# Patient Record
Sex: Male | Born: 1945 | Race: White | Hispanic: No | Smoking: Former smoker
Health system: Western US, Academic
[De-identification: ages and names within clinical notes are randomized; demographics above are authoritative.]

## PROBLEM LIST (undated history)

## (undated) DIAGNOSIS — I739 Peripheral vascular disease, unspecified: Secondary | ICD-10-CM

## (undated) DIAGNOSIS — I10 Essential (primary) hypertension: Secondary | ICD-10-CM

## (undated) DIAGNOSIS — R319 Hematuria, unspecified: Secondary | ICD-10-CM

## (undated) DIAGNOSIS — I724 Aneurysm of artery of lower extremity: Secondary | ICD-10-CM

## (undated) DIAGNOSIS — L409 Psoriasis, unspecified: Secondary | ICD-10-CM

## (undated) DIAGNOSIS — F17200 Nicotine dependence, unspecified, uncomplicated: Secondary | ICD-10-CM

## (undated) DIAGNOSIS — J309 Allergic rhinitis, unspecified: Secondary | ICD-10-CM

## (undated) DIAGNOSIS — J449 Chronic obstructive pulmonary disease, unspecified: Secondary | ICD-10-CM

## (undated) HISTORY — DX: Essential (primary) hypertension: I10

## (undated) HISTORY — DX: Psoriasis, unspecified: L40.9

## (undated) HISTORY — DX: Nicotine dependence, unspecified, uncomplicated: F17.200

## (undated) HISTORY — DX: Allergic rhinitis, unspecified: J30.9

---

## 2013-04-09 ENCOUNTER — Encounter: Payer: Self-pay | Admitting: Internal Medicine

## 2013-04-09 ENCOUNTER — Ambulatory Visit: Payer: Medicare Other | Admitting: Internal Medicine

## 2013-04-09 VITALS — BP 140/84 | HR 86 | Temp 98.4°F | Resp 16 | Ht 74.0 in | Wt 180.0 lb

## 2013-04-09 DIAGNOSIS — F17200 Nicotine dependence, unspecified, uncomplicated: Secondary | ICD-10-CM | POA: Insufficient documentation

## 2013-04-09 DIAGNOSIS — R0982 Postnasal drip: Secondary | ICD-10-CM | POA: Insufficient documentation

## 2013-04-09 DIAGNOSIS — Z13220 Encounter for screening for lipoid disorders: Secondary | ICD-10-CM

## 2013-04-09 DIAGNOSIS — K219 Gastro-esophageal reflux disease without esophagitis: Secondary | ICD-10-CM | POA: Insufficient documentation

## 2013-04-09 DIAGNOSIS — Z125 Encounter for screening for malignant neoplasm of prostate: Secondary | ICD-10-CM

## 2013-04-09 DIAGNOSIS — Z139 Encounter for screening, unspecified: Secondary | ICD-10-CM

## 2013-04-09 MED ORDER — FLUTICASONE PROPIONATE 50 MCG/ACTUATION NASAL SPRAY,SUSPENSION: 2.0000 | Freq: Every day | NASAL | Status: DC

## 2013-04-09 NOTE — Nursing Note (Signed)
>>   Kenneth Gordon     Fri Apr 09, 2013  9:15 AM  I have asked the patient if they have received any medical treatment or consultation outside of the [facility].  The patient has indicated (no) to receiving consults or services outside the [facility].  If yes, we have requested information to ascertain these results.     Patient has not been to the doctor in 20 years.      Vital signs taken, allergies verified, screened for pain, med hx taken.   Kenneth Gordon, M.A.

## 2013-04-09 NOTE — Patient Instructions (Addendum)
Gastroesophageal Reflux Disease (GERD): After Your Visit  Your Care Instructions  Gastroesophageal reflux disease (GERD) is the backward flow of stomach acid into the esophagus. The esophagus is the tube that leads from your throat to your stomach. A one-way valve prevents the stomach acid from moving up into this tube. When you have GERD, this valve does not close tightly enough.  If you have mild GERD symptoms including heartburn, you may be able to control the problem with antacids or over-the-counter medicine. Changing your diet, losing weight, and making other lifestyle changes can also help reduce symptoms.  Follow-up care is a key part of your treatment and safety. Be sure to make and go to all appointments, and call your doctor if you are having problems. It¿s also a good idea to know your test results and keep a list of the medicines you take.  How can you care for yourself at home?  · Take your medicines exactly as prescribed. Call your doctor if you think you are having a problem with your medicine.  · Your doctor may recommend over-the-counter medicine. For mild or occasional indigestion, antacids, such as Tums, Gaviscon, Mylanta, or Maalox, may help. Your doctor also may recommend over-the-counter acid reducers, such as Pepcid AC, Tagamet HB, Zantac 75, or Prilosec. Read and follow all instructions on the label. If you use these medicines often, talk with your doctor.  · Change your eating habits.  ¨ It¿s best to eat several small meals instead of two or three large meals.  ¨ After you eat, wait 2 to 3 hours before you lie down.  ¨ Chocolate, mint, and alcohol can make GERD worse.  ¨ Spicy foods, foods that have a lot of acid (like tomatoes and oranges), and coffee can make GERD symptoms worse in some people. If your symptoms are worse after you eat a certain food, you may want to stop eating that food to see if your symptoms get better.   · Do not smoke or chew tobacco. Smoking can make GERD worse. If you need help quitting, talk to your doctor about stop-smoking programs and medicines. These can increase your chances of quitting for good.  · If you have GERD symptoms at night, raise the head of your bed 6 to 8 inches by putting the frame on blocks or placing a foam wedge under the head of your mattress. (Adding extra pillows does not work.)  · Do not wear tight clothing around your middle.  · Lose weight if you need to. Losing just 5 to 10 pounds can help.  When should you call for help?  Call your doctor now or seek immediate medical care if:  · You have new or different belly pain.  · Your stools are black and tarlike or have streaks of blood.  Watch closely for changes in your health, and be sure to contact your doctor if:  · Your symptoms have not improved after 2 days.  · Food seems to catch in your throat or chest.   Where can you learn more?   Go to https://www.healthwise.net/patiented  Enter T927 in the search box to learn more about "Gastroesophageal Reflux Disease (GERD): After Your Visit."   © 2006-2013 Healthwise, Incorporated. Care instructions adapted under license by [facility]. This care instruction is for use with your licensed healthcare professional. If you have questions about a medical condition or this instruction, always ask your healthcare professional. Healthwise, Incorporated disclaims any warranty or liability for your   use of this information.  Content Version: 9.7.145117; Last Revised: June 08, 2009              Stopping Smoking: After Your Visit  Your Care Instructions  Cigarette smokers crave the nicotine in cigarettes. Giving it up is much harder than simply changing a habit. Your body has to stop craving the nicotine. It is hard to quit, but you can do it. There are many tools that people use to quit smoking. You may find that combining tools works best for you.   There are several steps to quitting. First you get ready to quit. Then you get support to help you. After that, you learn new skills and behaviors to become a nonsmoker. For many people, a necessary step is getting and using medicine.  Your doctor will help you set up the plan that best meets your needs. You may want to attend a smoking cessation program to help you quit smoking. When you choose a program, look for one that has proven success. Ask your doctor for ideas. You will greatly increase your chances of success if you take medicine as well as get counseling or join a cessation program.  Some of the changes you feel when you first quit tobacco are uncomfortable. Your body will miss the nicotine at first, and you may feel short-tempered and grumpy. You may have trouble sleeping or concentrating. Medicine can help you deal with these symptoms. You may struggle with changing your smoking habits and rituals. The last step is the tricky one: Be prepared for the smoking urge to continue for a time. This is a lot to deal with, but keep at it. You will feel better.  Follow-up care is a key part of your treatment and safety. Be sure to make and go to all appointments, and call your doctor if you are having problems. Its also a good idea to know your test results and keep a list of the medicines you take.  How can you care for yourself at home?   Ask your family, friends, and coworkers for support. You have a better chance of quitting if you have help and support.   Join a support group, such as Nicotine Anonymous, for people who are trying to quit smoking.   Consider signing up for a smoking cessation program, such as the American Lung Association's Freedom from Smoking program.   Set a quit date. Pick your date carefully so that it is not right in the middle of a big deadline or stressful time. Once you quit, do not even take a puff. Get rid of all ashtrays and lighters after your last  cigarette. Clean your house and your clothes so that they do not smell of smoke.   Learn how to be a nonsmoker. Think about ways you can avoid those things that make you reach for a cigarette.   Avoid situations that put you at greatest risk for smoking. For some people, it is hard to have a drink with friends without smoking. For others, they might skip a coffee break with coworkers who smoke.   Change your daily routine. Take a different route to work or eat a meal in a different place.   Cut down on stress. Calm yourself or release tension by doing an activity you enjoy, such as reading a book, taking a hot bath, or gardening.   Talk to your doctor or pharmacist about nicotine replacement therapy, which replaces the nicotine in your body. You  still get nicotine but you do not use tobacco. Nicotine replacement products help you slowly reduce the amount of nicotine you need. These products come in several forms, many of them available over-the-counter:   Nicotine patches   Nicotine gum and lozenges   Nicotine inhaler   Ask your doctor about bupropion (Wellbutrin) or varenicline (Chantix), which are prescription medicines. They do not contain nicotine. They help you by reducing withdrawal symptoms, such as stress and anxiety.   Some people find hypnosis, acupuncture, and massage helpful for ending the smoking habit.   Eat a healthy diet and get regular exercise. Having healthy habits will help your body move past its craving for nicotine.   Be prepared to keep trying. Most people are not successful the first few times they try to quit. Do not get mad at yourself if you smoke again. Make a list of things you learned and think about when you want to try again, such as next week, next month, or next year.   Where can you learn more?   Go to [employer]  Enter [id number] in the search box to learn more about "Stopping Smoking: After Your Visit."     2006-2013 Healthwise, Incorporated. Care instructions adapted under license by [facility]. This care instruction is for use with your licensed healthcare professional. If you have questions about a medical condition or this instruction, always ask your healthcare professional. Healthwise, Incorporated disclaims any warranty or liability for your use of this information.  Content Version: 9.7.145117; Last Revised: October 11, 2009              Prostate-Specific Antigen (PSA) Test: About This Test  What is it?  A prostate-specific antigen (PSA) test measures the amount of PSA in your blood. PSA is released by a man's prostate gland into his blood. A high PSA level may mean that you have an enlargement, infection, or cancer of the prostate.  Why is this test done?  You may have this test to:   Check for prostate cancer.   Watch prostate cancer and see if treatment is working.  How can you prepare for the test?  Do not ejaculate during the 2 days before your PSA blood test, either during sex or masturbation.  What happens before the test?  Tell your doctor if you have had a:   Test to look at your bladder (cystoscopy) in the past several weeks.   Prostate biopsy in the past several weeks.   Prostate infection or urinary tract infection that has not gone away.   Tube (catheter) inserted into your bladder to drain urine recently.  What happens during the test?  A health professional takes a sample of your blood.  What happens after the test?  You can go back to your usual activities right away.  When should you call for help?  Watch closely for changes in your health, and be sure to contact your doctor if you have any questions about this test.  Follow-up care is a key part of your treatment and safety. Be sure to make and go to all appointments, and call your doctor if you are having problems. It's also a good idea to keep a list of the medicines you take.  Ask your doctor when you can expect to have your test results.   Where can you learn more?   Go to [employer]  Enter H964 in the search box to learn more about "Prostate-Specific Antigen (PSA) Test: About This  Test."    2006-2013 Healthwise, Incorporated. Care instructions adapted under license by [facility]. This care instruction is for use with your licensed healthcare professional. If you have questions about a medical condition or this instruction, always ask your healthcare professional. Healthwise, Incorporated disclaims any warranty or liability for your use of this information.  Content Version: 9.7.145117; Last Revised: September 20, 2010

## 2013-04-09 NOTE — Progress Notes (Signed)
SUBJECTIVE: Kenneth Gordon is a [age]yr old male is here to establish medical care.  Patient has never seen a physician in last 20 years  GERD the symptoms for which he takes over the counter antacids  Upper airway syndrome post nasal drip denies any other complaints of chest pain shortness of breath or cough  Tobacco use patient is trying to quit    Constitutional: negative.  Eyes: negative.  Ears, Nose, Mouth, Throat: negative.  CV: negative.  Resp: negative.  GI: negative.  GU: negative.  Integumentary: negative.  Neuro: negative.  Psych: Mood pt's report, euthymic.  Endo: negative.  Heme/Lymphatic: negative.  Or  Past Medical History   Diagnosis Date    NO SIGNIFICANT HISTORY (aka NONE)      Past Surgical History   Procedure Laterality Date    No surgical history       Family History   Problem Relation Age of Onset    Diabetes Son      History     Social History    Marital Status: N/A     Spouse Name: N/A     Number of Children: N/A    Years of Education: N/A     Occupational History    Not on file.     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     No current facility-administered medications on file prior to visit.     No Known Allergies        OBJECTIVE:  BP 140/84   Pulse 86   Temp(Src) 36.9 C (98.4 F) (Tympanic)   Resp 16   Ht 1.88 m (6\' 2" )   Wt 81.647 kg (180 lb)   BMI 23.1 kg/m2   SpO2 97%    Normal mood and good insight, without evidence of thought disorder or hallucination  General Appearance: healthy, alert, no distress, pleasant affect, cooperative.  Ears:  normal TMs and canal and external inspection of  ears show no abnormality.  Nose:  normal.  Mouth: normal.  Neck:  Neck supple. No adenopathy, thyroid symmetric, normal size.  Heart:  normal rate and regular rhythm, no murmurs, clicks, or gallops.  Lungs: clear to auscultation.  Abdomen: BS normal.  Abdomen soft, non-tender.  No masses or organomegaly.  Extremities:  no cyanosis, clubbing, or edema.  Neuro: Gait normal. Reflexes normal and symmetric. Sensation and strength grossly normal.  Mental Status: Appearance/Cooperation: well groomed and dressed  Attitude: pleasant   Behavior :normal  Eye Contact: normal  Attention Span: good  Speech: normal volume, rate, and pitch    ASSESSMENT:(784.91) PND (post-nasal drip)  (primary encounter diagnosis)  Comment: Diagnosis reviewed  Plan: We'll start him on Flonase and discussed with him the proper technique and the pros and cons of the medications    (530.81) GERD (gastroesophageal reflux disease)  Comment: Diagnosis reviewed and discussed some lifestyle changes  Plan: CBC AUTO + REFLEX MANUAL  DIFF        When necessary antacids on PPIs    (305.1) Tobacco dependency  Comment: Encouraged the patient in consult on quitting smoking  Plan: He'll try E. cigarettes    (V82.9) Screening  Comment: for dm anemia liver and kidney    Plan: CBC AUTO + REFLEX MANUAL DIFF, COMPREHENSIVE         METABOLIC PANEL, THYROID STIMULATING HORMONE,         PSA SCREEN, GI LAB REFERRAL            (V76.44) Prostate cancer screening      (V77.91) Screening for hyperlipidemia  Comment: Patient is a family history of cardiovascular disease  Plan: LIPID PANEL

## 2013-04-13 ENCOUNTER — Ambulatory Visit: Payer: Medicare Other | Attending: Internal Medicine

## 2013-04-13 DIAGNOSIS — Z13220 Encounter for screening for lipoid disorders: Secondary | ICD-10-CM | POA: Insufficient documentation

## 2013-04-13 DIAGNOSIS — K219 Gastro-esophageal reflux disease without esophagitis: Secondary | ICD-10-CM | POA: Insufficient documentation

## 2013-04-13 DIAGNOSIS — Z139 Encounter for screening, unspecified: Principal | ICD-10-CM | POA: Insufficient documentation

## 2013-04-13 LAB — CBC WITH DIFFERENTIAL
BASOPHILS % AUTO: 0.5 %
BASOPHILS ABS AUTO: 0 10*3/uL (ref 0–0.2)
EOSINOPHIL % AUTO: 2.4 %
EOSINOPHIL ABS AUTO: 0.2 10*3/uL (ref 0–0.5)
HEMATOCRIT: 48.9 % (ref 41–53)
HEMOGLOBIN: 16 g/dL (ref 13.5–17.5)
LYMPHOCYTE ABS AUTO: 1.9 10*3/uL (ref 1.0–4.8)
LYMPHOCYTES % AUTO: 21.9 %
MCH: 31.3 pg (ref 27–33)
MCHC: 32.8 % (ref 32–36)
MCV: 95.5 UM3 (ref 80–100)
MONOCYTES % AUTO: 8.4 %
MONOCYTES ABS AUTO: 0.7 10*3/uL (ref 0.1–0.8)
MPV: 8.2 UM3 (ref 6.8–10.0)
NEUTROPHIL ABS AUTO: 5.8 10*3/uL (ref 1.80–7.70)
NEUTROPHILS % AUTO: 66.8 %
PLATELET COUNT: 235 10*3/uL (ref 130–400)
RDW: 13.9 U (ref 0–14.7)
RED CELL COUNT: 5.12 10*6/uL (ref 4.5–5.9)
WHITE BLOOD CELL COUNT: 8.8 10*3/uL (ref 4.5–11.0)

## 2013-04-13 LAB — COMPREHENSIVE METABOLIC PANEL
ALANINE TRANSFERASE (ALT): 25 U/L (ref 6–63)
ALBUMIN: 3.5 g/dL (ref 3.2–4.6)
ALKALINE PHOSPHATASE (ALP): 69 U/L (ref 35–115)
ASPARTATE TRANSAMINASE (AST): 21 U/L (ref 15–43)
BILIRUBIN TOTAL: 0.5 mg/dL (ref 0.3–1.3)
CALCIUM: 9.3 mg/dL (ref 8.6–10.5)
CARBON DIOXIDE TOTAL: 34 meq/L — AB (ref 24–32)
CHLORIDE: 102 meq/L (ref 95–110)
CREATININE BLOOD: 0.85 mg/dL (ref 0.44–1.27)
GLUCOSE: 104 mg/dL — AB (ref 70–99)
POTASSIUM: 5 meq/L (ref 3.3–5.0)
PROTEIN: 6.6 g/dL (ref 6.3–8.3)
SODIUM: 142 meq/L (ref 135–145)
UREA NITROGEN, BLOOD (BUN): 11 mg/dL (ref 8–22)

## 2013-04-13 LAB — LIPID PANEL
CHOLESTEROL: 186 mg/dL (ref 0–200)
HDL CHOLESTEROL: 51 mg/dL (ref >=35)
LDL CHOLESTEROL CALCULATION: 117 mg/dL (ref <130)
NON-HDL CHOLESTEROL: 135 mg/dL (ref 0–160)
TOTAL CHOLESTEROL:HDL RATIO: 3.7 (ref <4.0)
TRIGLYCERIDE: 90 mg/dL (ref 35–160)

## 2013-04-13 LAB — THYROID STIMULATING HORMONE: THYROID STIMULATING HORMONE: 5.75 u[IU]/mL — AB (ref 0.35–3.30)

## 2013-04-13 LAB — PSA SCREEN: PSA SCREEN: 2.7 ng/mL (ref 0–4.0)

## 2013-04-19 ENCOUNTER — Encounter: Payer: Self-pay | Admitting: Internal Medicine

## 2013-04-19 NOTE — Telephone Encounter (Signed)
From: Kenneth Gordon  To: [name], MD  Sent: 04/19/2013 12:26 PM PST  Subject:  Non-urgent Medical Advice Question    Now  that my test results are in should I schedule a follow up appointment?    Thanks  in Advance.    Kenneth Puffobert  Gordon

## 2013-12-19 ENCOUNTER — Other Ambulatory Visit: Payer: Self-pay

## 2014-12-29 ENCOUNTER — Ambulatory Visit: Payer: Medicare Other

## 2014-12-29 VITALS — BP 150/88 | HR 84 | Temp 97.0°F | Ht 74.0 in | Wt 183.0 lb

## 2014-12-29 DIAGNOSIS — Z125 Encounter for screening for malignant neoplasm of prostate: Principal | ICD-10-CM

## 2014-12-29 DIAGNOSIS — K219 Gastro-esophageal reflux disease without esophagitis: Secondary | ICD-10-CM

## 2014-12-29 DIAGNOSIS — Z139 Encounter for screening, unspecified: Secondary | ICD-10-CM

## 2014-12-29 DIAGNOSIS — Z23 Encounter for immunization: Secondary | ICD-10-CM

## 2014-12-29 NOTE — Nursing Note (Signed)
Patient ID confirmed using 2 identifiers.    Vital signs taken, allergies verified, screened for pain, and med history taken.      Mammography screening for females between 50-68 years of age reviewed as appropriate.    Flu Vaccine status reviewed and updated as appropriate.     My Chart status reviewed and updated as appropriate.    I have asked the patient if they have received any medical treatment/consultations outside of the [facility] within the past 30 days. The patient has indicated no to receiving services outside of the [facility]. If yes, I have requested information to ascertain these results.     Jeanette McCrum, MA

## 2014-12-29 NOTE — Nursing Note (Signed)
The Influenza Vaccine VIS document for the flu injection was given to patient to review. Patient or person named in permission has answered no to any history of egg allergy, previous serious reaction to a influenza vaccine or current illness which would preclude them receiving an immunization. Any questions were referred to the physician. The Influenza Vaccine was then administered per protocol. The patient was observed for immediate reactions to the vaccine per protocol. None were observed.   Kenneth Gordon       Immunization VIS documentation(s) were given to patient  to review. All questions were answered and the patient consented to the Immunization(s) being given. Patient allergies were reviewed and no contraindications were found. The immunization(s) were given as ordered. The patient was observed for any immediate reactions to the vaccine. None were observed.ABN Done. Kenneth Gordon

## 2014-12-29 NOTE — Patient Instructions (Signed)
- - - - - - - - - - - - - - - Information on Prostate Cancer - - - - - - - - - - - - - - -    The prostate is a gland, about the size of a walnut, located under the bladder and surrounding the upper part of the urethra. The urethra is a tube that carries urine and semen through the penis to the outside of the body. The prostate gland is found only in men. It produces semen, the liquid that carries the sperm when a man ejaculates.    Prostate cancer starts in the cells of the prostate gland. When a man has prostate cancer, the prostate cancer cells grow into a tumor. These cancer cells can also spread into other parts of the body.    The most common symptom of prostate cancer is no symptom at all.     Risk Factors and Prevention:    While 1 man out of 6 will be diagnosed with prostate cancer during his lifetime, only 1 man in 34 will die of this disease. The death rate for prostate cancer is going down. And the disease is being found earlier as well.     Prostate cancer is a disease that is often associated with aging. About 75 percent of men are aged 65 years and older when diagnosed with prostate cancer.     African American men are at especially high risk for prostate cancer: They are over 50 percent more likely to develop this disease than non-Hispanic white men. African American men are twice as likely to die of this disease. Prostate cancer occurs less often in Asian men than in whites.    Men with close family members (father or brother) who have had prostate cancer are more likely to get it themselves, especially if their relatives were young when they got the disease.     In [place], approximately 18,865 men will be diagnosed with prostate cancer in 2007, 3,010 men will die from it and 114,600 men will continue living with it.     Because we don't know the exact cause of prostate cancer, it is not possible to give good advice about prevention. Some experts think that diet can reduce the risk of  developing prostate cancer by eating less red meat and fat, and eating more fruits, vegetables and grains.    Prostate Cancer Screening Options:    Prostate cancer can often be found early by testing the amount of Prostate-Specific Antigen (PSA) in a man's blood. PSA is a substance made by the normal prostate gland. Although PSA is mostly found in semen, a small amount is also found in the blood. There is no question that the PSA test can help spot prostate cancer. But it can't tell how dangerous the cancer is.    Another way to find prostate cancer is the Digital Rectal Exam (DRE). To do the DRE, the doctor inserts a gloved, lubricated finger into the rectum to feel for any irregular or firm areas that might be cancer. The DRE is less effective than the PSA blood test in finding prostate cancer, but it can sometimes find cancers in men with normal PSA levels. For this reason, the American Cancer Society (ACS) recommends that when prostate cancer screening is done, both the DRE and the PSA should be used.     These tests are not always accurate, however. Wrong test results could lead to excess worry, or even an   unneeded biopsy or other tests. Until more is known, one should talk to his doctor about whether or not he needs to be tested. Things to take into account are age and health. If one is young and gets prostate cancer, it will probably shorten his life if it is not caught early. But if one is older or in poor health, then prostate cancer may never become a major problem because it often grows so slowly.     ACS believes that doctors should offer the PSA blood test and DRE yearly, beginning at age 50 to men who do not have any major medical problems and can be expected to live at least 10 more years. Men at high risk should begin testing at age 45. Men at high risk include African Americans and men who have a close relative who had prostate cancer before age 65. Men at even higher risk (have several close  relatives with prostate cancer at an early age) could begin testing at age 40.     If certain symptoms or the results of early tests suggest one might have prostate cancer, the doctor will use further tests to find out whether the disease is present.    The prostate biopsy is the only way to know for sure if a man has prostate cancer. During a biopsy, tissue from a man's prostate is removed so it can be sent to the lab to see if there are cancer cells. A core needle biopsy is the main method used.    Cancer Treatment:    Most of the time, prostate cancer grows slowly. Autopsy studies show that many older men who died of other diseases also had prostate cancer that neither they nor their doctor were aware of. But sometimes prostate cancer can grow and spread quickly. Even with the latest methods, it is hard to tell which prostate cancers will grow slowly and which will grow quickly.     If cancer is present, the biopsy sample will be graded. Grading the cancer helps to predict how fast the cancer is likely to grow and spread. A staging system is a way to describe the extent to which the cancer has spread.    Surgery, radiation, and hormone therapy are the most common treatments for prostate cancer. Chemotherapy may be used in some cases, and watchful waiting, though not an active form of treatment, may also be used.    There are risks and significant side effects with all forms of treatment for prostate cancer. They include impotence, sterility, and incontinence.    Several factors should be taken into account before one chooses a course of treatment. These factors include age, overall health, goals for treatment, and one's feelings about side effects, risks and complications of treatment.. Some men, for example, can't imagine living with side effects such as incontinence or impotence. Others are less concerned about these and more concerned about getting rid of the cancer.  It is often helpful to discuss treatment  options with more than one doctor. Many men find that talking to others who have faced the same issues is helpful.    Prostate Cancer Survival Rates:    Overall, 99 percent of men diagnosed with prostate cancer survive at least 5 years. Ninety one percent of all prostate cancers are found while they are still within the prostate or only in nearby areas. Nearly 100 percent of these men survive at least 5 years. For the men whose cancer has already spread to   distant parts of the body when it is found, 34 percent will survive at least 5 years.    Sources:  1. American Cancer Society, Overview: Prostate Cancer. Available @ www.cancer.org. Accessed:  January 2007  2. IMPACT, What Every Man Should Know About Prostate Cancer? Brochure.  Available @ http://www.[place]-impact.org/public/genlbro.pdf . Accessed: January 2007.  3. American Cancer Society, [place] Division, and Public health Institute, [place] Cancer  Registry, [place] Cancer Facts & Figures 2007, [place], [place]. September 2006

## 2014-12-29 NOTE — Progress Notes (Signed)
Kenneth Gordon  presents for prostate check .  His check was 1 year(s) ago. In general he has been feeling well and functioning well at home, work, and personal relationships.Marland KitchenHe is still smoking but has cut back he is not interested in setting quit date.     Prostate exams have been normal.     Immunization History   Administered Date(s) Administered    (Prevnar 13; PCV13) Pneumococcal Vaccine, Conjugate 12/29/2014    Influenza Vaccine, Quadrivalent (Fluzone/Fluarix) 12/29/2014    Influenza Virus Vaccine, Split (0.5 Ml) 10/23/2012       Family history is significant for unchanged    Sexual History: is heterosexual and monogamous    Social history: I  did review and update substance use history form.  His routine exercise includes walking swimming , and his diet is typical American.   CAGE questions positive for no to all    Safety issues:  The patient does routinely use seatbelts.  He does  have smoke detectors, with biannual power checks, in his house. .     ROS:  He denies constitutional symptoms of fatigue, weakness, weight loss or gain, fevers, night sweats. The patient denies changes in visual acuity, diplopia or amaurosis, no discharge, matting, redness, tearing or eye pain. He denies a history of hearing loss, ear pain, tinnitus or aural discharge. Patient denies sore throat, dental pain, hoarseness, dysphagia, oral or tongue lesions. No symptoms of hypo or hyperthyroidism: no decreased or increased weight, no feeling cold/chilly or excessively warm, no diarrhea or constipation, no undue sweatiness, anxiety or palpitations. The patient denies cough, chest pain, dyspnea, wheezing or hemoptysis. Patient denies any exertional chest pain, dyspnea, palpitations, syncope, orthopnea, edema or paroxysmal nocturnal dyspnea. The patient denies abdominal or flank pain, anorexia, nausea or vomiting, dysphagia, change in bowel habits or black or bloody stools or weight loss. He denies urethral discharge,  dysuria, hematuria or sores on the genitals. No lumps or pain in the testicles. The patient denies urinary hesitancy, nocturia, frequency or incomplete voiding. He has noted no swollen glands in neck, axillary or inguinal areas, no fevers, weight loss, night sweats or pruritis. There is no family history of lymphoreticular neoplasia. The patient denies any symptoms of neurological impairment or TIA's; no amaurosis, diplopia, dysphasia, or unilateral disturbance of motor or sensory function. No loss of balance or vertigo. No personal or family history of skin cancers or melanoma.     Exam. BP 150/88  Pulse 84  Temp 36.1 C (97 F) (Tympanic)  Ht 1.88 m ( )  Wt 83 kg (183 lb)  SpO2 97%  BMI 23.5 kg/m2   General Appearance: healthy, alert, no distress, pleasant affect, cooperative.  Eyes:  conjunctivae and corneas clear. PERRL, EOM's intact. sclerae normal.  Ears:  normal TMs and canal and external inspection of ears show no abnormality.  Nose:  normal.  Mouth: normal.  Neck:  Neck supple. No adenopathy, thyroid symmetric, normal size.  Heart:  normal rate and regular rhythm, no murmurs, clicks, or gallops.  Lungs: clear to auscultation.  Breast:  negative.  Abdomen: BS normal.  Abdomen soft, non-tender.  No masses or organomegaly.  Extremities:  no cyanosis, clubbing, or edema.  Skin:  Skin color, texture, turgor normal. No rashes or lesions.  Genital Exam: Penis normal. No urethral discharge. Scrotum normal to palpation. No hernia.  Rectal: negative, stool guaiac negative and prostate 1+.nontender no nodule   Neuro: Gait normal. Reflexes normal and symmetric. Sensation and strength grossly  normal.  Mental Status: PHQ 2 is 0   ASSESSMENT:   (Z12.5) Prostate cancer screening  (primary encounter diagnosis)  Comment: DRE with mild BPH   Plan: PSA SCREEN        The natural history of prostate cancer and ongoing controversy regarding screening and potential treatment outcomes of prostate cancer has been discussed with  the patient. The meaning of a false positive PSA and a false negative PSA has been discussed. He indicates understanding of the limitations of this screening test and wishes  to proceed with screening PSA testing.     (Z23) Need for pneumococcal vaccination  Comment: update   Plan: PNEUMOCOCCAL CONJ VACCINE 13 VALENT IM        With this seasons influenza     (K21.9) Gastroesophageal reflux disease without esophagitis  Comment: quiescent off medication   Plan: CBC WITH DIFFERENTIAL        Follow up reucrrent     (Z13.9) Screening  Comment: glucose and lipids   Plan: LIPID PANEL WITH DLDL REFLEX, COMPREHENSIVE         METABOLIC PANEL, CBC WITH DIFFERENTIAL, TSH         WITH FREE T4 REFLEX        Per above   It is very important that he quit smoking. There are various alternative available to help with this difficult task, but first and foremost, he must make a firm commitment and decision to quit. The nature of nicotine addiction is discussed. The usefulness of behavioral therapy is discussed and suggested.  The correct use, cost and side effects of nicotine replacement therapy such as gum or patches is discussed. Zyban and it's cost and side effects are reviewed. The quit rates are discussed. I recommend he not allow costs of treatment to deter him from using NRT or Zyban, as the long term economic and health benefits are obvious. chantix discussed   I did review patient's past medical and family/social history.  Barriers to Learning assessed: none. Patient verbalizes understanding of teaching and instructions.    Electronically signed by   Ivan Anchors. Malie Kashani MD   Associate Medical Director  [facility], [facility]  [address]  [place], [address]  [phone]

## 2014-12-30 ENCOUNTER — Ambulatory Visit: Payer: Medicare Other

## 2014-12-30 DIAGNOSIS — K219 Gastro-esophageal reflux disease without esophagitis: Secondary | ICD-10-CM | POA: Insufficient documentation

## 2014-12-30 DIAGNOSIS — Z125 Encounter for screening for malignant neoplasm of prostate: Principal | ICD-10-CM | POA: Insufficient documentation

## 2014-12-30 DIAGNOSIS — Z139 Encounter for screening, unspecified: Secondary | ICD-10-CM | POA: Insufficient documentation

## 2014-12-30 LAB — CBC WITH DIFFERENTIAL
BASOPHILS % AUTO: 0.6 %
BASOPHILS ABS AUTO: 0 10*3/uL (ref 0–0.2)
EOSINOPHIL % AUTO: 2 %
EOSINOPHIL ABS AUTO: 0.2 10*3/uL (ref 0–0.5)
HEMATOCRIT: 49.6 % (ref 41–53)
HEMOGLOBIN: 16.5 g/dL (ref 13.5–17.5)
LYMPHOCYTE ABS AUTO: 1.1 10*3/uL (ref 1.0–4.8)
LYMPHOCYTES % AUTO: 13.6 %
MCH: 31.2 pg (ref 27–33)
MCHC: 33.3 % (ref 32–36)
MCV: 93.8 UM3 (ref 80–100)
MONOCYTES % AUTO: 8.2 %
MONOCYTES ABS AUTO: 0.7 10*3/uL (ref 0.1–0.8)
MPV: 8.2 UM3 (ref 6.8–10.0)
NEUTROPHIL ABS AUTO: 6.2 10*3/uL (ref 1.80–7.70)
NEUTROPHILS % AUTO: 75.6 %
PLATELET COUNT: 197 10*3/uL (ref 130–400)
RDW: 13.9 U (ref 0–14.7)
RED CELL COUNT: 5.29 10*6/uL (ref 4.5–5.9)
WHITE BLOOD CELL COUNT: 8.2 10*3/uL (ref 4.5–11.0)

## 2014-12-30 LAB — COMPREHENSIVE METABOLIC PANEL
ALANINE TRANSFERASE (ALT): 16 U/L (ref 6–63)
ALBUMIN: 3.8 g/dL (ref 3.2–4.6)
ALKALINE PHOSPHATASE (ALP): 49 U/L (ref 35–115)
ASPARTATE TRANSAMINASE (AST): 20 U/L (ref 15–43)
BILIRUBIN TOTAL: 0.9 mg/dL (ref 0.3–1.3)
CALCIUM: 8.8 mg/dL (ref 8.6–10.5)
CARBON DIOXIDE TOTAL: 30 meq/L (ref 24–32)
CHLORIDE: 100 meq/L (ref 95–110)
CREATININE BLOOD: 0.97 mg/dL (ref 0.44–1.27)
GLUCOSE: 89 mg/dL (ref 70–99)
POTASSIUM: 4.3 meq/L (ref 3.3–5.0)
PROTEIN: 6.5 g/dL (ref 6.3–8.3)
SODIUM: 139 meq/L (ref 135–145)
UREA NITROGEN, BLOOD (BUN): 17 mg/dL (ref 8–22)

## 2014-12-30 LAB — LIPID PANEL WITH DLDL REFLEX
CHOLESTEROL: 182 mg/dL (ref 0–200)
HDL CHOLESTEROL: 44 mg/dL (ref >=35)
LDL CHOLESTEROL CALCULATION: 123 mg/dL (ref <130)
NON-HDL CHOLESTEROL: 138 mg/dL (ref 0–160)
TOTAL CHOLESTEROL:HDL RATIO: 4.1 — AB (ref <4.0)
TRIGLYCERIDE: 77 mg/dL (ref 35–160)

## 2014-12-30 LAB — TSH WITH FREE T4 REFLEX: THYROID STIMULATING HORMONE: 3.73 u[IU]/mL — AB (ref 0.35–3.30)

## 2014-12-30 LAB — THYROXINE, FREE (FREE T4): THYROXINE, FREE (FREE T4): 0.91 ng/dL (ref 0.56–1.64)

## 2014-12-30 LAB — PSA SCREEN: PSA SCREEN: 0.6 ng/mL (ref 0–4.0)

## 2015-03-02 DIAGNOSIS — Z1211 Encounter for screening for malignant neoplasm of colon: Principal | ICD-10-CM

## 2015-03-02 NOTE — Telephone Encounter (Signed)
From: Renard Hamperobert G Batts  To: Charline BillsPaul Grant Seites, MD  Sent: 03/02/2015 8:46 AM PST  Subject: Non-urgent Medical Advice Question    Hi Dr. Caryl AdaSeites    My significant other wants me to schedule and upper GI Test and colonoscopy as a base line. Can I schedule one for January 12-19, 2017?

## 2016-04-18 ENCOUNTER — Ambulatory Visit
Admission: RE | Admit: 2016-04-18 | Discharge: 2016-04-18 | Disposition: A | Discharge status: Home / Self Care | Payer: Medicare Other | Source: Ambulatory Visit | Attending: Family Medicine | Admitting: Family Medicine

## 2016-04-18 ENCOUNTER — Encounter: Payer: Self-pay | Admitting: Family Medicine

## 2016-04-18 ENCOUNTER — Ambulatory Visit (INDEPENDENT_AMBULATORY_CARE_PROVIDER_SITE_OTHER)
Admission: RE | Admit: 2016-04-18 | Discharge: 2016-04-18 | Disposition: A | Discharge status: Home / Self Care | Payer: Medicare Other | Source: Ambulatory Visit | Attending: Family Medicine | Admitting: Family Medicine

## 2016-04-18 ENCOUNTER — Ambulatory Visit (INDEPENDENT_AMBULATORY_CARE_PROVIDER_SITE_OTHER): Payer: Medicare Other | Admitting: Family Medicine

## 2016-04-18 VITALS — BP 146/76 | HR 94 | Temp 97.4°F | Resp 20 | Wt 185.6 lb

## 2016-04-18 DIAGNOSIS — F17200 Nicotine dependence, unspecified, uncomplicated: Principal | ICD-10-CM

## 2016-04-18 DIAGNOSIS — I10 Essential (primary) hypertension: Principal | ICD-10-CM

## 2016-04-18 DIAGNOSIS — Z23 Encounter for immunization: Secondary | ICD-10-CM

## 2016-04-18 DIAGNOSIS — L409 Psoriasis, unspecified: Secondary | ICD-10-CM

## 2016-04-18 DIAGNOSIS — J3089 Other allergic rhinitis: Secondary | ICD-10-CM

## 2016-04-18 DIAGNOSIS — R2242 Localized swelling, mass and lump, left lower limb: Principal | ICD-10-CM

## 2016-04-18 DIAGNOSIS — R0609 Other forms of dyspnea: Secondary | ICD-10-CM

## 2016-04-18 DIAGNOSIS — I724 Aneurysm of artery of lower extremity: Secondary | ICD-10-CM

## 2016-04-18 MED ORDER — LISINOPRIL 20 MG TABLET
20.0000 mg | ORAL_TABLET | Freq: Every day | ORAL | 1 refills | Status: DC
Start: 2016-04-18 — End: 2016-05-20

## 2016-04-18 MED ORDER — CLOBETASOL 0.05 % TOPICAL OINTMENT
TOPICAL_OINTMENT | TOPICAL | 2 refills | Status: DC
Start: 2016-04-18 — End: 2017-05-09

## 2016-04-18 NOTE — Progress Notes (Signed)
Chief Complaint: Follow up blood pressure; establish care    Renard HamperRobert G Nathanson is a 71yr old male with history of tobacco dependence, allergic rhinitis and psoriasis who presents to establish care with me and discuss elevated blood pressures. He has been keeping track with a phone app and his readings have been high. Denies HA, palpitations, CP but does endorse DOE recently when going up stairs.  Also for at least the last 6 months he has had a mass grow behind his left knee with surrounding swelling. No pain. No trauma to the area.      REVIEW OF SYSTEMS:  Constitutional: negative.  Ears, Nose, Mouth, Throat: post nasal drip, rhinorrhea.  CV: lower extremity edema.  Resp: cough, dyspnea on exertion.  GI: negative.  GU: negative.  Musculoskeletal: left popliteal mass.  Neuro: negative.    I did review patient's past medical and family/social history, no changes noted.    CURRENT MEDICATIONS:  No current outpatient prescriptions on file.     No current facility-administered medications for this visit.        PHYSICAL EXAM:  BP 146/76  Pulse 94  Temp 36.3 C (97.4 F) (Tympanic)  Resp 20  Wt 84.2 kg (185 lb 10 oz)  SpO2 97%  BMI 23.83 kg/m2  General Appearance: healthy, alert, no distress, pleasant affect, cooperative.  Neck:  Neck supple. No adenopathy, thyroid symmetric, normal size.  Heart:  normal rate and regular rhythm, no murmurs, clicks, or gallops.  Lungs: clear to auscultation.  Extremities:  no cyanosis, clubbing, or edema.  Musculoskeletal: Firm, immobile mass left popliteal fossa; nontender; surrounding varicosities; no erythema; trace local edema   Skin: multiple erythematous plaques with silver scale on skin of knees, thighs, legs, buttocks, low back; mild involvement behind ears.    ASSESSMENT & PLAN:  (I10) Essential hypertension  (primary encounter diagnosis)  Comment: New dx  Plan: Rx Lisinopril 40mg  (0.5 tab daily) with option to increase to 1 tab daily.    (J30.89) Chronic non-seasonal  allergic rhinitis, unspecified trigger  Comment: Using Dym[place] from [place]  Plan: Nasal steroid technique demonstrated; nasal irrigation handout given    (F17.200) Tobacco dependence  Comment: 1/2 ppd  Plan: DX CHEST 2 VIEWS        In light of DOE will get CXR    (L40.9) Psoriasis  Comment: Using coal tar at this time; acute flare  Plan: Clobetasol (TEMOVATE) 0.05 % Ointment        Rx temovate    (R06.09) Dyspnea on exertion  Comment: New onset in a chronic smoker; likely COPD dx  Plan: DX CHEST 2 VIEWS        CXR    (R22.42) Mass of left lower leg  Comment: Popliteal  Plan: US SOFT TISSUE LOWER EXTREMITY LEFT        Will get US of the area initially          Barriers to Learning assessed: none. Patient verbalizes understanding of teaching and instructions.

## 2016-04-18 NOTE — Nursing Note (Signed)
The Influenza Vaccine VIS document for the flu injection was given to patient to review. Patient or person named in permission has answered no to any history of egg allergy, previous serious reaction to a influenza vaccine or current illness which would preclude them receiving an immunization. Any questions were referred to the physician. The Influenza Vaccine was then administered per protocol. The patient was observed for immediate reactions to the vaccine per protocol. None were observed.   Kenneth Gordon

## 2016-04-18 NOTE — Nursing Note (Signed)
Patient ID confirmed using 2 identifiers.    Vital signs taken, allergies verified, screened for pain, and med history taken.     Mammography screening for females between 50-70 years of age reviewed as appropriate.    Flu Vaccine status reviewed and updated as appropriate.      My Chart status reviewed and updated as appropriate.    I have asked the patient if they have received any medical treatment/consultations outside of the [facility] within the past 30 days.  The patient has indicated no to receiving services outside of the [facility].  If yes, I have requested information to ascertain these results

## 2016-04-18 NOTE — Addendum Note (Signed)
Addended by: Orlena SheldonACTON, Brentley Horrell on: 04/18/2016 01:32 PM     Modules accepted: Orders

## 2016-04-19 ENCOUNTER — Ambulatory Visit
Admission: RE | Admit: 2016-04-19 | Discharge: 2016-04-19 | Disposition: A | Discharge status: Home / Self Care | Payer: Medicare Other | Source: Ambulatory Visit | Attending: Vascular Surgery | Admitting: Vascular Surgery

## 2016-04-19 DIAGNOSIS — I724 Aneurysm of artery of lower extremity: Secondary | ICD-10-CM

## 2016-04-19 DIAGNOSIS — R2242 Localized swelling, mass and lump, left lower limb: Principal | ICD-10-CM | POA: Insufficient documentation

## 2016-04-19 DIAGNOSIS — I70212 Atherosclerosis of native arteries of extremities with intermittent claudication, left leg: Secondary | ICD-10-CM

## 2016-04-19 NOTE — Addendum Note (Signed)
Addended by: Orlena SheldonACTON, Nico Syme on: 04/19/2016 10:50 AM     Modules accepted: Orders

## 2016-04-22 ENCOUNTER — Encounter: Payer: Self-pay | Admitting: GEN VASCULAR SURG

## 2016-04-22 ENCOUNTER — Encounter: Payer: Self-pay | Admitting: Family Medicine

## 2016-04-22 ENCOUNTER — Ambulatory Visit: Payer: Medicare Other | Attending: GEN VASCULAR SURG | Admitting: GEN VASCULAR SURG

## 2016-04-22 VITALS — BP 146/77 | HR 96 | Resp 18 | Wt 182.3 lb

## 2016-04-22 DIAGNOSIS — Z720 Tobacco use: Secondary | ICD-10-CM | POA: Insufficient documentation

## 2016-04-22 DIAGNOSIS — I724 Aneurysm of artery of lower extremity: Secondary | ICD-10-CM | POA: Insufficient documentation

## 2016-04-22 DIAGNOSIS — F17200 Nicotine dependence, unspecified, uncomplicated: Secondary | ICD-10-CM | POA: Insufficient documentation

## 2016-04-22 NOTE — Nursing Note (Signed)
Vital signs taken, allergies verified, screened for pain,  Salvadore Valvano, MA

## 2016-04-22 NOTE — Progress Notes (Signed)
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 He has had a chronic left popliteal fossae mass for several years and recently requested evaluation of this mass. 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    He has not had any episodes of sudden onset lower extremity pain. No calf or leg pain with ambulation. No numbness or weakness of the distal left leg.  He does have fairly significant COPD and is out of breath just walking in from the waiting area; this improved with his inhaler.    Current smoker 2 ppd x 50-55 yrs = 100 pack years.    History of psoriasis  Denies family history of aneurysm.    Review of Systems   Constitution: Negative for chills, decreased appetite, fever, weakness, malaise/fatigue, weight gain and weight loss.   HENT: Negative for congestion, headaches, hearing loss and sore throat.    Eyes: Negative for blurred vision, discharge, double vision, redness, vision loss in left eye, vision loss in right eye and visual disturbance.   Cardiovascular: Positive for dyspnea on exertion. Negative for chest pain, claudication, orthopnea and syncope.   Respiratory: Positive for cough, shortness of breath, sputum production and wheezing. Negative for hemoptysis.    Endocrine: Negative for cold intolerance and heat intolerance.   Skin: Positive for dry skin, flushing, itching and rash.   Musculoskeletal: Positive for joint pain. Negative for arthritis, back pain, muscle cramps and muscle weakness.   Gastrointestinal: Positive for constipation. Negative for bloating, abdominal pain, anorexia, change in bowel habit, bowel incontinence, diarrhea,  dysphagia, excessive appetite, heartburn, hematemesis, hematochezia, melena, nausea and vomiting.   Genitourinary: Negative for bladder incontinence, dysuria, frequency, hematuria, hesitancy and urgency.   Neurological: Negative for brief paralysis, focal weakness, numbness, seizures and sensory change.         PMHx/PSHx:  Past Medical History:   Diagnosis Date    Allergic rhinitis     Essential hypertension     NO SIGNIFICANT HISTORY (aka NONE)     Psoriasis     Tobacco dependence        Past Surgical History:   Procedure Laterality Date    NO SURGICAL HISTORY         Meds:  Current Outpatient Prescriptions   Medication Sig Dispense Refill    Clobetasol (TEMOVATE) 0.05 % Ointment Use sparingly twice a day to affected area. 60 g 2    Lisinopril (PRINIVIL, ZESTRIL) 20 mg Tablet Take 1 tablet by mouth every day. (blood pressure) 90 tablet 1     No current facility-administered medications for this visit.        Allergies:  Review of patient's allergies indicates no known allergies.    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   Cardiac:  Regular rhythm.  Lungs:  Bilateral expiratory wheezes.   Abdomen:  Soft. Non-tender. Non-distended. No palpable masses.  Extremities: BLE No edema or wounds.  Mild pre-tibial hyperpigmentation. Full ROM, sensation and 5/5 strength in UE and LE.  Ambulates without difficulty.   Groin: No rashes or lesions or scars.  Vascular Exam: Radial Femoral Popliteal PT DP ABI   RIGHT 2 Normal 2 Normal  No Bruit 2 Normal 2 Normal 2 Normal 1.15   LEFT 2 Normal 2 Normal  No Bruit 2 Normal 0 Absent, Doppler Signal:  yes 0 Absent, Doppler Signal:  yes 0.85        STUDIES  BLE arterial duplex US 04/19/16   Impression:  Patent supra and infra-renal aorta without evidence of aneurysm. The bilateral iliac arteries are not seen on today's study.  Patent bilateral common femoral arteries that are not aneurysmal.  Patent right popliteal artery without aneurysm.  >50% stenosis of the left distal superficial femoral artery  6 cm left popliteal artery aneurysm.  Normal arterial perfusion to the bilateral lower extremities at rest.    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 No claudication or distal sensorimotor symptoms, but reduced ABI and absent pulses.    Repair is indicated based on size and reduced ABI. Will obtain more detailed imaging with CTA and evaluate adequacy of veins for bypass conduit.    Notable medical issues include active smoking and significant COPD.     1. BMP  2. Smoking cessation referral  3. LLE CTA, BLE duplex US vein mapping.  4. Return to clinic 1 week (or this Friday) after CTA and US.  5. Patient seen and examined with the attending physician who is in agreement with the treatment plan.     Joelyn Omsonnor Keante Urizar, MD  Vascular Surgery Integrated Residency, PGY-3  PI# [phone], pager [phone]  Vascular Surgery service pager: [phone]

## 2016-04-22 NOTE — Telephone Encounter (Signed)
From: Kenneth Gordon G Chipley  To: [name], DO  Sent: 04/20/2016 11:03 AM PST  Subject: Test Result Question     What were the results of my test so far and why do I have an appointment on Monday with pulmonary clinic    Thanks in advance    [facility] LL[facility]t Gordon

## 2016-04-23 ENCOUNTER — Ambulatory Visit (HOSPITAL_BASED_OUTPATIENT_CLINIC_OR_DEPARTMENT_OTHER)
Admission: RE | Admit: 2016-04-23 | Discharge: 2016-04-23 | Disposition: A | Discharge status: Home / Self Care | Payer: Medicare Other | Source: Ambulatory Visit | Attending: GEN VASCULAR SURG | Admitting: GEN VASCULAR SURG

## 2016-04-23 ENCOUNTER — Ambulatory Visit
Admission: RE | Admit: 2016-04-23 | Discharge: 2016-04-23 | Disposition: A | Discharge status: Home / Self Care | Payer: Medicare Other | Source: Ambulatory Visit | Attending: GEN VASCULAR SURG | Admitting: GEN VASCULAR SURG

## 2016-04-23 DIAGNOSIS — I724 Aneurysm of artery of lower extremity: Principal | ICD-10-CM | POA: Insufficient documentation

## 2016-04-23 DIAGNOSIS — I82432 Acute embolism and thrombosis of left popliteal vein: Secondary | ICD-10-CM

## 2016-04-23 DIAGNOSIS — Z01810 Encounter for preprocedural cardiovascular examination: Secondary | ICD-10-CM

## 2016-04-23 MED ORDER — IOHEXOL 350 MG IODINE/ML INTRAVENOUS SOLUTION
125.0000 mL | INTRAVENOUS | Status: AC
Start: 2016-04-23 — End: 2016-04-23
  Administered 2016-04-23: 125 mL via INTRAVENOUS

## 2016-04-24 ENCOUNTER — Encounter: Payer: Self-pay | Admitting: Family Medicine

## 2016-04-24 NOTE — Telephone Encounter (Signed)
From: Renard Hamperobert G Bagby  To: [name], DO  Sent: 04/24/2016 10:08 AM PST  Subject: Visit Follow-up Question    Dr. Derrek GuActon    Well, it has been a week since I have started taking my blood pressure medication and it is still high. All week long it has been in the range of 140/80 and this morning it was higher than usual. I was wondering it I should increase my dosage of blood pressure medication to 2- 20MG  tablets.    I really can't go out and have great difficulty breathing when I do short walk (100 ft). It has made it almost impossible to make my Doctors/Lab appointments. In the short term I have my son take me to the doctor and wheel me in to see him. Could you write me a prescription for a portable O2 unit. The last two appointments I have had [facility]. If you have any question please feel free to call me.    Thanks  Joanna Puffobert Grissinger

## 2016-04-26 ENCOUNTER — Ambulatory Visit: Payer: Medicare Other

## 2016-04-26 ENCOUNTER — Encounter: Payer: Self-pay | Admitting: Family Medicine

## 2016-04-26 ENCOUNTER — Ambulatory Visit: Payer: Medicare Other | Attending: GEN VASCULAR SURG

## 2016-04-26 ENCOUNTER — Other Ambulatory Visit: Payer: Self-pay | Admitting: Family Medicine

## 2016-04-26 VITALS — BP 135/76 | HR 93 | Temp 98.0°F | Resp 26 | Wt 183.4 lb

## 2016-04-26 DIAGNOSIS — Z01818 Encounter for other preprocedural examination: Secondary | ICD-10-CM | POA: Insufficient documentation

## 2016-04-26 DIAGNOSIS — I724 Aneurysm of artery of lower extremity: Principal | ICD-10-CM

## 2016-04-26 DIAGNOSIS — R9431 Abnormal electrocardiogram [ECG] [EKG]: Secondary | ICD-10-CM | POA: Insufficient documentation

## 2016-04-26 DIAGNOSIS — R0602 Shortness of breath: Secondary | ICD-10-CM | POA: Insufficient documentation

## 2016-04-26 DIAGNOSIS — Z01810 Encounter for preprocedural cardiovascular examination: Secondary | ICD-10-CM | POA: Insufficient documentation

## 2016-04-26 DIAGNOSIS — Z79899 Other long term (current) drug therapy: Secondary | ICD-10-CM | POA: Insufficient documentation

## 2016-04-26 DIAGNOSIS — J449 Chronic obstructive pulmonary disease, unspecified: Principal | ICD-10-CM

## 2016-04-26 DIAGNOSIS — I2129 ST elevation (STEMI) myocardial infarction involving other sites: Secondary | ICD-10-CM | POA: Insufficient documentation

## 2016-04-26 DIAGNOSIS — Z9981 Dependence on supplemental oxygen: Secondary | ICD-10-CM | POA: Insufficient documentation

## 2016-04-26 LAB — CBC NO DIFFERENTIAL
HEMATOCRIT: 44.3 % (ref 41.0–53.0)
HEMOGLOBIN: 14.8 g/dL (ref 13.5–17.5)
MCH: 31.1 pg (ref 27.0–33.0)
MCHC: 33.4 % (ref 32.0–36.0)
MCV: 93.3 UM3 (ref 80.0–100.0)
MPV: 8 UM3 (ref 6.8–10.0)
Platelet Count: 261 10*3/uL (ref 130–400)
RDW: 13.1 % (ref 0.0–14.7)
RED CELL COUNT: 4.75 10*6/uL (ref 4.50–5.90)
White Blood Cell Count: 10.3 10*3/uL (ref 4.5–11.0)

## 2016-04-26 LAB — COMPREHENSIVE METABOLIC PANEL
ALANINE TRANSFERASE (ALT): 24 U/L (ref 6–63)
ALBUMIN: 3.3 g/dL (ref 3.2–4.6)
ALKALINE PHOSPHATASE (ALP): 59 U/L (ref 35–115)
ASPARTATE TRANSAMINASE (AST): 22 U/L (ref 15–43)
BILIRUBIN TOTAL: 0.6 mg/dL (ref 0.3–1.3)
CALCIUM: 9.2 mg/dL (ref 8.6–10.5)
CARBON DIOXIDE TOTAL: 32 mmol/L (ref 24–32)
CHLORIDE: 98 mmol/L (ref 95–110)
CREATININE BLOOD: 0.94 mg/dL (ref 0.44–1.27)
Glucose: 100 mg/dL — ABNORMAL HIGH (ref 70–99)
POTASSIUM: 5.4 mmol/L — AB (ref 3.3–5.0)
PROTEIN: 6.6 g/dL (ref 6.3–8.3)
SODIUM: 138 mmol/L (ref 135–145)
UREA NITROGEN, BLOOD (BUN): 12 mg/dL (ref 8–22)

## 2016-04-26 MED ORDER — ALBUTEROL SULFATE HFA 90 MCG/ACTUATION AEROSOL INHALER
2.0000 | INHALATION_SPRAY | RESPIRATORY_TRACT | 1 refills | Status: DC | PRN
Start: 2016-04-26 — End: 2017-04-02

## 2016-04-26 MED ORDER — MOMETASONE-FORMOTEROL HFA 100 MCG-5 MCG/ACTUATION AEROSOL INHALER
1.0000 | INHALATION_SPRAY | Freq: Four times a day (QID) | RESPIRATORY_TRACT | 2 refills | Status: DC | PRN
Start: 2016-04-26 — End: 2017-04-02

## 2016-04-26 NOTE — Progress Notes (Signed)
VASCULAR SURGERY CLINIC NOTE    Patient: Kenneth Gordon Attending: Wynetta Fines, MD PI# [phone]   MRN: [id number] Date of Visit: 04/26/2016      IDENTIFYING DATA /  CHIEF COMPLAINT  Kenneth Gordon is a 14yr male presenting to clinic today for followup of a 6cm popliteal artery aneurysm. The patient recently obtained CTA for preoperative planning and returns for discussion of operative intervention    SUBJECTIVE  The patient states his breathing is a bit worse than usual and feels he needs home O2. He has messaged Dr. Derrek Gu, his primary care doctor, about this and is awaiting a response. Additionally. He is running out of his Zenhale inhaler and wound like a prescription for this. Otherwise, he states he is doing well. His ambulation is limited by his respiratory status. He denies any claudication, rest pain, or ischemic tissue loss/nonhealing wounds on his feet. He does have chronic plaques associated with psoriasis but these have not change recently.    Anti-platelet: ASA  Anti-coag: none  Statin Use: none    PHYSICAL EXAMINATION  Vitals:    04/26/16 1030   BP: 171/83   Pulse: 93   Resp: 26   Temp: 36.7 C (98 F)   TempSrc: Tympanic   Weight: 83.2 kg (183 lb 6.8 oz)   Of note, on intiial evaluation, the patient was saturating at 89% after walking from waiting room to where his vital signs were being taken. After being seated for some time,. His O2 sat returned to 94%.    General: NAD, pleasant, cooperative  Neck: No carotid bruit.  Cardiac: normal rate, regular rhythm, no murmurs  Resp: Diminished breath sounds in the bilateral bases, inspiratory wheezes in the left upper lung, slightly short of breath with even minor activity such as getting out of chair to exam table, also with chronic cough.  Abdomen: Soft, nontender, nondistended.  Extremites: Warm, 5/5 strength and intact sensation, no ulceration or lesion  Vascular Exam:   Carotid Rad Fem Pop PT DP   RIGHT Present (no bruit) Present Present (no bruit) Present  Present Present   LEFT Present (no bruit) Present Present (no bruit) Present Present Present        NONINVASIVE TEST  CTA bilateral lower extremity: Abdominal aorta patent with no aneurysmal degeneration. R common femoral artery, superficial femoral artery, deep femoral artery, popliteal, and tibial vessels patent without aneurysm or occlusion. R common femoral artery ectatic. L common femoral artery, superficial femoral artery, deep femoral artery patent without aneurysm or occlusion. 6x6cm aneurysm of the left popliteal artery which begins distal to the adductor hiatus and ends proximal to the trifurcation in the infragenicular popliteal artery. Significant thrombus burden with small residual lumen (2-21mm). superficial femoral artery proximal to aneurysm is 7.6-8 mm, popliteal distal to aneurysm is 6-6.7mm in size. Aneurysm length is 8.7cm (from healthy artery to healthy artery)    ASSESSMENT / PLAN  Kenneth Gordon is a 73yr male presenting for discussion of operative plan for left popliteal artery aneurysm. I had an extensive discussion with the patient and his son regarding options for management of aneurysm including no surgery (risk of thrombosis or embolism which can result in limb loss), open surgery (more invasive, requires general anesthesia with associated risk of MI, stroke, death, pneumonia, prolonged intubation, prolonged hospital stay, pain, poor wound healing, prolonged recovery process, but more definitive repair with better long term patency, particularly if vein is used as conduit, and benefit of aneurysmorrhaphy which can reduce  compressive symptoms), or endovascular surgery.    I discussed the plan to start with right groin access with pre-closure technique to allow up-and-over selection of the left superficial femoral artery/pop and placement of supportive sheath, placement of embolic protection device to reduce risk of embolization of thrombus, and use of covered stent for treatment of the  aneurysm. We discussed the risk, including bleeding, hematoma, pseudoaneurysm, infection, ischemia and limb loss, need for more procedures, contrast reaction and allergy. We also discussed the benefits of the procedure (shorter recovery time, less pain, lower risk for wound infection or poor wound healing). Patient agreeable to proceeding with endovascular intervention for his popliteal aneurysm. Written and verbal consent was provided.    Additionally, DR. Acton's office was called regarding possible need for home O2 and refill on Zenhale. Per MA Gretel Acrehing, will work on sending inhaler prescriptions to help optimize patient's respiratory status in anticipation of surgery on Wednesday.     Will send patient for preoperative labs today.    Patient is Mallampati 3, ASA 2    Completed by:  Luci BankMimmie Glennys Schorsch, MD, PI #: [id number] [id number]  Vascular Pager: [phone].

## 2016-04-26 NOTE — Telephone Encounter (Signed)
From: Kenneth Gordon  To: Kenneth Jacksonhomas Paul Acton, Kenneth Gordon  Sent: 04/26/2016 9:13 AM PST  Subject: MyChart Refill Request    Good Morning    [place] am running out of [place] inhal[place]. I don't know if this inhaler is over the counter or prescription med. Dr. Felipa FurnaceGarcia wrote the script in [place] and I was wondering if you could write me a prescription [facility] Thanks in Advance.    Kenneth Gordon

## 2016-04-26 NOTE — Telephone Encounter (Signed)
Pt need refill for this med.

## 2016-04-26 NOTE — Patient Instructions (Signed)
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    Go to the lab on today, on the first floor, [address].    Bring a list of your medications with you to the hospital.    1.  ARRIVAL INFORMATION:  [facility],  [address], [place] [id number]. Please report to the Admissions Office, room 1P210, on the first floor of [facility] of the hospital.  The Admissions Office is located next to the pharmacy, and behind the information desk in the lobby.       If any changes in your health occur, or for any questions concerning  admission, call Bettina GaviaShirley Coralie Stanke, RN at [phone] or call the clinic at [phone]    2.  FASTING GUIDELINES:  Do not eat any solid foods 8 hours prior to the time of arrival.  Clear liquids may be continued until 2 hours prior to the time of arrival.  Clear liquids include water,  clear fruit juice (no pulp), carbonated beverages, black coffee or tea without cream or milk.  No alcohol containing beverages, no jello and no broth, no gum chewing, candy, or smoking.    3.  MEDICATION GUIDELINES:  Continue your usual medications, including pain meds and blood pressure or heart medication, except as noted below.  Continue taking Aspirin and Plavix if this is prescribed.  Do not stop.  Bring your inhaler to the hospital with you.      4.  DISCHARGE INSTRUCTIONS:  Bring the insurance card on the day of the procedure.   You must have adequate transportation home and be accompanied by a responsible adult.  You are expected to stay in the hospital over night if intervention carried out (stent or angioplasty).  Take a bath or shower on the morning of the procedure and wear clean, loose clothing that zips or buttons in the front.  Please leave all cosmetics, jewelry and other valuables at home.    Pre and post standardized teaching on: angiography with possible intervention

## 2016-04-26 NOTE — Telephone Encounter (Signed)
Duplicate request.    Kenneth Figge, MA

## 2016-04-30 ENCOUNTER — Encounter: Payer: Self-pay | Admitting: GEN VASCULAR SURG

## 2016-04-30 LAB — POC ELECTROCARDIOGRAM WITH RHYTHM STRIP: QTC: 453

## 2016-04-30 NOTE — Telephone Encounter (Signed)
From: Renard Hamperobert G Blankenship  To: Desiree Haneavid Lee Dawson, MD  Sent: 04/29/2016 12:12 PM PST  Subject: Visit Follow-up Question    Dr.    I have had leg pains in both calf's and my left ankle last night. I had to get up and sort of walk off the pain. Similar to cramps but different. Then as I was typing this to you I had a session where my lungs seemed to cramp and was very painful. I took a shot from both my inhaler and it has subsided for the moment.     Is this anything to be worried about and a concern for my surgery on Wed.

## 2016-05-01 ENCOUNTER — Ambulatory Visit: Payer: Medicare Other | Admitting: Student in an Organized Health Care Education/Training Program

## 2016-05-01 ENCOUNTER — Ambulatory Visit: Payer: Medicare Other

## 2016-05-01 ENCOUNTER — Ambulatory Visit (HOSPITAL_BASED_OUTPATIENT_CLINIC_OR_DEPARTMENT_OTHER): Payer: Medicare Other

## 2016-05-01 ENCOUNTER — Ambulatory Visit (HOSPITAL_BASED_OUTPATIENT_CLINIC_OR_DEPARTMENT_OTHER): Payer: Medicare Other | Admitting: Student in an Organized Health Care Education/Training Program

## 2016-05-01 ENCOUNTER — Ambulatory Visit
Admission: RE | Admit: 2016-05-01 | Discharge: 2016-05-01 | DRG: 301 | Disposition: A | Discharge status: Home / Self Care | Payer: Medicare Other | Source: Ambulatory Visit | Attending: GEN VASCULAR SURG | Admitting: GEN VASCULAR SURG

## 2016-05-01 DIAGNOSIS — F17210 Nicotine dependence, cigarettes, uncomplicated: Secondary | ICD-10-CM | POA: Insufficient documentation

## 2016-05-01 DIAGNOSIS — I724 Aneurysm of artery of lower extremity: Principal | ICD-10-CM | POA: Insufficient documentation

## 2016-05-01 DIAGNOSIS — I10 Essential (primary) hypertension: Secondary | ICD-10-CM | POA: Insufficient documentation

## 2016-05-01 LAB — POTASSIUM: POTASSIUM: 4.1 mmol/L (ref 3.3–5.0)

## 2016-05-01 LAB — TYPE AND SCREEN
ANTIBODY SCREEN: NEGATIVE
PATIENT BLOOD TYPE: A POS

## 2016-05-01 MED ORDER — ASPIRIN 81 MG TABLET,DELAYED RELEASE
81.0000 mg | DELAYED_RELEASE_TABLET | Freq: Every day | ORAL | Status: DC
Start: 2016-05-01 — End: 2016-05-01
  Administered 2016-05-01: 81 mg via ORAL
  Filled 2016-05-01: qty 1

## 2016-05-01 MED ORDER — FENTANYL (PF) 50 MCG/ML INJECTION SOLUTION
25.0000 ug | INTRAMUSCULAR | Status: DC | PRN
Start: 2016-05-01 — End: 2016-05-01

## 2016-05-01 MED ORDER — ALBUTEROL SULFATE HFA 90 MCG/ACTUATION AEROSOL INHALER
2.0000 | INHALATION_SPRAY | RESPIRATORY_TRACT | Status: DC | PRN
Start: 2016-05-01 — End: 2016-05-01
  Filled 2016-05-01: qty 8

## 2016-05-01 MED ORDER — LACTATED RINGERS IV INFUSION
INTRAVENOUS | Status: DC | PRN
Start: 2016-05-01 — End: 2016-05-01
  Administered 2016-05-01: 09:00:00 via INTRAVENOUS

## 2016-05-01 MED ORDER — ONDANSETRON HCL (PF) 4 MG/2 ML INJECTION SOLUTION
INTRAMUSCULAR | Status: DC | PRN
Start: 2016-05-01 — End: 2016-05-01
  Administered 2016-05-01: 4 mg via INTRAVENOUS

## 2016-05-01 MED ORDER — BUDESONIDE-FORMOTEROL HFA 80 MCG-4.5 MCG/ACTUATION AEROSOL INHALER
2.0000 | INHALATION_SPRAY | Freq: Two times a day (BID) | RESPIRATORY_TRACT | Status: DC
Start: 2016-05-01 — End: 2016-05-01
  Filled 2016-05-01: qty 10.2

## 2016-05-01 MED ORDER — MOMETASONE-FORMOTEROL HFA 100 MCG-5 MCG/ACTUATION AEROSOL INHALER
1.0000 | INHALATION_SPRAY | Freq: Two times a day (BID) | RESPIRATORY_TRACT | Status: DC
Start: 2016-05-01 — End: 2016-05-01

## 2016-05-01 MED ORDER — CEFAZOLIN IV PUSH ONCALL TO OR
2.0000 g | INTRAMUSCULAR | Status: AC
Start: 2016-05-01 — End: 2016-05-01
  Administered 2016-05-01: 2000 mg via INTRAVENOUS

## 2016-05-01 MED ORDER — INTRAOP DEXMEDETOMIDINE 100 MCG/ML IV BOLUS DOSE
Status: DC | PRN
Start: 2016-05-01 — End: 2016-05-01
  Administered 2016-05-01 (×2): 20 ug via INTRAVENOUS
  Administered 2016-05-01: 40 ug via INTRAVENOUS
  Administered 2016-05-01: 20 ug via INTRAVENOUS

## 2016-05-01 MED ORDER — HEPARIN, PORCINE (PF) 1,000 UNIT/ML INJECTION SOLUTION
INTRAMUSCULAR | Status: DC | PRN
Start: 2016-05-01 — End: 2016-05-01
  Administered 2016-05-01: 6000 [IU] via INTRAVENOUS
  Administered 2016-05-01: 3000 [IU] via INTRAVENOUS

## 2016-05-01 MED ORDER — LISINOPRIL 20 MG TABLET
20.0000 mg | ORAL_TABLET | Freq: Every day | ORAL | Status: DC
Start: 2016-05-01 — End: 2016-05-01

## 2016-05-01 MED ORDER — ACETAMINOPHEN 1,000 MG/100 ML (10 MG/ML) INTRAVENOUS SOLUTION
INTRAVENOUS | Status: DC | PRN
Start: 2016-05-01 — End: 2016-05-01
  Administered 2016-05-01: 1000 mg via INTRAVENOUS

## 2016-05-01 MED ORDER — CLOPIDOGREL 75 MG TABLET
75.0000 mg | ORAL_TABLET | Freq: Every day | ORAL | Status: DC
Start: 2016-05-01 — End: 2016-05-01
  Administered 2016-05-01: 75 mg via ORAL
  Filled 2016-05-01: qty 1

## 2016-05-01 MED ORDER — CLOPIDOGREL 75 MG TABLET
75.0000 mg | ORAL_TABLET | Freq: Every day | ORAL | 5 refills | Status: DC
Start: 2016-05-01 — End: 2016-11-02

## 2016-05-01 MED ORDER — ONDANSETRON HCL (PF) 4 MG/2 ML INJECTION SOLUTION
4.0000 mg | INTRAMUSCULAR | Status: DC | PRN
Start: 2016-05-01 — End: 2016-05-01

## 2016-05-01 MED ORDER — CLOBETASOL 0.05 % TOPICAL OINTMENT
TOPICAL_OINTMENT | Freq: Two times a day (BID) | TOPICAL | Status: DC
Start: 2016-05-01 — End: 2016-05-01
  Filled 2016-05-01: qty 60

## 2016-05-01 MED ORDER — DEXMEDETOMIDINE 100 MCG/ML INTRAVENOUS SOLUTION
200.0000 ug | Status: DC | PRN
Start: 2016-05-01 — End: 2016-05-01
  Administered 2016-05-01: .01 ug/kg/min via INTRAVENOUS
  Administered 2016-05-01: 10:00:00 via INTRAVENOUS

## 2016-05-01 MED ORDER — REMIFENTANIL 1 MG INTRAVENOUS SOLUTION
2000.0000 ug | INTRAVENOUS | Status: DC | PRN
Start: 2016-05-01 — End: 2016-05-01
  Administered 2016-05-01: .02 ug/kg/min via INTRAVENOUS

## 2016-05-01 MED ORDER — LACTATED RINGERS IV INFUSION
INTRAVENOUS | Status: DC
Start: 2016-05-01 — End: 2016-05-01

## 2016-05-01 NOTE — Nurse Assessment (Signed)
Pre-cath admission    Pt admitted from home 05/01/2016, 07:39 for peripheral cath. Alert, oriented x 3. Oriented pt to unit routine. Keep NPO. IV placed and type and screen sent, admission assessment complete. Bilateral groins clipped per order.    Fonda KinderKer Talisa Petrak, RN RN

## 2016-05-01 NOTE — Nurse Transfer Note (Signed)
PACU Transfer Note, [facility] Transport  Note started on 05/01/2016 , 13:56    Patient transferred to D6 unit  via bed. Escorted by HA at 1355 hours. Assessment unchanged.  Belongings with not applicable. Report in EMR and Charge Nurse notified patient in route. Frankey PootPhoua Shirlean Berman, RN

## 2016-05-01 NOTE — Nurse Assessment (Signed)
PACU ADMIT NURSING NOTE    Note Started: 05/01/2016, 11:00     Received patient from OR for Cath lab at 1058 hours via bed.  Monitor and Alarms on.  Patient sleepy but arousable. Frankey PootPhoua Caralyn Twining, RN

## 2016-05-01 NOTE — Progress Notes (Signed)
The patient was seen and evaluated with the vascular surgery resident, Dr. Rozelle Logan, who has documented the history, examination findings, and our plan. I agree with the resident's documentation, as noted in the EMR records.    Kenneth Gordon has a left he has a sensation of fullness but no pain associated with the aneurysm.  There is no swelling of the leg to suggest venous compression nor does he have neuritic symptoms.    The aneurysm should be repaired.  We discussed the option of open surgical repair.  Because it appears to be relatively short in length, direct surgical repair from a posterior approach would allow resection of the aneurysm and repair with interposition graft. A segment of autologous vein or prosthetic graft could be used.    Patient is an Engineer, building services.  He lives in [place].  His priority is to return to his [place] as soon as practical, without a long convalescence. I think that this priority would be best met with endovascular therapy.  Imaging suggests that his aneurysm is amenable to a to treatment with a covered stent.  He indicated he preferred this approach.    I discussed the plan for arteriography and the of endovascular therapy for treatment of his left PAA. The risks of arterial access, including vessel or nerve injury, bleeding, infection, limb ischemia, embolization, reaction to medication, or need for operation, were discussed. The risks of contrast administration, including renal failure or allergic type reaction, were discussed. We also discussed the additional risks of intervention, including vessel injury, infection, residual flow within the aneurysm, need for operation or additional intervention, failure to resolve symptoms, limb loss, or other serious complications. The patient indicated understanding of the procedure, plan, alternatives, and risks, and voiced consent to proceed.    I counseled the patient about the plan of care and addressed education needs. There were no  barriers to learning and the patient indicated understanding of the plan of care.    Report electronically signed by   Kenneth Chars. Renato Battles, MD, FACS  Professor, Vascular and Endovascular Surgery  PI# [phone]  Pager [phone]

## 2016-05-01 NOTE — Discharge Instructions (Signed)
Procedure Performed: Angiography of the aorta and left lower extremity with stenting and angioplasty  Attending Surgeon: Sherle PoeWilliam Pevec, MD  Date: 05/01/2016        Incision Care:  You may remove the bandages from your groin tomorrow. You may shower starting tomorrow.      You may have noticed some swelling at your surgical sites. The area may bruise over the next several days. This is normal for your operation.        Wound Care:  Please shower and wash your feet daily.        Activity:  No strenuous exercise for 1 week.  You may otherwise walk and exercise without restriction.      Your legs may be swollen after the operation.  This is normal.  Keep your legs elevated as much as possible, in between activities.    You may not drive while taking narcotic pain medication.      Medications:  Resume your home medications as directed.   *Take Aspirin and Plavix daily.     For pain, take tylenol and/or ibuprofen as needed. Narcotic pain medications can only be provided as hand written prescriptions so contact the clinic before you run out.     Narcotics can cause constipation so if you are taking narcotic pain medications regularly you should also be taking a stool softener.      Precautions:  If you have any of the following problems, please call Shelby MattocksJanet Wells, Vascular Surgery RN,at [phone], during business hours, or the hospital operator at [phone] after hours and ask for the vascular surgeon on call:     * Temp greater than 101.5 degrees.   * Redness, swelling and drainage from incision.   * Worsening pain, not relieved by pain medication.   * Persistent vomiting and inablility to tolerate fluids.      For Chest pain, Fainting, Dizziness, Seizures, Trouble Breathing or any other Medical Emergencies call 911       Follow-up Appointments:  You will be seen in the vascular surgery clinic in approximately 2-4 weeks with an arterial duplex ultrasound. Please follow up in clinic PRIOR to leaving for [place].  If you do not hear from us within 1 week, please call Shelby MattocksJanet Wells, RN at [phone].       The Vascular Clinic is located in the Ambulatory Care Center at:  [address]  [place] [address]  [phone]  Vascular clinic - [address]

## 2016-05-01 NOTE — Nurse Focus (Signed)
Pt s/p vas cath. Pt off bedrest @ 1700 time. Pt off acuity to micropaq. Assisted pt to ambulate per protocol. Groin check post ambulation: stable, no hematoma, bruit or bleeding. Pedal pulses palpable. Denies pain in puncture site. Pending discharge.     Fonda KinderKer Shamaya Kauer, RN RN

## 2016-05-01 NOTE — Nurse Transfer Note (Signed)
Cardiac Catheterization Lab  Nursing Hand-Off Report    Interventional Cardiologist:  Arita Missawson    Fellow(s):  Louretta ParmaKwong     Procedure/Intervention Performed:   Left Popiteal stent   Findings: See MD note in EMR; Stented x 2    Pertinent History: See EMR    Total Medications:  See Anesthesia EMR    Intake:  Contrast Total: 110mL Visipaque  IV Fluids: See Anesthesia EMR mLNS     Output  Urine Output: See Anesthesia EMR mL  Other Output: See Anesthesia EMR mL  EBL: See Anesthesia EMR mL    Post-Procedure Pulses  RDP: Dop  LDP: Dop  RPT: Dop  LPT: Dop    Sheaths :  2612fr Right Femerol Art.-Perclosed @1041am     Last ACT: 216 seconds at 1019am    Vital Signs  See Anesthesia EMR    Procedural Complications: None    Report given to PACU. Patient transferred to PACU with RN, EKG, NBP, and SpO2 monitors.    NOTE:  MEDICATIONS (EITHER PO, TRANSDERMAL OR IV) MAY HAVE BEEN ORDERED TO BE DISCONTINUED IN THE CATH LAB. PLEASE CONSULT EMR: FOLLOW UP MAY BE REQUIRED TO RE-ESTABLISH MEDICATION     Haywood PaoKenzie Jordon Kristiansen, RN  Cardiac Catheterization Lab [phone]

## 2016-05-01 NOTE — Anesthesia Preprocedure Evaluation (Signed)
Anesthesia Evaluation    History of Present Illness  Kenneth Gordon is a 72Y M and longtime tobacco smoker with recently diagnosed HTN, severe COPD (not currently on home O2), and 6 cm LEFT popliteal artery aneurysm presenting for elective angiography and stenting/repair.    The patient denied having any prior surgical or anesthetic history. He denied having any known family history of anesthetic complications.    Per review of the notes, he experienced DOE walking 100 ft previously. Today, he was seen breathing comfortably with the [facility] elevated without any oxygen requirements, speaking in full complete sentences. He states that he feels better.    The risks and benefits of MAC (including oversedation) and general anesthesia were discussed with the patient, to which he expressed understanding.     Airway   Mallampati: II  TM distance: >3 FB     Neck ROM is full.     Dental    (+) edentulous   Pulmonary   (+) COPD (severe), shortness of breath, decreased breath sounds, wheezes  (-) pneumonia, recent URI, sleep apnea    Pulmonary PE comment: Expiratory wheezes with prolonged expiratory phase R>L Cardiovascular   (+) hypertension (),  DOE,   (-) pacemaker, valvular problems/murmurs, past MI, CAD, CABG/stent, hypertriglyceridemia, dysrhythmias, CHF, orthopnea    Rhythm: regular  Rate: normal  Patient has poor exercise tolerance.   Neuro/Psych - negative for neuro conditions with ROS   (+) no chronic back pain  (-) no seizures, CVA  Patient has no psychiatric history.   GI/Hepatic/Renal - negative for GI hepatic or renal condions with ROS  (-) obese  (-) chronic renal disease, GERD and liver disease     Endo/Other - negative for endocrine conditions with ROS    (-) diabetes mellitus,           I have reviewed the patient's [facility] unit notes and Nursing notes.    Temp: 36.3 C (97.3 F) (02/07 0637)  Temp src: Oral (02/07 0637)  Pulse: 84 (02/07 0637)  BP: 118/57 (02/07 0637)  Resp: 18 (02/07 0637)  SpO2: 100 % (02/07  0637)  Height: 188 cm (_0 ) (02/07 0630)  Weight: 79.7 kg (175 lb 11.3 oz) (02/07 8841)         Anesthesia Plan    ASA 3     MAC     intravenous induction   Anesthetic plan and risks discussed with patient.  Resident/Fellow/CRNA discussed the plan with the attending.  I personally performed a physical assessment on this patient.    ---  Fletcher Anon, MD  Department of Anesthesiology and Pain Medicine, YS-0/YTK-1  Licensed; PI#: [id number]  Pager [phone]

## 2016-05-01 NOTE — Anesthesia Postprocedure Evaluation (Addendum)
Patient: Kenneth Gordon    Cath Lab    Anesthesia Type: MAC    Vital Signs (Last Recorded):  BP: 96/62 (05/01/16 1300)  Pulse: 61 (05/01/16 1325)  Resp: 16 (05/01/16 1325)  Temp Max: 36.3 C (97.4 F)  (Last 24 hours)  Temp: 36.3 C (97.3 F) (pt refused warm blanket) (05/01/16 1325)  SpO2: 100 % (05/01/16 1325) on    O2 Device: nasal cannula (05/01/16 1300)      Anesthesia Post Evaluation    Procedure: * No procedures listed *  Location: [facility] OR (DON'T SCHEDULE)  Anesthesia: Monitored Anesthesia Care    Patient location during evaluation: PACU  Patient participation: complete - patient participated  Level of consciousness: awake and alert  Pain score: 0  Pain management: adequate  Airway patency: patent  Anesthetic complications: no  Cardiovascular status: blood pressure returned to baseline  Respiratory status: nasal cannula  Hydration status: euvolemic  Nausea and Vomiting: absent  Comments: 05/01/2016 15:44    This patient was seen, evaluated, and care plan was developed with the resident/CRNA.  I agree with the assessment and plan as outlined in the resident/CRNA's note.        Electronialclly Singned by:  Theodis Shove, MD  Professor of Clinical Anesthesiology  Attending Anesthesiologist  PI# [phone]  Pager [phone]     Doroteo Glassman, MD    [phone] s/p popliteal aneurysm stenting. Initially hypotensive upon arrival to PACU, however continued to demonstrate appropriate mentation. BP responded to fluid bolus. Otherwise VSS. Pain well-controlled. Denies N/V. Tolerating PO. Patient appropriate for discharge to telemetry from PACU.    Doroteo Glassman, MD

## 2016-05-01 NOTE — Brief Op Note (Addendum)
VASCULAR SURGERY BRIEF ENDOVASCULAR PROCEDURE NOTE    Pre-Procedure Diagnosis: left popliteal artery aneurysm    Post-Procedure Diagnosis: left popliteal artery aneurysm    Procedure Performed/Description:  1. Aortogram with bilateral iliofemoral runoff  2. Left lower extremity angiogram  3. Selective catheterization, 4th order, left posterior tibial artery   4. Use of distal embolic protection device  5. Use of intravascular ultrasound for evaluation of popliteal artery  6. Placement of self-expanding covered stents (Viabahn 7x12100mm and 9x2050mm) in the popliteal artery.  7. Post-stenting angioplasty of the popliteal artery using 6x60 [place] and 8x20 Charger balloon catheters.  8. Use of pre-close closure technique (Proglide x2)  9. Conscious sedation under continuous hemodynamic monitoring    Attending: Wynetta Finesavid Kateleen Encarnacion, MD    Assistant(s): 1. Luci BankMimmie Kwong, MD    Implants: Viabahn 7x111000mm and 9x6450mm stent grafts    Findings:  - Aorta and bilateral common, internal, and external iliac arteries patent without focal stenosis.  - Right common femoral artery ectatic, right deep and superficial femoral arteries patent without focal stenosis.  - Left common femoral, deep femoral, and superficial femoral arteries patent without focal stenosis  - Large left popliteal artery aneurysm, successfully excluded using Viabahn 7x12400mm and 9x7350mm stent grafts  - Left anterior tibial artery diffusely diseased but patent with flow to the level of the ankle. Left tibioperoneal trunk, posterior tibial, and peroneal arteries patent without focal stenosis. Posterior tibial artery provides flow into the ankle.    Complications: None    Fluoroscopy Time (min): 17.1    Contrast used (ml): 110    Dose Area Product (Gycm2): 50.2    Plan: Bedrest for 6 hours, then ambulate. Restart home medications. Dual antiplatelet. Possible DC home tonight or tomorrow.    Report Electronically Signed by: Luci BankMimmie Kwong, MD PID: [id number]  Resident  Pager: [phone]  Service  Pager: [phone]    I was physically present during the entire procedure(s) indicated herein.  Report electronically signed by Desiree Haneavid Lee Masami Plata, MD.

## 2016-05-01 NOTE — Procedures (Signed)
Kenneth Gordon:  Gordon, Kenneth  MR #:  [id number]  DOB:  03/13/1946  SEX:  M  AGE:  70  OPERATION DATE:  05/01/2016  INPATIENT OPERATION RECORD    PREOPERATIVE DIAGNOSIS:    Left popliteal artery aneurysm.    POSTOPERATIVE DIAGNOSIS:    Left popliteal artery aneurysm.    PROCEDURES PERFORMED:    1.  Aortogram with bilateral iliofemoral runoff.  2.  Left lower extremity angiogram.  3.  Selective catheterization, fourth order, left posterior tibial  artery.  4.  Use of distal embolic protection device.  5.  Use of intravascular ultrasound for evaluation of popliteal  artery.  6.  Placement of self-expanding stents (Viabahn 7 x 100 mm and 9 x 50  mm) in the popliteal artery.  7.  Post-stenting angioplasty of the popliteal artery using 6 x 60  Armada and 8 x 20 Charger balloon catheters.  8.  Use of Preclose closure technique (ProGlide x2).  9.  Conscious sedation under continuous hemodynamic monitoring.    INDICATIONS FOR PROCEDURE:    The patient is a 71 year old man who was noted to have an  asymptomatic, 6 cm popliteal artery aneurysm.  On evaluation of his  imaging, it was felt that the patient could benefit from open or  endovascular technique for repair of his popliteal artery aneurysm.  After further discussion with the patient regarding the risks and  benefits of both techniques, it was felt that endovascular technique  would be a better option for this patient, who desired a faster  recovery time after his procedure and for whom the aneurysm was not  causing local compressive symptoms.  The indications, risks, benefits,  and alternatives to the procedure were discussed with the patient, and  he provided written and verbal consent to proceed.    DETAILS OF PROCEDURE:    The patient was brought to the Operating Room and placed in the supine  position.  Appropriate surgical pause was taken to confirm the  patient, using two-patient identifiers, the site of procedure, and the  procedure to be performed.  The patient then  underwent monitored  sedation with continuous hemodynamic monitoring.  The patient's  bilateral groins were then prepped and draped in the usual sterile  fashion.    Using ultrasound, the right common femoral artery was identified, as  well as the right femoral bifurcation.  This was noted to be largely  free of disease.  1% lidocaine was infiltrated subcutaneously to  achieve local anesthesia over the anticipated site of our access.  Next, access was obtained using a micropuncture needle under direct  ultrasound visualization.  After a needle access was obtained into the  right common femoral artery, a Seldinger technique was utilized to  serially dilate our access up to a 5-French 10 cm sheath.  A 0.035  Bentson wire was advanced through the sheath into the abdominal aorta.  We then performed our preclosure over the Bentson wire using two  ProGlides placed in the 2 and 8 o'clock positions, and 4 and 10  o'clock positions.    After this, an 8-French 10 cm sheath was advanced over the wire into  our right common femoral artery access.  The sheath dilator was  removed, and a ContraFlush catheter was advanced over the wire into  the abdominal aorta.  Through the catheter, we completed an aortogram  with bilateral iliofemoral runoff, which revealed a patent aorta and  bilateral common iliac, internal iliac, and external iliac arteries,  without any focal stenosis.  Bilateral femoral runoff was also  completed, which showed a patent right common femoral artery that was  ectatic, with patent right superficial femoral and deep femoral  arteries that did not have any focal stenoses.  The left common  femoral artery, superficial femoral artery, and deep femoral arteries  were patent without focal stenosis.  At this point, the patient was  dosed with 6000 units of heparin intravenously and ACT was  periodically re-checked throughout the case and heparin re-dosed, as  indicated, to achieve therapeutic  anticoagulation.    Then, under fluoroscopic visualization, we utilized a 0.035 Glidewire  advanced through the ContraFlush catheter to obtain wire access into  the left superficial femoral artery.  The ContraFlush catheter was  then advanced over the wire into the left superficial femoral artery.  Through this, we then obtained a left lower extremity subtraction  angiogram.  This revealed patent flow through the popliteal artery,  with a large popliteal artery aneurysm.  The anterior tibial artery  appeared diffusely diseased but without occlusion, with flow into the  level of the ankle.  The peroneal artery appeared patent without focal  stenosis, with flow to the level of the ankle.  The posterior tibial  artery appeared patent without focal stenosis, with flow into the  ankle.    At this point, a 0.035 Amplatz wire was advanced through the catheter  into the left superficial femoral artery.  We then removed the  catheter and the 8-French sheath and advanced a 12-French 45 cm sheath  over the wire into the proximal superficial femoral artery.  The  sheath dilator was removed, and then we advanced an 8-French 70 cm  sheath over the wire into the distal superficial femoral artery.    The wire and sheath dilator were then removed.  We completed another  subtraction angiogram to provide better detail of the popliteal artery  aneurysm in anticipation of our repair.  Using this image for  guidance, we advanced a 0.014 BareWire Workhorse wire into the  patient's left posterior tibial artery.  The NAV6 Emboshield embolic  protection device was then advanced over the wire into the distal  popliteal artery, where it was deployed to provide distal embolic  protection. Next, over this wire, we completed an intravascular  ultrasound to assess the length of artery involved in the aneurysm, as  well as the diameters of the arteries proximal and distal to the  aneurysm to confirm the size of stent graft needed.    After this was  completed, we then advanced a 7 x 100 mm Viabahn stent  graft over the wire to the distal popliteal artery.  This was deployed  under direct fluoroscopic visualization.  After this was deployed, we  then post-dilated the stent graft using a 6 x 60 Armada balloon  catheter.  After this was completed, we then re-captured the embolic  protection device and removed this, along with the BareWire Workhorse  wire under direct fluoroscopic visualization.  The embolic protection  device was inspected and noted to be free of embolus.    We then advanced a 0.035 Rosen wire through the sheath into the distal  popliteal artery beyond the end-point of the stent.  The 8-French  sheath was removed.  We then advanced a 9 x 50 mm Viabahn self-  expanding covered stent over the wire to the proximal popliteal  artery, and this was deployed.  After the stent graft was placed, we  then advanced an 8 x 20 Charger balloon catheter over the wire to post-  dilate the stent graft.    As this was completed, the balloon catheter was removed, and we  completed an angiogram to the sheath.  This revealed patent flow  through the stent grafts, with good apposition, and no evidence of  endoleak.  The flow continued through a patent distal popliteal  artery, although there appeared to be areas of vasospasm within the  tibial vessels, likely due to our wire and catheter manipulations.  300 mcg of nitroglycerin was administered through the sheath.  We then  completed a final completion angiogram through the sheath, which again  revealed patent flow through the stent graft, with complete exclusion  of the popliteal artery aneurysm.  The distal popliteal artery, tibial  peroneal trunk, peroneal artery, posterior tibial artery, and anterior  tibial artery on the left appeared patent without occlusion.    At this point, the sheath was retracted into the distal abdominal  aorta.  The 0.035 Bentson wire was then readvanced through the sheath  into the  abdominal aorta.  The sheath was removed, and the ProGlide  sutures were then tightened.  After the arteriotomy was closed with  this technique, gentle manual pressure was held over our access site  for an additional 2 minutes to achieve hemostasis.    The patient tolerated the procedure well and was transferred to PACU  in stable condition.    At the completion of the procedure, all needle and sponge counts were  correct.    Dr. [name] was present for the entirety of this procedure.    FINDINGS:    1.  Aorta and bilateral common, internal, and external iliac arteries  patent without focal stenosis.  2.  Right common femoral artery ectatic, right deep femoral and  superficial femoral arteries patent without focal stenosis.  3.  Left common femoral, deep femoral, and superficial femoral  arteries patent without focal stenosis.  4.  Large left popliteal artery aneurysm, successful excluded using  Viabahn 7 x 100 mm and 7 x 50 mm stent grafts.  5.  Left anterior tibial artery diffusely diseased but patent flow to  the level of the ankle.  Left tibioperoneal trunk, posterior tibial,  and peroneal artery are patent without focal stenosis.  Posterior  tibial artery provided flow into the ankle.    COMPLICATIONS:    None apparent at the end of this procedure.    ESTIMATED BLOOD LOSS:    40 ml.    IV FLUIDS:    250 mL of crystalloid.    URINE OUTPUT:    Not recorded.    IMPLANTS:    7 x 100 mm and 9 x 50 mm Viabahn stent grafts.    SPECIMENS:    None.    DRAINS:    None.    CONTRAST USED:    110 mL of Visipaque.    DOSE AREA PRODUCT:    50.2 Gy cm2.    FLUOROSCOPY TIME:    17.1 minutes.    DISPOSITION:    To Recovery, patient will be on bedrest for 6 hours, then can  ambulate.  We will restart patient's home medications, as well as  start dual-antiplatelet therapy.  Possible discharge to home tonight  or tomorrow.    NAME OF SURGEONS AND ASSISTANTS:    Surgeon:  Desiree Hane, MD  Assistant Surgeon:  Luci Bank,  MD      Report  Electronically Signed - 05/02/2016 07:52:23 by  Luci Bank, MD  Resident  Surgery    Report Electronically Signed - 05/06/2016 18:18:13 by  Desiree Hane, MD  Professor    Surgery    Vascular Surgery    MK/co  D:  05/01/2016 12:00:53 PDT/PST  T:  05/01/2016 13:30:23 PDT/PST  Job #:  [id number] / [id number]

## 2016-05-01 NOTE — Nurse Assessment (Signed)
ASSESSMENT NOTE    Note Started: 05/01/2016, 18:41     Initial assessment completed and recorded in EMR.  Report received from day shift nurse and orders reviewed. Plan of Care reviewed and appropriate, discussed with patient. R groin soft and flat. Pt voided. Stated he is ready to be discharged. Will wait for MD orders and cont to monitor.  Cecille Rubinavid Thurl Boen, RN RN

## 2016-05-01 NOTE — Plan of Care (Signed)
Problem: Perioperative Period (Adult)  Goal: Signs and Symptoms of Listed Potential Problems Will be Absent or Manageable (Perioperative Period)  Signs and symptoms of listed potential problems will be absent or manageable by discharge/transition of care (reference Perioperative Period (Adult) CPG).   Outcome: Ongoing (interventions implemented as appropriate)  While in recovery, pt denied pain, denied N/V. Vitals stable. Groin dressing CDI. CMS intact.

## 2016-05-01 NOTE — Nurse Discharge Note (Signed)
Pt discharged to home with son. AHS given and explained. R groin soft and flat.

## 2016-05-01 NOTE — Nurse Assessment (Signed)
Dr. Brita RompJ. Lee anesthesia resident on case called to bedside regarding hypotension. Pt asymptomatic; resting comfortably. LR bag from OR open by Dr Nedra HaiLee

## 2016-05-01 NOTE — Discharge Summary (Addendum)
DEPARTMENT OF VASCULAR SURGERY  HOSPITAL DISCHARGE SUMMARY    Note Started: 05/01/2016, 20:07  Admission Date: 05/01/2016  6:23 AM  Discharge Date: 05/01/2016   Admission Diagnosis: Popliteal artery aneurysm [I72.4]  Discharge Diagnosis: same    PAST MEDICAL HISTORY:  Past Medical History:   Diagnosis Date    Allergic rhinitis     Essential hypertension     NO SIGNIFICANT HISTORY (aka NONE)     Psoriasis     Tobacco dependence        OPERATIONS / PROCEDURES:  05/01/16 [name]([name]):  1.  Aortogram with bilateral iliofemoral runoff.  2.  Left lower extremity angiogram.  3.  Selective catheterization, fourth order, left posterior tibial  artery.  4.  Use of distal embolic protection device.  5.  Use of intravascular ultrasound for evaluation of popliteal  artery.  6.  Placement of self-expanding stents (Viabahn 7 x 100 mL and 9 x 50  mL) in the popliteal artery.  7.  Post-stenting angioplasty of the popliteal artery using 6 x 60  Armada and 8 x 20 Charger balloon catheters.  8.  Use of Preclose closure technique (ProGlide x2).  9.  Conscious sedation under continuous hemodynamic monitoring.    CONSULTATIONS:  None    BRIEF HISTORY OF PRESENT ILLNESS:  Kenneth Gordon is a [age]yr male who was referred to vascular clinic for left popliteal artery aneurysm. He has had a chronic left popliteal fossae mass for several years and recently requested evaluation of this mass. 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    HOSPITAL COURSE/PROBLEM LIST AND MANAGEMENT:   Patient was taken to the cath lab.   He tolerated the procedure well without complications. Post operatively the patient was transferred to the floor. Sheath from procedure was pulled post-operatively which he tolerated without hematoma. His pain was initially controlled with IV pain medications. This was transitioned to oral pain medications which he was tolerating at the time of discharge. The patient was passing urine  and flatus independently at the time of discharge.     He received discharge instructions to watch for signs and symptoms including fever, chills, nausea, vomiting, abdominal pain, distension, or obstipation, increasing pain, erythema or discharge from the incision site(s). He was advised to shower but not to submerge the incisions in a bath or pool for at least 48 hours. He was also advised to not drive while on narcotics.    The patient was medically stable for discharge.     He participated in the decision making process and agreed with the plan of care. Questions were sought and answered. He received a copy of all discharge instructions and education material.    LABS: Reviewed    RADIOLOGY:  None    INFECTIONS / COMPLICATIONS:  none    DISPOSITION:  Patient is stable for discharge to: Home  Patient will follow up with arterial duplex in 2-4 weeks.    MEDICATIONS PRESCRIBED for DISCHARGE:   Current Discharge Medication List      START taking these medications    Details   Clopidogrel (PLAVIX) 75 mg Tablet Take 1 tablet by mouth every morning.  Qty: 30 tablet, Refills: 5         CONTINUE these medications which have NOT CHANGED    Details   Albuterol (PROAIR HFA, PROVENTIL HFA, VENTOLIN HFA) 90 mcg/actuation inhaler Take 2 puffs by inhalation every 4 hours if needed for  wheezing.  Qty: 17 g, Refills: 1    Associated Diagnoses: Chronic obstructive pulmonary disease, unspecified COPD type      ASPIRIN PO Take 100 mg by mouth every day.      Clobetasol (TEMOVATE) 0.05 % Ointment Use sparingly twice a day to affected area.  Qty: 60 g, Refills: 2    Associated Diagnoses: Psoriasis      Lisinopril (PRINIVIL, ZESTRIL) 20 mg Tablet Take 1 tablet by mouth every day. (blood pressure)  Qty: 90 tablet, Refills: 1      Mometasone-Formoterol 100-5 mcg/actuation HFA Aerosol Inhaler Take 1 puff by inhalation every 6 hours if needed.  Qty: 1 inhaler, Refills: 2    Associated Diagnoses: Chronic obstructive pulmonary disease,  unspecified COPD type           Anti-platelet: ASA and Plavix   Anti-coag: None  Statin Use: None    Wound care:  - Wash incision with soap and water    - Keep incision dry  - Do not submerge the wound (e.g. Swimming) for two weeks    Recommended Follow Up Appointments:  Vascular Clinic  [address]  [place] [address]  [phone]  In 2 weeks  Regarding your recent hospitalization     Vascular Surgery Appointment:  Follow up in vascular clinic in 2-4 weeks, prior to returning to [place]. You will be contacted by our office within 24 to 48 hours to schedule follow-up appointment.  If not contacted by our office, please call [name], Vascular RN @ [phone].           Scheduled Appointments:  No future appointments.     Electronically Signed on 05/01/2016 at 20:13 by:  Jenell Milliner, MD      Vascular Surgery Staff    I agree with the summary and plan as outlined in the resident's note.    Report electronically signed by   Kinnie Scales. [name], MD, FACS  Professor, Vascular and Endovascular Surgery  PI# [id number]  Pager [phone]

## 2016-05-01 NOTE — H&P (Signed)
VASCULAR SURGERY PRE-PROCEDURE NOTE    Planned Procedure: left lower extremity angiogram, angioplasty and stent placement for left popliteal artery aneurysm  Attending: Wynetta Finesavid Dawson, MD    Indication: left popliteal artery aneurysm    Site: Lower Extremity  Laterality: Left    I have reviewed the most recent H&P on 04/22/16 and: There have been no changes    Labs:  SODIUM (mEq/L)   Date Value   12/30/2014 139     Sodium (mmol/L)   Date Value   04/26/2016 138     POTASSIUM (mEq/L)   Date Value   12/30/2014 4.3     Potassium (mmol/L)   Date Value   04/26/2016 5.4     CHLORIDE (mEq/L)   Date Value   12/30/2014 100     Chloride (mmol/L)   Date Value   04/26/2016 98     CARBON DIOXIDE TOTAL (mEq/L)   Date Value   12/30/2014 30     Carbon Dioxide Total (mmol/L)   Date Value   04/26/2016 32     UREA NITROGEN, BLOOD (BUN) (mg/dL)   Date Value   08/65/784610/09/2014 17     Urea Nitrogen, Blood (BUN) (mg/dL)   Date Value   96/29/528402/04/2016 12     CREATININE BLOOD (mg/dL)   Date Value   13/24/401010/09/2014 0.97     Creatinine Serum (mg/dL)   Date Value   27/25/366402/04/2016 0.94     CALCIUM (mg/dL)   Date Value   40/34/742510/09/2014 8.8     Calcium (mg/dL)   Date Value   95/63/875602/04/2016 9.2     WHITE BLOOD CELL COUNT (K/MM3)   Date Value   12/30/2014 8.2     White Blood Cell Count (K/MM3)   Date Value   04/26/2016 10.3     HEMOGLOBIN (g/dL)   Date Value   43/32/951810/09/2014 16.5     Hemoglobin (g/dL)   Date Value   84/16/606302/04/2016 14.8     No results found for: INR  Type and screen/Type and Cross? 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

## 2016-05-01 NOTE — Nurse Assessment (Signed)
Pt with periods of coughing fits attempting to sit up. Pt reminded to lay flat and not bend right leg. Pt provided oral yankaur for suctioning as needed.

## 2016-05-01 NOTE — Nurse Focus (Signed)
TRANSFER NOTE - RECEIVING    Note Started: 05/01/2016, 14:21     Report received from PACU. Patient received at 1410 hours from PACU unit by bed. Pt condition stable . patient and family oriented to room and unit. Right groin stable.  Plan of care reviewed and updated. Fonda KinderKer Oshay Stranahan, RN RN

## 2016-05-02 ENCOUNTER — Telehealth: Payer: Self-pay | Admitting: GEN VASCULAR SURG

## 2016-05-02 DIAGNOSIS — I724 Aneurysm of artery of lower extremity: Secondary | ICD-10-CM

## 2016-05-02 MED FILL — iodixanoL 320 mg iodine/mL intravenous solution: INTRAVENOUS | Qty: 110 | Status: AC

## 2016-05-02 NOTE — Telephone Encounter (Addendum)
Reason for call: Hospital discharge    Diagnosis and intervention: Left popliteal aneurysm stent placement and post stent angioplasty 05/01/2016 by Dr Arita Missawson.    Summary of call: Mr Kenneth Gordon reports that he is doing well. He has mild left calf aching and swelling that is improving with ambulation and elevation. He has not taken any pain meds and feels that the symptoms are improving and the day progresses. He has not yet removed the dressing from the groin access site and plans to do that this evening when he takes a shower. He denies any swelling or drainage in the area and was advised to carefully dry the area after his shower and only recover it if there is drainage. His son is collecting the plavix prescription at the time of my call and Mr Kenneth Gordon will start taking the medication today while continuing aspirin. He has resumed his usual patterns of eating and sleeping.    Follow up plan: Dr Arita Missawson has requested follow up in 2-4 weeks with a duplex. Mr Kenneth Gordon is planning a trip to [place] and would like his follow up in two weeks so he can be checked before he leaves.     Amount of time spent educating patient: 20 minutes    Education     Title: Generic Teaching Goals/Outcomes (Active)     Points For This Title     Point: Medications (Done)    Description: Reason for the medication, significant clinical side effects, answer any questions prior to first administration.  Enter name of the new medication in the comments section.    Learning Progress Summary    Learner Readiness Method Response Comment Documented by Status   Patient Acceptance E VU DAPT JW 05/02/16 1256 Done                      User Key     Initials Effective Dates Name Provider Type Discipline    JW 11/24/14 -  Shelby MattocksJanet Nurah Petrides, RN .NURSE: (RN or LVN) Interdisciplinary

## 2016-05-02 NOTE — Telephone Encounter (Signed)
Encounter opened in error

## 2016-05-06 ENCOUNTER — Encounter: Payer: Self-pay | Admitting: Family Medicine

## 2016-05-06 ENCOUNTER — Other Ambulatory Visit: Payer: Self-pay | Admitting: Family Medicine

## 2016-05-06 DIAGNOSIS — F17200 Nicotine dependence, unspecified, uncomplicated: Principal | ICD-10-CM

## 2016-05-06 HISTORY — PX: IR AORTAGRAM: RADIR00917

## 2016-05-06 HISTORY — PX: IR ABDOMINAL AORTAGRAM: RADIR00917

## 2016-05-06 MED ORDER — BUPROPION HCL 150 MG TABLET,12 HR SUSTAINED-RELEASE(SMOKING DETERRENT)
150.0000 mg | EXTENDED_RELEASE_CAPSULE | Freq: Two times a day (BID) | ORAL | 2 refills | Status: DC
Start: 2016-05-06 — End: 2016-11-26

## 2016-05-06 NOTE — Telephone Encounter (Signed)
From: Renard Hamperobert G Boesch  Sent: 05/06/2016 10:31 AM PST  Subject: Non-urgent Medical Advice Question    Hi    How do I get prescription for Wellbutrin for my stop smoking program. I have already talked to the people at the stop smoking program and they have given me the recommended dosage for Nicotine patches and they said that by using this drug I will have better success quoting if I use this in conjunction with Welibutrin or equal drug.    Thanks in Advance.    Joanna Puffobert Maclellan

## 2016-05-12 ENCOUNTER — Encounter: Payer: Self-pay | Admitting: Family Medicine

## 2016-05-12 DIAGNOSIS — J449 Chronic obstructive pulmonary disease, unspecified: Secondary | ICD-10-CM

## 2016-05-12 DIAGNOSIS — L409 Psoriasis, unspecified: Principal | ICD-10-CM

## 2016-05-14 ENCOUNTER — Encounter: Payer: Self-pay | Admitting: Family Medicine

## 2016-05-14 NOTE — Progress Notes (Signed)
The patient was seen and evaluated with the vascular surgery resident, Dr. Linda Hedgesaples, who has documented the history, examination findings, and our plan. I agree with the resident's documentation, as noted in the EMR records.    The patient has a large left PAA. He has mild local symptoms of fullness, but no ischemia. His priority is to be able to return to normal activity as soon as possible, so he can return to his retirement home in [place].    Imaging ordered with plan for follow up in clinic to discuss procedure planning.    I counseled the patient about the plan of care and addressed education needs. There were no barriers to learning and the patient indicated understanding of the plan of care.    Report electronically signed by   Kinnie Scalesavid L. Arita Missawson, MD, FACS  Professor, Vascular and Endovascular Surgery  PI# [phone]  Pager [phone]

## 2016-05-14 NOTE — Telephone Encounter (Signed)
From: [name]  Sent: 05/14/2016 11:16 AM PST  Subject: MyChart Refill Request    Hi    On Sunday I requested a refill for some of my prescriptions and I haven't heard from the pharmacy. Did I do something wrong in the way I requested them?    Kenneth Gordon

## 2016-05-15 NOTE — Telephone Encounter (Signed)
Patient has plenty of refills on plavix and momentasole. Told to call pharmacy.

## 2016-05-17 ENCOUNTER — Ambulatory Visit
Admission: RE | Admit: 2016-05-17 | Discharge: 2016-05-22 | Disposition: A | Discharge status: Home / Self Care | Payer: Medicare Other | Source: Ambulatory Visit | Attending: GEN VASCULAR SURG | Admitting: GEN VASCULAR SURG

## 2016-05-17 ENCOUNTER — Ambulatory Visit: Payer: Medicare Other

## 2016-05-17 VITALS — BP 136/84 | HR 99 | Temp 97.3°F | Resp 16 | Ht 74.0 in | Wt 177.0 lb

## 2016-05-17 VITALS — BP 136/84 | HR 99 | Temp 97.3°F | Resp 16 | Wt 177.0 lb

## 2016-05-17 DIAGNOSIS — I724 Aneurysm of artery of lower extremity: Principal | ICD-10-CM

## 2016-05-17 NOTE — Nursing Note (Signed)
vitals taken, allergies verified, screened for pain.Kenneth Gordon/MAII  Thank you

## 2016-05-17 NOTE — Progress Notes (Signed)
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     SUBJECTIVE  [name] is doing well. He has no complaints. The pain and swelling behind his left knee has resolved. He is able to walk without limitation. He has not noticed any discoloration of his feet or toes. His right groin puncture site has no issues. He is hoping to return home as soon as we deem it safe.     Anti-platelet: ASA and Plavix  Anti-coag: none  Statin Use: yes    PHYSICAL EXAMINATION  Vitals:    05/17/16 1043   BP: 136/84   Pulse: 99   Resp: 16   Temp: 36.3 C (97.3 F)   TempSrc: Tympanic   SpO2: 98%   Weight: 80.3 kg (177 lb 0.5 oz)   Height: 1.88 m (6\' 2" )     General: NAD, pleasant, cooperative  Neck: No carotid bruit.  Chest: RRR, no murmurs. CTAB, no w/r/r.  Abdomen: Soft, nontender, nondistended.  Extremites: Warm, 5/5 movement. no ulcerations, lesions, or infarctions of the left toes/foot. Scattered psoriatic plaques.   Vascular Exam:   Carotid Rad Fem Pop PT DP ABI   RIGHT Present (no bruit) Present Present (no bruit) Present Present Present 1.0   LEFT Present (no bruit) Present Present (no bruit) Difficult to palpate Present but weaker than the right Present, but much weaker than on the right 1.03        NONINVASIVE TEST  Left lower extremity duplex: Tech read: L distal SFA with >50% stenosis. Popliteal stent patent with but with evidence of intraluminal echos and narrowing of the flow lumen. Arterial flow is present within the aneurysm sac.     Discussed images with Dr. Larwance SachsPevec who read the ultrasound as normal tapering of the flow within the stent as it meets the smaller native artery and the color specs concerning for  flow in the aneurysm sac were most likely artifact. In review of the arteriogram images from stent placement, he determined the AT was small and narrowed in multiple locations such that a diminished or absent DP pulse was expected and his normal ABI and lack of symptoms were reassuring.    ASSESSMENT / PLAN  Kenneth Gordon is a [age]yr male presenting for follow up of left popliteal artery aneurysm stenting with normal changes on ultrasound.   1. Continue exercise plan, ASA, and statin.  2. Continue abstaining from smoking.   3. Return to clinic in 6 months with surveillance LLE duplex ultrasound. May return to [place] at this time.   4. Patient discussed with the attending physician, Dr. Larwance SachsPevec, who is in agreement with the treatment plan.    Completed by:   Darrick Pennaachel Thayer Inabinet, MD, MAS, NHDP-BC  Resident Surgeon  PGY 7, PI # [phone]  Pager [phone]  Vascular Pager: [phone].

## 2016-05-20 ENCOUNTER — Encounter: Payer: Self-pay | Admitting: Family Medicine

## 2016-05-21 MED ORDER — LISINOPRIL 20 MG TABLET
20.0000 mg | ORAL_TABLET | Freq: Every day | ORAL | 1 refills | Status: DC
Start: 2016-05-21 — End: 2017-01-28

## 2016-05-21 NOTE — Telephone Encounter (Signed)
Last ov 04/18/16

## 2016-10-28 ENCOUNTER — Ambulatory Visit: Payer: Medicare Other

## 2016-10-28 ENCOUNTER — Ambulatory Visit (HOSPITAL_BASED_OUTPATIENT_CLINIC_OR_DEPARTMENT_OTHER)
Admission: RE | Admit: 2016-10-28 | Discharge: 2016-10-28 | Disposition: A | Discharge status: Home / Self Care | Payer: Medicare Other | Source: Ambulatory Visit | Attending: GEN VASCULAR SURG | Admitting: GEN VASCULAR SURG

## 2016-10-28 ENCOUNTER — Ambulatory Visit (HOSPITAL_BASED_OUTPATIENT_CLINIC_OR_DEPARTMENT_OTHER): Payer: Medicare Other | Admitting: GEN VASCULAR SURG

## 2016-10-28 ENCOUNTER — Inpatient Hospital Stay: Admission: RE | Admit: 2016-10-28 | Payer: Medicare Other | Source: Ambulatory Visit | Admitting: GEN VASCULAR SURG

## 2016-10-28 VITALS — BP 159/91 | HR 80 | Temp 96.0°F | Resp 16 | Wt 176.1 lb

## 2016-10-28 DIAGNOSIS — I70222 Atherosclerosis of native arteries of extremities with rest pain, left leg: Secondary | ICD-10-CM

## 2016-10-28 DIAGNOSIS — I724 Aneurysm of artery of lower extremity: Principal | ICD-10-CM

## 2016-10-28 DIAGNOSIS — I771 Stricture of artery: Secondary | ICD-10-CM

## 2016-10-28 DIAGNOSIS — Z95828 Presence of other vascular implants and grafts: Secondary | ICD-10-CM

## 2016-10-28 DIAGNOSIS — I70202 Unspecified atherosclerosis of native arteries of extremities, left leg: Secondary | ICD-10-CM

## 2016-10-28 LAB — CBC WITH DIFFERENTIAL
BASOPHILS % AUTO: 0.7 %
BASOPHILS ABS AUTO: 0.1 10*3/uL (ref 0.0–0.2)
EOSINOPHIL % AUTO: 1.6 %
EOSINOPHIL ABS AUTO: 0.1 10*3/uL (ref 0.0–0.5)
HEMATOCRIT: 46.6 % (ref 41.0–53.0)
HEMOGLOBIN: 15.2 g/dL (ref 13.5–17.5)
LYMPHOCYTE ABS AUTO: 1.2 10*3/uL (ref 1.0–4.8)
LYMPHOCYTES % AUTO: 15 %
MCH: 29.7 pg (ref 27.0–33.0)
MCHC: 32.6 % (ref 32.0–36.0)
MCV: 91 UM3 (ref 80.0–100.0)
MONOCYTES % AUTO: 9.8 %
MPV: 8.1 UM3 (ref 6.8–10.0)
Monocytes Abs Auto: 0.8 10*3/uL (ref 0.1–0.8)
NEUTROPHIL ABS AUTO: 6 10*3/uL (ref 1.8–7.7)
NEUTROPHILS % AUTO: 72.9 %
PLATELET COUNT: 210 10*3/uL (ref 130–400)
RDW: 15.5 % — AB (ref 0.0–14.7)
RED CELL COUNT: 5.13 10*6/uL (ref 4.50–5.90)
WHITE BLOOD CELL COUNT: 8.2 10*3/uL (ref 4.5–11.0)

## 2016-10-28 LAB — APTT STUDIES: APTT: 30.1 s (ref 24.1–36.7)

## 2016-10-28 LAB — BASIC METABOLIC PANEL
CALCIUM: 9.3 mg/dL (ref 8.6–10.5)
CARBON DIOXIDE TOTAL: 28 mmol/L (ref 24–32)
CHLORIDE: 103 mmol/L (ref 95–110)
CREATININE BLOOD: 0.97 mg/dL (ref 0.44–1.27)
GLUCOSE: 101 mg/dL — AB (ref 70–99)
POTASSIUM: 5.4 mmol/L — AB (ref 3.3–5.0)
SODIUM: 140 mmol/L (ref 135–145)
UREA NITROGEN, BLOOD (BUN): 14 mg/dL (ref 8–22)

## 2016-10-28 LAB — INR: INR: 1.02 (ref 0.87–1.18)

## 2016-10-28 LAB — FIBRINOGEN: FIBRINOGEN: 413 mg/dL — AB (ref 179–395)

## 2016-10-28 NOTE — Nursing Note (Signed)
Vital signs taken, allergies verified, screened for pain,  Chelsei Mcchesney, MA

## 2016-10-28 NOTE — Nursing Note (Signed)
Mr Kenneth Gordon has opted not to be admitted today but instead wishes to be admitted tomorrow.  He is aware that Dr Arita Missawson may not be the MD finishing his procedure.  A HAR will be requested tomorrow morning.

## 2016-10-28 NOTE — Progress Notes (Signed)
VASCULAR SURGERY CLINIC NOTE    Patient: Kenneth Gordon Attending: Wynetta Fines, MD PI# [phone]   MRN: [id number] Date of Visit: 10/28/2016      IDENTIFYING DATA /  CHIEF COMPLAINT  Kenneth Gordon is a 71yr male presenting to clinic today for follow up for L popliteal artery aneurysm stent    SUBJECTIVE  71 year old male presenting for six-month follow-up popliteal artery aneurysm stent of the left leg.  Reports 2 weeks ago developed left calf tenderness at rest with claudication as well as tingling sensation in the distal aspect of his toes.  Patient reports that the tenderness and claudication are no longer present but the tingling sensation is still there.  Patient reports that he still smokes half a pack a day and is taking aspirin and Plavix.    Anti-platelet: ASA Plavix  Anti-coag: None  Statin Use: None    PHYSICAL EXAMINATION  Vitals:    10/28/16 0913 10/28/16 0931   BP: (!) 159/91 (!) 159/91   Pulse: 80 80   Resp: 16    Temp: (!) 35.6 C (96 F)    TempSrc: Tympanic    Weight: 79.9 kg (176 lb 2.4 oz)      General: NAD, pleasant, cooperative  Neck: No carotid bruit.  Cardiac: RRR, no murmurs.   Resp: CTAB, no w/r/r.  Abdomen: Soft, nontender, nondistended.  Extremites: Warm, 5/5 strength and diminished sensation in distal toes with slight violaceous discoloration  Vascular Exam:   Carotid Rad Fem Pop PT DP   RIGHT Present (no bruit) Present Present (no bruit) Present Present Present   LEFT Present (no bruit) Present Present (no bruit)  Not present   Not present  N ot present        NONINVASIVE TEST    Arterial Duplex of Left Leg:    Department: [facility]  Patient: [id number] Kenneth, Gordon)  Account Number: [id number]  CPT Code: 30865  ICD-9:   Referring Physician: Wynetta Fines, MD, RPVI    Indication:  Patient presents with left popliteal artery aneurysm repair with stent, and is here for 26-month   follow-up evaluation. Patient states of left calf tenderness at rest and with ambulation of    approximately 1 block. Evaluate for stent patency/defects.    Findings:    RIGHT         Waveform                  Pressure ABI/TBI   Brachial                               158        Dorsalis Pedis    Biphasic                    152   0.96   Post. Tibial (Ankle) Biphasic                    148        Great Toe       Delayed upstroke without a dicrotic notch    100   0.63     LEFT         Waveform       Pressure ABI/TBI   Brachial                     150  Dorsalis Pedis    Inaudible                  Post. Tibial (Ankle) Inaudible                  Great Toe       No Upstroke Detected     0   0.00       RIGHT         Other     Dorsalis Pedis    Biphasic     LEFT         Stent/Graft  PSV Other       Proximal CFA             160          Mid CFA               105          Distal CFA              87          Proximal PFA               Not assessed   Proximal SFA             121          Proximal SFA level 2         74          Proximal SFA level 3         64          Proximal SFA level 4         70          Proximal SFA level 5         60          Mid SFA                59          Mid SFA level 2            65          Mid SFA level 3            72          Distal SFA              92          Dist SFA level 2           85          Dist SFA level 3           27          Dist SFA level 4           18          Dist SFA level 5           23           Dist SFA level 5 (2)         18          Dist SFA level 5 (3)         23          Dist SFA level 5 (4)         61          Dist SFA level 5 (5)  288          Dist SFA level 5 (6)          0          Dist SFA level 5 (7) Stent Inflow   0          Proximal Popliteal  Stent      0          Prox Pop level 2   Stent      0          Mid Popliteal     Stent      0          Mid Pop level 2    Stent Outflow  0          Distal Popliteal            0          Trunk                   No Flow      Proximal ATA               No Flow      Proximal PTA             14          Proximal PER A              Not assessed   Dorsalis Pedis              Inaudible           LEFT -  The common femoral and proximal to mid superficial femoral artery are patent. Mild, heterogenous   plaque is visualized.    *Hypoechoic intraluminal echoes is visualized in the distal superficial femoral artery and   throughout the stented popliteal artery. There is no flow identified using color flow and spectral   Doppler. There is no flow identified in the distal popliteal artery, the origin of the anterior   tibial artery, and in the tibio-peroneal trunk. Minimal arterial Doppler flow signals is obtained   from the proximal posterior tibial artery. The peroneal artery is not assessed.    Resting Ankle/Arm and Toe/Arm Ratio Waveform Analysis    RIGHT:  Biphasic arterial Doppler waveforms are obtained from the dorsalis pedis and posterior tibial   arteries. Delayed systolic upstroke with no dichrotic notch present from photoplethysmographic   arterial waveforms of the great toe.    LEFT:  The anterior tibial and posterior tibial arteries are inaudible at the ankle.  There are no systolic  upstrokes identified with photoplethysmographic arterial waveforms of the great toe.    Previous ankle/arm and toe/arm ratios are as follows:  (05/17/2016) RIGHT LEFT  Ankle/arm ratio: 1.0 1.03  Toe/arm ratio: 0.69 0.33     ASSESSMENT / PLAN  Kenneth Gordon is a 71yr male presenting with symptomatic occlusion of left popliteal artery stent as well as occluded distal SFA.    -Disappearance of pain likely due to vascularization of distal tissues with collateral vessels  -Recommend Angiogram today to further evaluate status of stent  -Patient should undergo thrombolysis with tpa infusion catheter today as resolution become less likely as the clot matures  -Coags, fibrinogen, CBC, BMP today  -Patient would like to speak with his wife before making a final decision    Patient seen and examined with the attending physician who is in agreement with the treatment  plan.    Completed by:  Broadus JohnScott Kazimierz Springborn, MD, PI #: [id number] [id number]  Vascular Pager: [phone].

## 2016-10-28 NOTE — Nursing Note (Signed)
Patient instructed to eat an early light breakfast and take in only clear liquids thereafter.

## 2016-10-29 ENCOUNTER — Telehealth: Payer: Self-pay | Admitting: GEN VASCULAR SURG

## 2016-10-29 ENCOUNTER — Inpatient Hospital Stay
Admission: AD | Admit: 2016-10-29 | Discharge: 2016-11-02 | DRG: 271 | Disposition: A | Discharge status: Home / Self Care | Payer: Medicare Other | Source: Ambulatory Visit | Attending: Surgical Critical Care | Admitting: Surgical Critical Care

## 2016-10-29 DIAGNOSIS — T82856A Stenosis of peripheral vascular stent, initial encounter: Principal | ICD-10-CM | POA: Diagnosis present

## 2016-10-29 DIAGNOSIS — I743 Embolism and thrombosis of arteries of the lower extremities: Secondary | ICD-10-CM | POA: Diagnosis present

## 2016-10-29 DIAGNOSIS — I724 Aneurysm of artery of lower extremity: Secondary | ICD-10-CM

## 2016-10-29 DIAGNOSIS — I10 Essential (primary) hypertension: Secondary | ICD-10-CM | POA: Diagnosis present

## 2016-10-29 DIAGNOSIS — Y712 Prosthetic and other implants, materials and accessory cardiovascular devices associated with adverse incidents: Secondary | ICD-10-CM | POA: Diagnosis present

## 2016-10-29 DIAGNOSIS — Z7982 Long term (current) use of aspirin: Secondary | ICD-10-CM | POA: Insufficient documentation

## 2016-10-29 DIAGNOSIS — F17210 Nicotine dependence, cigarettes, uncomplicated: Secondary | ICD-10-CM | POA: Diagnosis present

## 2016-10-29 DIAGNOSIS — Z7902 Long term (current) use of antithrombotics/antiplatelets: Secondary | ICD-10-CM | POA: Insufficient documentation

## 2016-10-29 HISTORY — DX: Chronic obstructive pulmonary disease, unspecified: J44.9

## 2016-10-29 LAB — CBC NO DIFFERENTIAL
HEMATOCRIT: 44 % (ref 41.0–53.0)
HEMOGLOBIN: 14.9 g/dL (ref 13.5–17.5)
HEMOGLOBIN: 14.9 g/dL (ref 13.5–17.5)
Hematocrit: 44.2 % (ref 41.0–53.0)
MCH: 30.9 pg (ref 27.0–33.0)
MCH: 30.5 pg (ref 27.0–33.0)
MCHC: 33.8 % (ref 32.0–36.0)
MCHC: 33.7 % (ref 32.0–36.0)
MCV: 91.3 UM3 (ref 80.0–100.0)
MCV: 90.6 UM3 (ref 80.0–100.0)
MPV: 8 UM3 (ref 6.8–10.0)
MPV: 7.6 UM3 (ref 6.8–10.0)
PLATELET COUNT: 206 10*3/uL (ref 130–400)
PLATELET COUNT: 189 10*3/uL (ref 130–400)
RDW: 15.6 % — ABNORMAL HIGH (ref 0.0–14.7)
RDW: 15.1 % — AB (ref 0.0–14.7)
RED CELL COUNT: 4.84 10*6/uL (ref 4.50–5.90)
RED CELL COUNT: 4.86 10*6/uL (ref 4.50–5.90)
WHITE BLOOD CELL COUNT: 8.4 10*3/uL (ref 4.5–11.0)
WHITE BLOOD CELL COUNT: 7.1 10*3/uL (ref 4.5–11.0)

## 2016-10-29 LAB — URINALYSIS AND CULTURE IF IND
BILIRUBIN URINE: NEGATIVE
GLUCOSE URINE: NEGATIVE mg/dL
KETONES: 20 mg/dL — AB
LEUK. ESTERASE: NEGATIVE
NITRITE URINE: NEGATIVE
OCCULT BLOOD URINE: NEGATIVE mg/dL
PH URINE: 5 (ref 4.8–7.8)
PROTEIN URINE: NEGATIVE mg/dL
SPECIFIC GRAVITY, URINE: 1.01 (ref 1.002–1.030)
UROBILINOGEN.: 2 mg/dL (ref ?–2.0)

## 2016-10-29 LAB — BASIC METABOLIC PANEL
CARBON DIOXIDE TOTAL: 27 mmol/L (ref 24–32)
CHLORIDE: 100 mmol/L (ref 95–110)
Calcium: 8.8 mg/dL (ref 8.6–10.5)
Creatinine Serum: 0.9 mg/dL (ref 0.44–1.27)
GLUCOSE: 88 mg/dL (ref 70–99)
POTASSIUM: 4.6 mmol/L (ref 3.3–5.0)
SODIUM: 137 mmol/L (ref 135–145)
UREA NITROGEN, BLOOD (BUN): 12 mg/dL (ref 8–22)

## 2016-10-29 LAB — INR
INR: 1.03 (ref 0.87–1.18)
INR: 1.02 (ref 0.87–1.18)

## 2016-10-29 LAB — BASELINE ANTI-XA HEPARIN ASSAY

## 2016-10-29 LAB — APTT STUDIES: APTT: 28.5 s (ref 24.1–36.7)

## 2016-10-29 MED ORDER — HEPARIN (PORCINE) 25,000 UNIT/250 ML (100 UNIT/ML) IN DEXTROSE 5 % IV
18.0000 [IU]/kg/h | INTRAVENOUS | Status: DC
Start: 2016-10-29 — End: 2016-10-30
  Administered 2016-10-29: 18 [IU]/kg/h via INTRAVENOUS
  Filled 2016-10-29: qty 1

## 2016-10-29 MED ORDER — CLOPIDOGREL 75 MG TABLET
75.0000 mg | ORAL_TABLET | Freq: Every day | ORAL | Status: DC
Start: 2016-10-30 — End: 2016-11-01
  Administered 2016-10-30 – 2016-11-01 (×3): 75 mg via ORAL
  Filled 2016-10-29 (×3): qty 1

## 2016-10-29 MED ORDER — LISINOPRIL 40 MG TABLET
20.0000 mg | ORAL_TABLET | Freq: Every day | ORAL | Status: DC
Start: 2016-10-30 — End: 2016-11-02
  Administered 2016-10-30 – 2016-11-02 (×4): 20 mg via ORAL
  Filled 2016-10-29 (×4): qty 1

## 2016-10-29 MED ORDER — BUPROPION HCL SR 150 MG TABLET,12 HR SUSTAINED-RELEASE
150.0000 mg | ORAL_TABLET | Freq: Two times a day (BID) | ORAL | Status: DC
Start: 2016-10-29 — End: 2016-11-02
  Administered 2016-10-29: 150 mg via ORAL
  Filled 2016-10-29 (×4): qty 1

## 2016-10-29 MED ORDER — ASPIRIN 81 MG CHEWABLE TABLET
81.0000 mg | CHEWABLE_TABLET | Freq: Every day | ORAL | Status: DC
Start: 2016-10-30 — End: 2016-11-02
  Administered 2016-10-30 – 2016-11-02 (×4): 81 mg via ORAL
  Filled 2016-10-29 (×4): qty 1

## 2016-10-29 MED ORDER — BUPROPION HCL 150 MG TABLET,12 HR SUSTAINED-RELEASE(SMOKING DETERRENT)
150.0000 mg | EXTENDED_RELEASE_CAPSULE | Freq: Two times a day (BID) | ORAL | Status: DC
Start: 2016-10-29 — End: 2016-10-29
  Filled 2016-10-29 (×2): qty 1

## 2016-10-29 NOTE — H&P (Addendum)
VASCULAR SURGERY  HISTORY AND PHYSICAL  CC: LLE claudication    HPI: 71 year old man s/p Left popliteal artery aneurysm stenting in February 2018 was admitted to the hospital for planned LLE angiography and thrombolysis after arterial duplex yesterday demonstrated occlusion of his graft and L SFA. Patient was seen in clinic yesterday for six-month follow-up of his popliteal artery aneurysm stent of the left leg. He reported that 2 weeks ago he developed left calf tenderness at rest with claudication as well as a tingling sensation in the distal aspect of his toes.  Patient reported that the tenderness and claudication are no longer present but the tingling sensation is still there. Patient reports that he still smokes half a pack a day and is taking aspirin and Plavix.    PMHx:  Past Medical History:   Diagnosis Date    Allergic rhinitis     COPD (chronic obstructive pulmonary disease)     Essential hypertension     NO SIGNIFICANT HISTORY (aka NONE)     Psoriasis     Tobacco dependence    :    PSHx:  Past Surgical History:   Procedure Laterality Date    NO SURGICAL HISTORY         FHx:  Family History   Problem Relation Age of Onset    Diabetes Son        SHx:  Social History     Social History    Marital status: DIVORCED     Spouse name: N/A    Number of children: N/A    Years of education: N/A     Social History Main Topics    Smoking status: Current Every Day Smoker     Packs/day: 0.50     Years: 30.00     Types: Cigarettes    Smokeless tobacco: Never Used      Comment: needs  counseling    Alcohol use No      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      Current Outpatient Prescriptions on File Prior to Encounter   Medication Sig Dispense Refill    Albuterol (PROAIR HFA, PROVENTIL HFA, VENTOLIN  HFA) 90 mcg/actuation inhaler Take 2 puffs by inhalation every 4 hours if needed for wheezing. 17 g 1    Aspirin 81 mg Chewable Tablet Take 81 mg by mouth every day.      ASPIRIN PO Take 100 mg by mouth every day.      buPROPion HCl, smoking deter, 150 mg ER 12 hr Tablet Take 1 tablet by mouth 2 times daily. 60 tablet 2    Clopidogrel (PLAVIX) 75 mg Tablet Take 1 tablet by mouth every morning. 30 tablet 5    Lisinopril (PRINIVIL, ZESTRIL) 20 mg Tablet Take 1 tablet by mouth every day. (blood pressure) 90 tablet 1    Mometasone-Formoterol 100-5 mcg/actuation HFA Aerosol Inhaler Take 1 puff by inhalation every 6 hours if needed. 1 inhaler 2       Allergies:  Review of patient's allergies indicates no known allergies.    REVIEW OF SYSTEMS  Positive for: Numbness in distal left toes  Pt denies: fevers, chills, wt loss, changes in vision, hearing  loss, paralysis, cough, sob, dyspnea with exertion, chest pain, orhtopnea, leg pain with ambulation-no longer present, change in appetite, diarrhea, constipation, dysuria, nocturia, weakness, jaundice, rash, bleeding, bruising, hot or cold intolerance.    PE:  Temp src: Oral (08/07 1900)  Temp:  [36.4 C (97.6 F)-36.6 C (97.8 F)]   Pulse:  [80-86]   BP: (165-169)/(77-88)   Resp:  [16-18]   SpO2:  [96 %]     General: NAD, pleasant, cooperative  Neck: No carotid bruit.  Cardiac: RRR, no murmurs.   Resp: CTAB, no w/r/r.  Abdomen: Soft, nontender, nondistended.  Extremites: Warm, 5/5 strength and diminished sensation in distal toes with slight violaceous discoloration    Vascular Exam:   Carotid Rad Fem Pop PT DP   RIGHT Present (no bruit) Present Present (no bruit) Present Present Present   LEFT Present (no bruit) Present Present (no bruit)  Not present   Not present  Not present        LABORATORY DATA:  Lab Results   Lab Name Value Date/Time    WBC 7.1 10/29/2016 09:25 PM    WBC 8.2 12/30/2014 07:14 AM    HGB 14.9 10/29/2016 09:25 PM    HGB 16.5 12/30/2014 07:14 AM     HCT 44.0 10/29/2016 09:25 PM    HCT 49.6 12/30/2014 07:14 AM    PLT 189 10/29/2016 09:25 PM    PLT 197 12/30/2014 07:14 AM       Lab Results   Lab Name Value Date/Time    NA 137 10/29/2016 06:21 PM    NA 139 12/30/2014 07:14 AM    K 4.6 10/29/2016 06:21 PM    K 4.3 12/30/2014 07:14 AM    CL 100 10/29/2016 06:21 PM    CL 100 12/30/2014 07:14 AM    CO2 27 10/29/2016 06:21 PM    CO2 30 12/30/2014 07:14 AM    BUN 12 10/29/2016 06:21 PM    BUN 17 12/30/2014 07:14 AM    CR 0.90 10/29/2016 06:21 PM    CR 0.97 12/30/2014 07:14 AM    GLU 88 10/29/2016 06:21 PM    GLU 89 12/30/2014 07:14 AM       INR   Recent labs for the past 24 hours     10/29/16 2125 10/29/16 1821    INR 1.02 1.03        RADIOLOGY:  Department: [facility]  Patient: [id number] PESACH, FRISCH)  Account Number: [id number]  CPT Code: 45409  ICD-9:   Referring Physician: Wynetta Fines, MD, RPVI    Indication:  Patient presents with left popliteal artery aneurysm repair with stent, and is here for 53-month   follow-up evaluation. Patient states of left calf tenderness at rest and with ambulation of   approximately 1 block. Evaluate for stent patency/defects.    Findings:    RIGHT         Waveform                  Pressure ABI/TBI   Brachial                               158        Dorsalis Pedis    Biphasic                    152   0.96   Post. Tibial (Ankle) Biphasic  148        Great Toe       Delayed upstroke without a dicrotic notch    100   0.63     LEFT         Waveform       Pressure ABI/TBI   Brachial                     150        Dorsalis Pedis    Inaudible                  Post. Tibial (Ankle) Inaudible                  Great Toe       No Upstroke Detected     0   0.00       RIGHT         Other      Dorsalis Pedis    Biphasic     LEFT         Stent/Graft  PSV Other       Proximal CFA             160          Mid CFA               105          Distal CFA              87          Proximal PFA               Not assessed   Proximal SFA             121          Proximal SFA level 2         74          Proximal SFA level 3         64          Proximal SFA level 4         70          Proximal SFA level 5         60          Mid SFA                59          Mid SFA level 2            65          Mid SFA level 3            72          Distal SFA              92          Dist SFA level 2           85          Dist SFA level 3           27          Dist SFA level 4           18          Dist SFA level 5           23  Dist SFA level 5 (2)         18          Dist SFA level 5 (3)         23          Dist SFA level 5 (4)         61          Dist SFA level 5 (5)         288          Dist SFA level 5 (6)          0          Dist SFA level 5 (7) Stent Inflow   0          Proximal Popliteal  Stent      0          Prox Pop level 2   Stent      0          Mid Popliteal     Stent      0          Mid Pop level 2    Stent Outflow  0          Distal Popliteal            0          Trunk                   No Flow      Proximal ATA               No Flow      Proximal PTA             14          Proximal PER A              Not  assessed   Dorsalis Pedis              Inaudible           LEFT -  The common femoral and proximal to mid superficial femoral artery are patent. Mild, heterogenous   plaque is visualized.    *Hypoechoic intraluminal echoes is visualized in the distal superficial femoral artery and   throughout the stented popliteal artery. There is no flow identified using color flow and spectral   Doppler. There is no flow identified in the distal popliteal artery, the origin of the anterior   tibial artery, and in the tibio-peroneal trunk. Minimal arterial Doppler flow signals is obtained   from the proximal posterior tibial artery. The peroneal artery is not assessed.    Resting Ankle/Arm and Toe/Arm Ratio Waveform Analysis    RIGHT:  Biphasic arterial Doppler waveforms are obtained from the dorsalis pedis and posterior tibial   arteries. Delayed systolic upstroke with no dichrotic notch present from photoplethysmographic   arterial waveforms of the great toe.    LEFT:  The anterior tibial and posterior tibial arteries are inaudible at the ankle. There are no systolic  upstrokes identified with photoplethysmographic arterial waveforms of the great toe.    Previous ankle/arm and toe/arm ratios are as follows:  (05/17/2016) RIGHT LEFT  Ankle/arm ratio: 1.0 1.03  Toe/arm ratio: 0.69 0.33      IMPRESSION:    PATENT LEFT COMMON FEMORAL ARTERY WITHOUT EVIDENCE STENOSIS.    OCCLUDED DISTAL SUPERFICIAL FEMORAL ARTERY AND POPLITEAL ARTERY STENT. THERE IS  NO FLOW WITHIN THE   DISTAL POPLITEAL ARTERY AND ORIGIN OF THE TIBIAL ARTERIES.    NO FLOW TO THE LEFT ANKLE BY ANKLE BRACHIAL INDEX.    MILD ARTERIAL INSUFFICIENCY OF THE RIGHT LOWER EXTREMITY    Electronically Signed by: Valerie Salts, MD, RPVI on 2016-10-28 07:09:45 PM  End of Report      ASSESSMENT & PLAN:  RIGBY LEONHARDT is a 65yr male presenting with symptomatic occlusion of left popliteal artery stent as well as occluded distal  SFA    #Left Popliteal stent and SFA occlusion  -IV Heparin 18units/kg/hr  -Angiogram tomorrow with tPA infusion thrombolysis  -ASA 81mg   -Plavix 75mg   -Patient counseled on importance of not smoking, taking Bupropion    #HTN  -Lisinopril 20mg  Qday    #Dispo  -Med/Surg floor      Broadus John, MD  PGY1  PII: [phone], Pager: [facility]  Vascular Surgery Service Pager: [phone]    Vascular Surgery Staff    This patient was seen in the clinic, evaluated, and care plan was developed with the resident . I have personally reviewed the available clinical information.    I agree with the assessment and plan as outlined in the resident's note.    The patient developed occlusion of the covered stents that were used to treat the left PAA. He has severe distal ischemia, but the rest pain and sensory changes have abated.    Immediate admission for evaluation and treatment were recommended, but the patient elected to delay.      Report electronically signed by   Kinnie Scales. [name], MD, FACS  Professor, Vascular and Endovascular Surgery  PI# [id number]  Pager [phone]

## 2016-10-29 NOTE — RN Handoff (Signed)
[facility]      Kenneth HamperRobert G Hassell, Jun 25, 1945, [id number]  Physician Name: Arita Missawson  Diagnosis: Occluded left popliteal stent  Current condition: stable  Recent/anticipated changes in condition or treatment: Patient with complaints of left calf pain and claudication.  Numbness of toes. Vl study shows politeal stent occlusion  Recent medications received and response: Will need angiogram amd clot to be removed  What to watch for in the next interval of care: Angio, possible thrombolysis.  Lab work done yesterday  Patient belongings: with patient    Telephone report given to [facility] at 3:19 PM by Greer EeKaren Kanetra Ho, RN, call back number [phone].  Receiving nurse has no further questions at this time.  Greer EeKaren Madex Seals, RN

## 2016-10-29 NOTE — Telephone Encounter (Signed)
Patient to be admitted for an occluded stent.  HAR placed earlier this morning.  Have been awaiting bed.

## 2016-10-29 NOTE — Nurse Assessment (Signed)
ADMIT NURSING NOTE    Note Started: 10/29/2016, 17:38     Report received from Clydie BraunKaren, vascular clinic staff. Patient admitted at 1610 hours as a direct admit.  Pt condition stable, alert x 4, denies any pain, except for slight tightness on the L calf . Patient oriented to room and unit. MD notified of patient's arrival on unit . Admission Assessment and Plan of Care initiated. Elmer Pickerowena Damion Kant, RN RN

## 2016-10-29 NOTE — Nurse Assessment (Signed)
ASSESSMENT NOTE    Note Started: 10/29/2016, 19:00     Initial assessment completed and recorded in EMR.  Report received from day shift nurse and orders reviewed. Plan of Care reviewed and updated, discussed with patient.  Wayne Bothoberto Taneisha Fuson, RN

## 2016-10-29 NOTE — Plan of Care (Signed)
Problem: Patient Care Overview (Adult)  Goal: Plan of Care Review  Outcome: Ongoing (interventions implemented as appropriate)   10/29/16 1751   OTHER   Plan Of Care Reviewed With patient   Plan of Care Review   Progress progress toward functional goals as expected   Goal Outcome Evaluation Note    Kenneth Gordon is a 43yrmale admitted 10/29/2016    OUTCOME SUMMARY AND PLAN MOVING FORWARD:     Admitted today, admission process completed. Waiting for admission orders. Seen by vascular team.  Goal: Individualization and Mutuality  Outcome: Ongoing (interventions implemented as appropriate)    Goal: Discharge Needs Assessment  Outcome: Ongoing (interventions implemented as appropriate)      Problem: Sleep Pattern Disturbance (Adult)  Goal: Identify Related Risk Factors and Signs and Symptoms  Related risk factors and signs and symptoms are identified upon initiation of Human Response Clinical Practice Guideline (CPG)   Outcome: Outcome(s) achieved Date Met: 10/29/16   10/29/16 1751   Sleep Pattern Disturbance   Sleep Pattern Disturbance: Related Risk Factors medication effects;hospital routine/care;pain/discomfort;unfamiliar surroundings   Signs and Symptoms (Sleep Pattern Disturbance) difficulty performing routine tasks     Goal: Adequate Sleep/Rest  Patient will demonstrate the desired outcomes by discharge/transition of care.   Outcome: Ongoing (interventions implemented as appropriate)      Problem: Activity Intolerance (Adult)  Goal: Identify Related Risk Factors and Signs and Symptoms  Related risk factors and signs and symptoms are identified upon initiation of Human Response Clinical Practice Guideline (CPG)  Outcome: Outcome(s) achieved Date Met: 10/29/16   10/29/16 1751   Activity Intolerance   Activity Intolerance: Related Risk Factors generalized weakness;insufficient sleep/rest;sedentary lifestyle   Signs and Symptoms (Activity Intolerance) pain/discomfort     Goal: Activity Tolerance  Patient will  demonstrate the desired outcomes by discharge/transition of care.  Outcome: Ongoing (interventions implemented as appropriate)    Goal: Effective Energy Conservation Techniques  Patient will demonstrate the desired outcomes by discharge/transition of care.  Outcome: Ongoing (interventions implemented as appropriate)

## 2016-10-29 NOTE — Progress Notes (Addendum)
VASCULAR SURGERY DAILY PROGRESS NOTE  14:07 10/30/2016   Vascular Attending: Wynetta Fines, MD PI# [id number]  LOS: 1     ID: Kenneth Gordon is a 71yr old man with occluded left popliteal stent and SFA admitted on 8/7 for aniogram and thrombolysis with tPa infusion.     PROCEDURES (include date):  None    INTERVAL HISTORY/SUBJECTIVE:   NAE, not in pain   On heparin drip   On NPO since mn in anticipation of procedure today    OBJECTIVE:   Vital Signs   Current  Minimum Maximum   BP BP: 156/86  BP: (149-170)/(77-88)    Temp Temp: 36.6 C (97.8 F)  Temp Min: 36.4 C (97.6 F)  Temp Max: 36.8 C (98.2 F)    Pulse Pulse: 74 Pulse Min: 74  Pulse Max: 86    Resp Resp: 16 Resp Min: 16  Resp Max: 18    O2 Sat SpO2: 98 % SpO2 Min: 96 % SpO2 Max: 98 %     Intake and Output: Last Two Completed Shifts:  I/O Last 2 Completed Shifts:  In: 300 [Oral:300]  Out: 250 [Urine:250]    Intake/Output Summary (Last 24 hours) at 10/30/16 1407  Last data filed at 10/29/16 2219   Gross per 24 hour   Intake              300 ml   Output              250 ml   Net               50 ml       PHYSICAL EXAM:  General: NAD, pleasant, cooperative  Neck: No carotid bruit.  Cardiac: RRR, no murmurs.   Resp:CTAB, no w/r/r.  Abdomen: Soft, nontender, nondistended.  Extremites: Warm, Diminished sensation in distal toes with slight violaceous discoloration    LAB TESTS/STUDIES:       Recent labs for the past 72 hours     10/29/16 1821 10/28/16 1104    SODIUM 137 140    POTASSIUM 4.6 5.4*    CHLORIDE 100 103    CARBON DIOXIDE TOTAL 27 28    UREA NITROGEN, BLOOD (BUN) 12 14    CREATININE BLOOD 0.90 0.97    GLUCOSE 88 101*    CALCIUM 8.8 9.3    MAGNESIUM (MG) -- --    PHOSPHORUS (PO4) -- --    ALBUMIN -- --        Recent labs for the past 72 hours     10/29/16 2125 10/29/16 1821 10/28/16 1104    WHITE BLOOD CELL COUNT 7.1 8.4 8.2    HEMOGLOBIN 14.9 14.9 15.2    HEMATOCRIT 44.0 44.2 46.6    PLATELET COUNT 189 206 210        Recent labs for the past 72 hours      10/29/16 2125 10/29/16 1821 10/28/16 1104    INR 1.02 1.03 1.02      Recent labs for the past 72 hours     10/29/16 1821 10/28/16 1104    PROTHROMBIN TIME -- --    APTT 28.5 30.1      POC Glucose, blood: --        ASSESSMENT & PLAN:    Kenneth Gordon is a 43yr man presenting withsymptomatic occlusion of left popliteal artery stent as well as occluded distal SFA currently scheduled for angiogram and tPa infusion today (8/8).    #Left Popliteal  stent and SFA occlusion  -IV Heparin 18units/kg/hr  -Angiogram today with tPA infusion thrombolysis (8/8)  -ASA 81mg   -Plavix 75mg   -Patient counseled on importance of not smoking, taking Bupropion    #HTN  -Lisinopril 20mg  daily    #Pain  -Patient not currently experiencing pain    #Dispo  -Med/Surg floor    The above assessment and plan was discussed with and agreed upon by the Vascular Surgery Junior/Chief resident.    Valerie RoysPhillip Kim, MD  PGY-1, Department of Surgery   PI# [phone]  Service Pager: [phone]    Vascular Surgery Staff    This patient was seen, evaluated, and care plan was developed with the resident team. The case was discussed on morning rounds. I have personally reviewed the available clinical information.    I agree with the assessment and plan as outlined in the resident's note.    Report electronically signed by   Kinnie Scalesavid L. Arita Missawson, MD, FACS  Professor, Vascular and Endovascular Surgery  PI# [id number]  Pager [phone]

## 2016-10-30 ENCOUNTER — Inpatient Hospital Stay (HOSPITAL_BASED_OUTPATIENT_CLINIC_OR_DEPARTMENT_OTHER): Payer: Medicare Other

## 2016-10-30 ENCOUNTER — Inpatient Hospital Stay (HOSPITAL_COMMUNITY): Payer: Medicare Other

## 2016-10-30 DIAGNOSIS — I743 Embolism and thrombosis of arteries of the lower extremities: Secondary | ICD-10-CM

## 2016-10-30 DIAGNOSIS — I739 Peripheral vascular disease, unspecified: Secondary | ICD-10-CM

## 2016-10-30 DIAGNOSIS — T82856A Stenosis of peripheral vascular stent, initial encounter: Secondary | ICD-10-CM

## 2016-10-30 DIAGNOSIS — Z01810 Encounter for preprocedural cardiovascular examination: Secondary | ICD-10-CM

## 2016-10-30 DIAGNOSIS — I724 Aneurysm of artery of lower extremity: Secondary | ICD-10-CM

## 2016-10-30 LAB — CBC NO DIFFERENTIAL
HEMATOCRIT: 44.5 % (ref 41.0–53.0)
HEMOGLOBIN: 14.7 g/dL (ref 13.5–17.5)
MCH: 30.1 pg (ref 27.0–33.0)
MCHC: 33 % (ref 32.0–36.0)
MCV: 91.4 UM3 (ref 80.0–100.0)
MPV: 7.8 UM3 (ref 6.8–10.0)
PLATELET COUNT: 173 10*3/uL (ref 130–400)
RDW: 15.6 % — AB (ref 0.0–14.7)
RED CELL COUNT: 4.86 10*6/uL (ref 4.50–5.90)
WHITE BLOOD CELL COUNT: 6.6 10*3/uL (ref 4.5–11.0)

## 2016-10-30 LAB — FIBRINOGEN: FIBRINOGEN: 440 mg/dL — AB (ref 179–395)

## 2016-10-30 LAB — C DIFFICILE SURVEILLANCE TEST: TEST RESULT 2: NEGATIVE

## 2016-10-30 LAB — CULTURE SURVEILLANCE, MRSA

## 2016-10-30 LAB — ANTI-XA HEPARIN ASSAY
HEPARIN UFH: 0.44 U/mL
HEPARIN UFH: 0.53 U/mL

## 2016-10-30 MED ORDER — OXYCODONE 5 MG TABLET
5.0000 mg | ORAL_TABLET | ORAL | Status: DC | PRN
Start: 2016-10-30 — End: 2016-11-02
  Administered 2016-10-30 (×2): 5 mg via ORAL
  Administered 2016-11-01: 10 mg via ORAL
  Administered 2016-11-01: 5 mg via ORAL
  Filled 2016-10-30: qty 2
  Filled 2016-10-30 (×3): qty 1

## 2016-10-30 MED ORDER — DEPRECATED NACL 0.9% 250ML CARRIER
1.0000 mg/h | Status: DC
Start: 2016-10-30 — End: 2016-10-30
  Filled 2016-10-30 (×2): qty 2.5

## 2016-10-30 MED ORDER — DOCUSATE SODIUM 100 MG CAPSULE
100.0000 mg | ORAL_CAPSULE | Freq: Two times a day (BID) | ORAL | Status: DC
Start: 2016-10-30 — End: 2016-11-02
  Administered 2016-10-30 – 2016-10-31 (×2): 100 mg via ORAL
  Filled 2016-10-30 (×3): qty 1

## 2016-10-30 MED ORDER — HYDROMORPHONE 1 MG/ML INJECTION SYRINGE
0.5000 mg | INJECTION | INTRAMUSCULAR | Status: DC | PRN
Start: 2016-10-30 — End: 2016-10-30
  Administered 2016-10-30: 0.5 mg via INTRAVENOUS
  Filled 2016-10-30: qty 0.5

## 2016-10-30 MED ORDER — SENNOSIDES 8.6 MG TABLET
2.0000 | ORAL_TABLET | Freq: Every day | ORAL | Status: DC
Start: 2016-10-30 — End: 2016-11-02
  Administered 2016-10-30 – 2016-10-31 (×2): 17.2 mg via ORAL
  Filled 2016-10-30 (×2): qty 2

## 2016-10-30 MED ORDER — D5 / 0.45% NACL W KCL 20 MEQ/L IV SOLUTION
100.0000 mL/h | INTRAVENOUS | Status: DC
Start: 2016-10-30 — End: 2016-10-31
  Administered 2016-10-30 – 2016-10-31 (×3): 100 mL/h via INTRAVENOUS

## 2016-10-30 MED ORDER — HEPARIN (PORCINE) (PF) 1,000 UNIT/500 ML IN 0.9 % SODIUM CHLORIDE IV
INTRAVENOUS | Status: AC
Start: 2016-10-30 — End: 2016-10-30
  Filled 2016-10-30: qty 2

## 2016-10-30 MED ORDER — CEFAZOLIN 1 GRAM SOLUTION FOR INJECTION
INTRAMUSCULAR | Status: AC
Start: 2016-10-30 — End: 2016-10-30
  Filled 2016-10-30: qty 1

## 2016-10-30 MED ORDER — HEPARIN (PORCINE) 25,000 UNIT/250 ML (100 UNIT/ML) IN DEXTROSE 5 % IV
INTRAVENOUS | Status: AC
Start: 2016-10-30 — End: 2016-10-30
  Filled 2016-10-30: qty 1

## 2016-10-30 MED ORDER — FENTANYL (PF) 50 MCG/ML INJECTION SOLUTION
INTRAMUSCULAR | Status: AC
Start: 2016-10-30 — End: 2016-10-30
  Filled 2016-10-30: qty 4

## 2016-10-30 MED ORDER — DEPRECATED NACL 0.9% 250ML CARRIER
0.7500 mg/h | Status: DC
Start: 2016-10-30 — End: 2016-10-31
  Administered 2016-10-30 – 2016-10-31 (×5): 0.75 mg/h
  Filled 2016-10-30 (×7): qty 2.5

## 2016-10-30 MED ORDER — MIDAZOLAM (PF) 1 MG/ML INJECTION SOLUTION
INTRAMUSCULAR | Status: AC
Start: 2016-10-30 — End: 2016-10-30
  Filled 2016-10-30: qty 4

## 2016-10-30 MED ORDER — HYDROMORPHONE 2 MG TABLET
2.0000 mg | ORAL_TABLET | ORAL | Status: DC | PRN
Start: 2016-10-30 — End: 2016-10-30

## 2016-10-30 MED ORDER — GABAPENTIN 300 MG CAPSULE
300.0000 mg | ORAL_CAPSULE | Freq: Three times a day (TID) | ORAL | Status: DC
Start: 2016-10-30 — End: 2016-11-02
  Administered 2016-10-30 – 2016-11-02 (×7): 300 mg via ORAL
  Filled 2016-10-30 (×7): qty 1

## 2016-10-30 MED ORDER — ACETAMINOPHEN 500 MG TABLET
1000.0000 mg | ORAL_TABLET | Freq: Four times a day (QID) | ORAL | Status: DC
Start: 2016-10-30 — End: 2016-11-02
  Administered 2016-10-30 – 2016-11-01 (×7): 1000 mg via ORAL
  Filled 2016-10-30 (×7): qty 2

## 2016-10-30 MED ORDER — VERAPAMIL 2.5 MG/ML INTRAVENOUS SOLUTION
INTRAVENOUS | Status: AC
Start: 2016-10-30 — End: 2016-10-30
  Filled 2016-10-30: qty 2

## 2016-10-30 MED ORDER — HYDROMORPHONE 1 MG/ML INJECTION SYRINGE
0.5000 mg | INJECTION | INTRAMUSCULAR | Status: DC | PRN
Start: 2016-10-30 — End: 2016-10-31

## 2016-10-30 MED ORDER — LIDOCAINE HCL 10 MG/ML (1 %) INJECTION SOLUTION
INTRAMUSCULAR | Status: AC
Start: 2016-10-30 — End: 2016-10-30
  Filled 2016-10-30: qty 50

## 2016-10-30 MED ORDER — HEPARIN (PORCINE) 1,000 UNIT/ML INJECTION SOLUTION
INTRAMUSCULAR | Status: AC
Start: 2016-10-30 — End: 2016-10-30
  Filled 2016-10-30: qty 10

## 2016-10-30 MED ORDER — CEFAZOLIN 2 GRAM/100 ML IN DEXTROSE(ISO-OSMOTIC) INTRAVENOUS PIGGYBACK
2.0000 g | INJECTION | Freq: Three times a day (TID) | INTRAVENOUS | Status: DC
Start: 2016-10-30 — End: 2016-11-01
  Administered 2016-10-30 – 2016-11-01 (×5): 2 g via INTRAVENOUS
  Filled 2016-10-30 (×6): qty 100

## 2016-10-30 MED ORDER — HYDROMORPHONE 1 MG/ML INJECTION SYRINGE
0.2000 mg | INJECTION | INTRAMUSCULAR | Status: DC | PRN
Start: 2016-10-30 — End: 2016-10-30
  Administered 2016-10-30: 0.5 mg via INTRAVENOUS
  Filled 2016-10-30: qty 0.5

## 2016-10-30 MED ORDER — HYDRALAZINE 20 MG/ML INJECTION SOLUTION
10.0000 mg | INTRAMUSCULAR | Status: DC | PRN
Start: 2016-10-30 — End: 2016-11-01
  Administered 2016-10-30 – 2016-11-01 (×3): 10 mg via INTRAVENOUS
  Filled 2016-10-30 (×3): qty 1

## 2016-10-30 MED ORDER — HEPARIN (PORCINE) 25,000 UNIT/250 ML (100 UNIT/ML) IN DEXTROSE 5 % IV
200.0000 [IU]/h | INTRAVENOUS | Status: DC
Start: 2016-10-30 — End: 2016-10-31

## 2016-10-30 NOTE — Nurse Focus (Signed)
To cardiac cath lab after upper and lower extremity ultrasound per gurney.  Patient is alert. Patient's personal belongings and valuables sent to clothing room and safe.

## 2016-10-30 NOTE — Progress Notes (Addendum)
VASCULAR SURGERY DAILY PROGRESS NOTE  12:48 10/31/2016   Vascular Attending: Wynetta Fines, MD PI# [phone]  LOS: 2     ID: Kenneth Gordon is a 71yr old admitted for thrombolysis of occluded L popliteal stent    PROCEDURES (include date):  10/30/2016-Dr. [name]  Balloon angioplasty of the popliteal artery. Mechanical thrombectomy lysis catheter placement.    INTERVAL HISTORY/SUBJECTIVE:   -had increased pain--Dilaudid increased up to 2mg  Q4, gabapentin 300mg  added  -BP elevated up to 180's, hydralazine given, HTN resolved afterwards   -Anti-Xa Heparin Assay 0.04 and Fibrinogen 178 as of this AM    OBJECTIVE:   Vital Signs   Current  Minimum Maximum   BP BP: 170/74  BP: (111-187)/(48-110)    Temp Temp: 36.5 C (97.7 F)  Temp Min: 36 C (96.8 F)  Temp Max: 36.5 C (97.7 F)    Pulse Pulse: 65 Pulse Min: 63  Pulse Max: 84    Resp Resp: 12 Resp Min: 9  Resp Max: 30    O2 Sat SpO2: 97 % SpO2 Min: 89 % SpO2 Max: 100 %     Intake and Output: Last Two Completed Shifts:  I/O Last 2 Completed Shifts:  In: 3198.6 [Oral:350; Crystalloid:2848.6]  Out: 300 [Urine:300]    Intake/Output Summary (Last 24 hours) at 10/31/16 1248  Last data filed at 10/31/16 1100   Gross per 24 hour   Intake          3198.56 ml   Output             1050 ml   Net          2148.56 ml       PHYSICAL EXAM:  General: Alert, calm, no acute distress.   Heart: Normal rate and regular rhythm, no murmurs.   Lungs: Clear to auscultation.   Abdomen: Soft, non-tender, non-distended.    Extremities: Diminished sensation in distal toes with violaceous discoloration    VASCULAR PULSE EXAM   Carotid Rad Fem PT DP   RIGHT Present (no bruit) Present  Present   (no bruit)  Present Present    LEFT Present (no bruit) Present  Present (no bruit) Dopplerable No signal       LAB TESTS/STUDIES:       Recent labs for the past 72 hours     10/31/16 0336 10/29/16 1821    SODIUM 136 137    POTASSIUM 4.7 4.6    CHLORIDE 102 100    CARBON DIOXIDE TOTAL 28 27    UREA NITROGEN, BLOOD  (BUN) 13 12    CREATININE BLOOD 0.88 0.90    GLUCOSE 136* 88    CALCIUM 8.7 8.8    MAGNESIUM (MG) -- --    PHOSPHORUS (PO4) -- --    ALBUMIN -- --        Recent labs for the past 72 hours     10/31/16 1148 10/31/16 0141 10/30/16 1740 10/29/16 2125 10/29/16 1821    WHITE BLOOD CELL COUNT 7.6 10.5 6.6 7.1 8.4    HEMOGLOBIN 15.1 16.0 14.7 14.9 14.9    HEMATOCRIT 44.5 48.0 44.5 44.0 44.2    PLATELET COUNT 153 175 173 189 206        Recent labs for the past 72 hours     10/29/16 2125 10/29/16 1821    INR 1.02 1.03      Recent labs for the past 72 hours     10/29/16 1821    PROTHROMBIN  TIME --    APTT 28.5      POC Glucose, blood: --        ASSESSMENT & PLAN:    Kenneth Gordon is a [age]yr man presenting withsymptomatic occlusion of left popliteal artery stent as well as occluded distal SFA currently, now POD1, s/p angiogram and tPa infusion.    #Left Popliteal stent and SFA occlusion, no acute complications  -Intracatheter Heparin and tPA infusing  -Strict Bedrest  -Angiogram yesterdaywith tPA infusion thrombolysis (8/8)  -Returning to cath lab today for continued lysis with Dr. Wanda PlumpHumphries  -ASA 81 mg  -Plavix 75 mg  -Patient counseled on importance of not smoking, taking Bupropion    #HTN  -Lisinopril 20mg  daily    #Pain  -Tylenol 1000mg  Q6  -Gabapentin 300mg  TID  -Oxycodone 5-10mg   -Dilaudid 0.5-2mg     #Dispo  -Med/Surg floor    The above assessment and plan was discussed with and agreed upon by the Vascular Surgery Junior/Chief resident.    Kenneth RoysPhillip Kim, MD  PGY-1, Department of Surgery   PI# [phone]  Service Pager: [phone]    Vascular Surgery Attending Note    This patient was seen and evaluated. A care plan was developed with the resident team.  I have personally reviewed the available clinical information.    I agree with the assessment as outlined in the resident note.  The plan at this point is to take him back to the cath lab this afternoon for follow up. He may need angioplasty or a bypass procedure for long  term improved patency.     Kenneth FergusonMisty Allyce Bochicchio, MD  Division of Vascular Surgery  [facility]  [address], [address]  [place], [place] [id number]  Office Phone [phone]  Clinic: [phone]  Pager [phone]

## 2016-10-30 NOTE — Progress Notes (Addendum)
Surgical Critical Care Attending Daily Progress Note    I have seen and evaluated this patient on 10/31/2016.  I have reviewed the history and physical examination recorded by the [facility] Resident  including relevant laboratory and imaging studies. I have verified and discussed their findings and together we have developed the following assessment and plan.    Doing well, leg pain is much improved  Now with good pulse distally  Plan as per vascular    Report Electronically Signed By  Elmarie Mainlandhristine S Sache Sane, MD  Professor of Surgery  PI # [phone]    SURGICAL CRITICAL CARE  DAILY NOTE  Primary Team: Vascular  [facility]  10/30/2016  22:09    Reason for ICU admission:   Hep gtt and tPA gtt    Hospital Course:  LOS/POD 1  Patient admitted for thrombosed popliteal artery aneurysms    Pertinent Medical and Surgical Hx:  PMH: pvd   PSH: none    Injuries and Management:  Popliteal artery aneurysm with thrombosis    Procedures:   10/30/2016-Dr. Arita Missawson Balloon angioplasty of the popliteal artery. Mechanical thrombectomy lysis catheter placement.    Major 24 hour events:  Patient had uncontrolled LLU pain    NEURO  24hr events: difficulty w pain control  Exam: A&O x3  Pain: scheduled tylenol, gaba, oxy, dilaudid  A/P: Continue multimodal pain control, limiting narcotics as able.     CARDIOVASCULAR  24hr events: HTN, hydralazine 10mg  q4  Exam: RRR  BP: (!) 187/93 BP: (119-187)/(77-110)   Pulse: 72 Pulse Min: 68 Max: 84  Meds: hydralazine 10 mg q4  A/P: Continue hemodynamic monitoring.     PULMONARY  24hr events: No acute events  Exam: regular work of breathing on room air  Resp: 20 Resp Min: 14 Max: 28  SpO2: 94 % room air SpO2 Min: 93 % Max: 98 %  Vent:                           Last ABG:    Meds: none  A/P:Routine pulmonary hygiene, IS.      GI/NUTRITION  24 hours events: NPO for possible procedure  Exam: soft, no distention  Nutrition: Clear Liquid Diet   Enteral:    Last Bowel Movement: 10/28/16 (10/29/16 1633)   GI PPx:  Bowel  Care: colace, senna  A/P: Continue regular diet and routine bowel care to assist with constipation while on narcotics.    GU/RENAL/FLUIDS  24 hours events:None  Exam: urinating into urinal  Fluids:  IVF: Alteplase (ACTIVASE) 2.5 mg in NaCl 0.9% 250 mL Infusion, 0.75 mg/hr, INTRA-CATHETER, CONTINUOUS, Last Rate: 0.75 mg/hr (10/30/16 2036)  D5 / 0.45% NaCl w/ Potassium Chloride 20 mEq/L, 100 mL/hr, IV, CONTINUOUS, Last Rate: 100 mL/hr (10/30/16 1909)  Heparin, 200-500 Units/hr, INTRA-CATHETER, CONTINUOUS, Last Rate: 400 Units/hr (10/30/16 2020)      I/O Last 2 Completed Shifts:  In: 300 [Oral:300]  Out: 250 [Urine:250]  UOP: NR  Electrolytes:  Recent labs for the past 48 hours     10/29/16 1821    SODIUM 137    POTASSIUM 4.6    CHLORIDE 100    CARBON DIOXIDE TOTAL 27    UREA NITROGEN, BLOOD (BUN) 12    CREATININE BLOOD 0.90    CALCIUM 8.8    MAGNESIUM (MG) --    PHOSPHORUS (PO4) --      A/P:Continue to monitor UOP. Replace electrolytes PRN.    ENDOCRINE  POC Glucose, blood: --  Insulin:  None  Meds:  None  A/P: Continue to monitor for hyperglycemia, goal glucose <180.    HEMATOLOGY  Recent labs for the past 48 hours     10/30/16 1740 10/29/16 2125 10/29/16 1821    HEMOGLOBIN 14.7 14.9 14.9    HEMATOCRIT 44.5 44.0 44.2    PLATELET COUNT 173 189 206    APTT -- -- 28.5    INR -- 1.02 1.03      Recent labs for the past 48 hours     10/29/16 2125 10/29/16 1821    APTT -- 28.5    INR 1.02 1.03      DVT PPx:    A/P: Heparin & tPA    ID  Temp: 36.2 C (97.2 F)  Temp Min: 36 C (96.8 F) Max: 36.8 C (98.2 F)  Recent labs for the past 48 hours     10/30/16 1740 10/29/16 2125 10/29/16 1821    WHITE BLOOD CELL COUNT 6.6 7.1 8.4     Antibiotics: None    Culturelle: Not indicated  A/P:  Monitor for signs and symptoms of infection, none currently.     MUSCULOSKELETAL  Fractures: None  Exam: moving all extremities, LLE dusky, cool  Activity:Ad lib  PT/OT: Eval pending  A/P: per primary    Lines: PIV       Peripheral IV  over-the-needle catheter R wrist 20 gauge-IV Device Site Day: 1,       Peripheral IV 2 over-the-needle catheter L hand 22 gauge-IV Device Site Day: 0  Foley:No  Consults: Vascular patient  None  Complications: None apparent      Disposition: Continue ICU care   Code status: Full  Discharge Planning: Pending per primary team    Unknown Foley, MD  General Surgery  PGY-1 SICU Team B  Service Pager# [phone]

## 2016-10-30 NOTE — Plan of Care (Signed)
Problem: Patient Care Overview (Adult)  Goal: Plan of Care Review  Outcome: Ongoing (interventions implemented as appropriate)  Goal Outcome Evaluation Note    Kenneth Gordon is a [age]yr male admitted 10/29/2016    OUTCOME SUMMARY AND PLAN MOVING FORWARD:   VSS. Kept NPO in prep for scheduled angiogram in a.m. Heparin gtt infusing @ 1400 units/hr. Voiding well; [place] sent.        10/30/16 0402   OTHER   Plan Of Care Reviewed With patient   Plan of Care Review   Progress progress toward functional goals as expected     Goal: Individualization and Mutuality  Outcome: Ongoing (interventions implemented as appropriate)    Goal: Discharge Needs Assessment  Outcome: Ongoing (interventions implemented as appropriate)      Problem: Sleep Pattern Disturbance (Adult)  Goal: Adequate Sleep/Rest  Patient will demonstrate the desired outcomes by discharge/transition of care.   Outcome: Ongoing (interventions implemented as appropriate)   10/30/16 0402   Sleep Pattern Disturbance (Adult)   Adequate Sleep/Rest making progress toward outcome       Problem: Activity Intolerance (Adult)  Goal: Activity Tolerance  Patient will demonstrate the desired outcomes by discharge/transition of care.   Outcome: Ongoing (interventions implemented as appropriate)   10/30/16 0402   Activity Intolerance (Adult)   Activity Tolerance making progress toward outcome     Goal: Effective Energy Conservation Techniques  Patient will demonstrate the desired outcomes by discharge/transition of care.   Outcome: Ongoing (interventions implemented as appropriate)   10/30/16 0402   Activity Intolerance (Adult)   Effective Energy Conservation Techniques making progress toward outcome

## 2016-10-30 NOTE — Op Note (Addendum)
Operative Report  PATIENT: Kenneth Gordon  MR #: [id number]  DOB: [birth year]  SEX: male AGE:71yr     OPERATION DATE: 10/30/2016    PREOPERATIVE DIAGNOSIS: Occluded left popliteal stent, symptomatic  POSTOPERATIVE DIAGNOSIS: Same    PROCEDURE(S) PERFORMED:   1. Ultrasound guided access of the right common femoral artery  2. Aortography with bilateral iliac runoff  3. Left lower extremity arteriogram  4. Selective catheterization, first/second/third order, to the level of the left posterior tibial artery  5.  Balloon angioplasty of the popliteal artery (Armada 4 x 1 50 mm balloon catheter, Mustang 5 x 60 mm balloon catheter)  6.  Mechanical thrombectomy lysis catheter placement 4 French 20 cm infusion length Rolan Lipa catheter  6. Radiological supervision and interpretation    TYPE OF ANESTHESIA: Monitored conscious sedation     INDICATIONS: Kenneth Gordon 71 year old man s/p left popliteal artery aneurysm stenting (Viabahn 7 x 100 mm and 9 x 50  mm) on 05/06/2016 and was admitted to the hospital for planned left lower extremity angiography and thrombolysis after arterial duplex yesterday demonstrated occlusion of his graft and L SFA. Patient was seen in clinic yesterday for six-month follow-up of his popliteal artery aneurysm stent of the left leg. He reported that 2 weeks ago he developed left calf tenderness at rest with claudication as well as a tingling sensation in the distal aspect of his toes. Patient reported that the tenderness and claudication are no longer present but the tingling sensation is still there. Patient reports that he still smokes half a pack a day and is taking aspirin and Plavix.  Discussed with patient that if lysis and endovascular intervention is unsuccessful then he will likely need a bypass.    DESCRIPTION OF PROCEDURE:   Patient was brought to the cath lab and transferred to the table. A surgical pause was performed to  identify the correct patient, site and procedure. Monitored  conscious sedation was used during the procedure. Perioperative antibiotics were given within 60 minutes of the case start. The patient was prepped and draped in the normal sterile fashion.  Duplex ultrasound was used to visualize the right common femoral artery for access and noting the level of the bifurcation. Direct ultrasound guidance was used to access the right common femoral artery with micropuncture needle and wire using Seldinger technique and the wire placement was confirmed with fluoroscopy. Note: During this procedure all wires, catheters and sheaths were placed and adjusted under direct fluoroscopic guidance. A skin nick was made with a #11 blade scalpel. The access needle was removed and 5 Fr micro sheath was advanced over the wire.  A 0.035-inch Rosen wire was advanced into the abdominal aorta and the 5 Fr micro sheath was exchanged for a 5 Fr 10 cm sheath.  A flush imaging catheter was then advanced over the wire and aortography with bilateral iliac runoff was then performed. Note: A detail description of the angiographic findings are noted below in the "Findings" section. Selective catheterization of the left common iliac artery was then obtained and the wire and catheter were then advanced into the distal external iliac artery.  Angiography of the left lower extremity was then performed sequentially from the thigh to the foot.  The popliteal artery was thrombosed at the junction from the distal superficial femoral artery and reconstitution of the tibial peroneal trunk via collaterals.  Selective catheterization of the superficial femoral artery was then performed and the 5 French 10 cm sheath was then exchanged  for a 5 French 45 cm sheath.  At this point the patient was systemically heparinized with 4000 units of heparin given and ACT rechecked and heparin redosed to maintain therapeutic anticoagulation level during the case.  A 0.035 inch angled Glidewire was then used to cross the thrombosed  stent and it was noted that the stent had acute angle tortuosity present.  The wire was directed into the posterior tibial artery and the crossing catheter was attempted to cross into the posterior tibial artery but was unable.  The decision was made to lightly dilate the thrombosed tract through the popliteal stent and a Armada 4 x 150 mm balloon catheter was then sequentially dilated through the popliteal stent along the length.  The crossing catheter was then used to select the posterior tibial artery on the left.  The angled Glidewire was removed and a stiffer Amplatz wire was advanced through the catheter.  The crossing catheter was then removed and a 4 French 20 cm infusion length Rolan Liparaig McNamara catheter was then attempted to cross the stent along the length of thrombosis.  Difficulty was encountered with passing the catheter through the acute angle of the stent and different maneuvers were then undertaken to dilate with a 5 x 60 mm Mustang balloon catheter and placement of a 5 French by 65 cm sheath for longer support.  The infusion catheter was then able to be advanced through the majority of the popliteal stent.  Decision was then made at this point to set up the infusion catheter to run at 0.75 mg/h of TPA and 500 units an hour of heparin through the sheath.  The right groin access site was cleaned and a Biopatch was applied over the sheath.  The sheath was then sewn in place with a 2-0 nylon suture after a local field block was performed.  The infusion catheter was formed in his loop and the loop was sutured to the skin for protection of the catheter placement to the skin and a local field block performed prior to the suturing as well.  An OpSite dressing was placed over the sheath and catheter set up.  The patient tolerated the procedure well and was transferred to the ICU while the infusion catheter and sheath are in place.  Plan will be for the patient to return to the Cath Lab tomorrow after infusion  overnight and through the morning tomorrow and will reevaluate for further intervention or continued lysis.     FINDINGS:   1.  Mild tortuosity present in infrarenal abdominal aorta.  Patent bilateral renal arteries.  Patent bilateral common iliac arteries without stenosis.  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  Patent left external iliac artery and patent right external and internal iliac artery.  2.  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  Patent right common, deep and superficial femoral arteries visualized.  3.  Patent left common, deep and superficial femoral arteries without significant stenosis.  4.  Distal SFA with luminal irregularities and occlusion of the popliteal artery along the length of the stent and distally with reconstitution at the tibioperoneal trunk.  The tibioperoneal trunk fills via collaterals and patent flow to the foot via the peroneal and posterior tibial artery and collateral filling of the anterior tibial artery.  Patent flow in the left foot and filling of the plantar arch predominantly from the posterior tibial artery.  5.  Acute angle tortuosity of the left popliteal stent  after remodeling from the aneurysm exclusion.  No endoleak into the prior aneurysm sac.    ESTIMATED BLOOD LOSS (cc): 50  TOTAL CONTRAST USED (mL): 100 Visipaque  FLUORO TIME (min): 30.7  Dose Area Product (Gycm2): 209.2    COMPLICATIONS: None    NAME OF SURGEONS AND ASSISTANTS:   Attending Surgeon: Wynetta Fines, MD  *Attending Surgeon was present and participated in the entire case.    Assistant Surgeon: Carleene Overlie, MD      Carleene Overlie, MD, PI [phone]  Vascular Surgery, PGY-3  Personal Pager: [phone]  Vascular Surgery Service Pager: [phone]      I was physically present during the entire procedure(s) indicated herein.  Report electronically signed by Desiree Hane, MD.

## 2016-10-30 NOTE — Nurse Assessment (Signed)
[facility] RN Assessment Note     Bedside report conducted with day shift nurse, Gean Quinthristie Williams, and orders reviewed. Patient condition stable. Plan of Care reviewed and appropriate, discussed with patient. Initial assessment completed and recorded in EMR. Bed locked and low, alarms audible and appropriate, emergency equipment at bedside, and call light in reach.    Hospital Course       Date of Admission: 10/29/2016  Service: (A) Vascular Surgery    Ezzard FlaxValerie Berthold Glace, RN  10/30/2016, 23:54

## 2016-10-30 NOTE — Nurse Transfer Note (Addendum)
Cardiac Catheterization Lab  Nursing Hand-Off Report    Interventional MD:  Arita Missawson   Resident(s):  Dr Doylene CanningBolin    Procedure/Intervention Performed:   LLE angiogram, Mcnamara infusion catheter placement  Findings: See MD report in EMR    Pertinent History: See EMR    Total Medications:  Fentanyl:  200 mcg IVP  Versed:  4 mg IVP  Phenergan:  0 mg IVP  Heparin:  9,000 units IVP    Heparin gtt @ 500 units/hr started @ 1645 sheath side port  Alteplase infusion @ 0.75 mg/hr started @ 1645 Mcnamara infusion catheter    Intake:  Contrast Total: 100 mL Visipaque  IV Fluids: 500 mLNS     Output  Urine Output: 0 mL  Other Output: 0 mL  EBL: ~ 30 mL    Post-Procedure Pulses  RDP: 2  LDP: none  RPT: 2  LPT: none    Sheaths :     5 Fr RFA with Mcnamara Infusion catheter sutured in.    Last ACT  250 seconds at 1633    Vital Signs  HR: 70 bpm NBP: 130/75 mmHg RR: 16 /min SpO2: 98 % on RA    Procedural Complications: None    Report given to T7 blue . Patient transferred to T7 blue with RN, EKG, NBP, and SpO2 monitors.    NOTE:  MEDICATIONS (EITHER PO, TRANSDERMAL OR IV) MAY HAVE BEEN ORDERED TO BE DISCONTINUED IN THE CATH LAB. PLEASE CONSULT EMR: FOLLOW UP MAY BE REQUIRED TO RE-ESTABLISH MEDICATION     Wonda Cerisearris Jones, RN  Cardiac Catheterization Lab [phone]

## 2016-10-30 NOTE — Plan of Care (Signed)
Problem: Patient Care Overview (Adult)  Goal: Plan of Care Review  Outcome: Ongoing (interventions implemented as appropriate)  Goal Outcome Evaluation Note    Kenneth HamperRobert G Eke is a [age]yr male admitted 10/29/2016    OUTCOME SUMMARY AND PLAN MOVING FORWARD:   Pt in ICU for monitoring of LLE post revascularization attempt in cath lab. Sheath to R groin infusing TPA and heparin to LLE for popliteal artery occlusion. DP/PT pulse absent on LLE, palp/strong to RLE. Pt c/o moderate/severe pain to LLE. Pt hypertensive; likely due to pain. Plan for cath lab tomorrow for assessment. NPO after MN.     Problem: Sleep Pattern Disturbance (Adult)  Goal: Adequate Sleep/Rest  Patient will demonstrate the desired outcomes by discharge/transition of care.   Outcome: Ongoing (interventions implemented as appropriate)      Problem: Activity Intolerance (Adult)  Goal: Activity Tolerance  Patient will demonstrate the desired outcomes by discharge/transition of care.   Outcome: Ongoing (interventions implemented as appropriate)      Problem: Pressure Ulcer Risk (Braden Scale) (Adult,Obstetrics,Pediatric)  Goal: Skin Integrity  Patient will demonstrate the desired outcomes by discharge/transition of care.   Outcome: Ongoing (interventions implemented as appropriate)

## 2016-10-30 NOTE — Progress Notes (Signed)
POST OPERATIVE CHECK    Kenneth HamperRobert G Gordon is an [age]yr man with occluded L popliteal artery stent and L distal SFA s/p balloon angioplasty and mechanical thrombectomy of the L popliteal artery with Dr. Arita Missawson.    Patient having some increased pain in his foot-ordered Dilaudid for breakthrough pain.  No nausea.      BP 156/86  Pulse 74  Temp 36.6 C (97.8 F) (Oral)  Resp 16  Ht 1.88 m (6\' 2" )  Wt 77.5 kg (170 lb 13.7 oz)  SpO2 98%  BMI 21.94 kg/m2    Right groin with tPA and Heparin catheter covered with OpSite dressing. Slight oozing underneath the dressing which is to be expected.  Wound flat with no hematoma.      Doing well s/p balloon and mechanical thrombectomy of L popliteal artery. No acute complications  Pain control - Oxycodone and Dilaudid  Dispo - ICU floor  Clear liquid diet now, NPO at midnight  Strict bedrest    Broadus JohnScott Alvon Nygaard, MD  PGY1  PII: [phone], Pager: [email or web address]  Vascular Surgery Service Pager: [phone]

## 2016-10-30 NOTE — Nurse Assessment (Signed)
ASSESSMENT NOTE    Note Started: 10/30/2016, 07:33     Initial assessment completed and recorded in EMR.  Report received from night shift nurse and orders reviewed. Plan of Care reviewed and appropriate, discussed with patient. No signs of any distress, heparin drip at 1400 units infusing. NPO maintained.  Elmer Pickerowena Everette Mall, RN RN

## 2016-10-31 ENCOUNTER — Inpatient Hospital Stay (HOSPITAL_COMMUNITY): Payer: Medicare Other

## 2016-10-31 DIAGNOSIS — I724 Aneurysm of artery of lower extremity: Secondary | ICD-10-CM

## 2016-10-31 DIAGNOSIS — T82856A Stenosis of peripheral vascular stent, initial encounter: Secondary | ICD-10-CM

## 2016-10-31 DIAGNOSIS — I743 Embolism and thrombosis of arteries of the lower extremities: Secondary | ICD-10-CM

## 2016-10-31 DIAGNOSIS — I739 Peripheral vascular disease, unspecified: Secondary | ICD-10-CM

## 2016-10-31 DIAGNOSIS — I10 Essential (primary) hypertension: Secondary | ICD-10-CM

## 2016-10-31 HISTORY — PX: IR ANGIOPLASTY PERIPHERAL ARTERY: RADIR00912

## 2016-10-31 HISTORY — PX: IR LOWER EXTREMITY ARTERIOGRAPHY / INTERVENTION: RADIR00912

## 2016-10-31 LAB — CBC NO DIFFERENTIAL
HEMATOCRIT: 48 % (ref 41.0–53.0)
HEMATOCRIT: 44.5 % (ref 41.0–53.0)
HEMOGLOBIN: 16 g/dL (ref 13.5–17.5)
HEMOGLOBIN: 15.1 g/dL (ref 13.5–17.5)
MCH: 30.4 pg (ref 27.0–33.0)
MCH: 30.8 pg (ref 27.0–33.0)
MCHC: 33.3 % (ref 32.0–36.0)
MCHC: 33.9 % (ref 32.0–36.0)
MCV: 91.3 UM3 (ref 80.0–100.0)
MCV: 90.8 UM3 (ref 80.0–100.0)
MPV: 9 UM3 (ref 6.8–10.0)
MPV: 7.8 UM3 (ref 6.8–10.0)
PLATELET COUNT: 175 10*3/uL (ref 130–400)
PLATELET COUNT: 153 10*3/uL (ref 130–400)
RDW: 15.5 % — AB (ref 0.0–14.7)
RDW: 15.3 % — ABNORMAL HIGH (ref 0.0–14.7)
RED CELL COUNT: 5.26 10*6/uL (ref 4.50–5.90)
Red Blood Cell Count: 4.9 10*6/uL (ref 4.50–5.90)
WHITE BLOOD CELL COUNT: 10.5 10*3/uL (ref 4.5–11.0)
WHITE BLOOD CELL COUNT: 7.6 10*3/uL (ref 4.5–11.0)

## 2016-10-31 LAB — FIBRINOGEN
FIBRINOGEN: 176 mg/dL — AB (ref 179–395)
Fibrinogen: 178 mg/dL — ABNORMAL LOW (ref 179–395)
Fibrinogen: 190 mg/dL (ref 179–395)

## 2016-10-31 LAB — BASIC METABOLIC PANEL
CALCIUM: 8.7 mg/dL (ref 8.6–10.5)
CARBON DIOXIDE TOTAL: 28 mmol/L (ref 24–32)
CHLORIDE: 102 mmol/L (ref 95–110)
CREATININE BLOOD: 0.88 mg/dL (ref 0.44–1.27)
Glucose: 136 mg/dL — ABNORMAL HIGH (ref 70–99)
Potassium: 4.7 mmol/L (ref 3.3–5.0)
SODIUM: 136 mmol/L (ref 135–145)
UREA NITROGEN, BLOOD (BUN): 13 mg/dL (ref 8–22)

## 2016-10-31 LAB — ANTI-XA HEPARIN ASSAY
HEPARIN UFH: 0.04 U/mL
HEPARIN UFH: 0.04 U/mL
HEPARIN UFH: 0.05 U/mL

## 2016-10-31 MED ORDER — HEPARIN (PORCINE) 25,000 UNIT/250 ML (100 UNIT/ML) IN DEXTROSE 5 % IV
100.0000 [IU]/h | INTRAVENOUS | Status: DC
Start: 2016-10-31 — End: 2016-11-01

## 2016-10-31 MED ORDER — MIDAZOLAM (PF) 1 MG/ML INJECTION SOLUTION
INTRAMUSCULAR | Status: AC
Start: 2016-10-31 — End: 2016-10-31
  Filled 2016-10-31: qty 4

## 2016-10-31 MED ORDER — HEPARIN (PORCINE) (PF) 1,000 UNIT/500 ML IN 0.9 % SODIUM CHLORIDE IV
INTRAVENOUS | Status: AC
Start: 2016-10-31 — End: 2016-10-31
  Filled 2016-10-31: qty 1

## 2016-10-31 MED ORDER — FENTANYL (PF) 50 MCG/ML INJECTION SOLUTION
INTRAMUSCULAR | Status: AC
Start: 2016-10-31 — End: 2016-10-31
  Filled 2016-10-31: qty 4

## 2016-10-31 MED ORDER — HEPARIN (PORCINE) 25,000 UNIT/250 ML (100 UNIT/ML) IN DEXTROSE 5 % IV
INTRAVENOUS | Status: AC
Start: 2016-10-31 — End: 2016-10-31
  Filled 2016-10-31: qty 1

## 2016-10-31 MED ORDER — HEPARIN (PORCINE) 1,000 UNIT/ML INJECTION SOLUTION
INTRAMUSCULAR | Status: AC
Start: 2016-10-31 — End: 2016-10-31
  Filled 2016-10-31: qty 10

## 2016-10-31 MED ORDER — HYDROMORPHONE 1 MG/ML INJECTION SYRINGE
0.5000 mg | INJECTION | INTRAMUSCULAR | Status: DC | PRN
Start: 2016-10-31 — End: 2016-11-02

## 2016-10-31 MED ORDER — HEPARIN (PORCINE) (PF) 1,000 UNIT/500 ML IN 0.9 % SODIUM CHLORIDE IV
INTRAVENOUS | Status: AC
Start: 2016-10-31 — End: 2016-10-31
  Filled 2016-10-31: qty 2

## 2016-10-31 MED ORDER — HEPARIN (PORCINE) 1,000 UNIT/ML INJECTION SOLUTION
3.0000 mL/h | Status: DC
Start: 2016-10-31 — End: 2016-11-01
  Administered 2016-10-31: 3 mL/h
  Filled 2016-10-31: qty 1

## 2016-10-31 MED ORDER — NACL 0.9% IV BOLUS - DURATION REQ
250.0000 mL | INTRAVENOUS | Status: DC | PRN
Start: 2016-10-31 — End: 2016-11-02

## 2016-10-31 MED FILL — iodixanoL 320 mg iodine/mL intravenous solution: INTRAVENOUS | Qty: 100 | Status: AC

## 2016-10-31 NOTE — Progress Notes (Addendum)
VASCULAR SURGERY DAILY PROGRESS NOTE  16:23 10/31/2016   Vascular Attending: Alfredia FergusonMisty Ameria Sanjurjo, MD PI# [id number]  LOS: 2     ID: Kenneth HamperRobert G Gordon is a [age]yr old admitted for thrombolysis of occluded L popliteal stent    PROCEDURES (include date):  10/30/2016-Dr. Arita Missawson  Balloon angioplasty of the popliteal artery. Mechanical thrombectomy lysis catheter placement.    10/31/2016-Dr. Pevec  Balloon angioplasty of the left popliteal artery, stent grafting of the left popliteal artery, aspiration thromboembolectomy of the left tibioperoneal and  posterior tibial arteries. Mechanical thromboembolectomy of the left tibioperoneal and  posterior tibial arteries.Bilateral lower extremity arteriography.    INTERVAL HISTORY/SUBJECTIVE:   POD1 from cath lab with Dr. Larwance SachsPevec  NAE overnight  Pain well controlled  Sheath site stable O/N  Fibrinogen 163 and Anti-Xa Heparin Assay .28 this AM  Vitals WNL    OBJECTIVE:   Vital Signs   Current  Minimum Maximum   BP BP: 149/68  BP: (111-187)/(48-110)    Temp Temp: 36.6 C (97.9 F)  Temp Min: 36 C (96.8 F)  Temp Max: 36.6 C (97.9 F)    Pulse Pulse: 70 Pulse Min: 63  Pulse Max: 84    Resp Resp: 17 Resp Min: 9  Resp Max: 30    O2 Sat SpO2: 98 % SpO2 Min: 89 % SpO2 Max: 100 %     Intake and Output: Last Two Completed Shifts:  I/O Last 2 Completed Shifts:  In: 3198.6 [Oral:350; Crystalloid:2848.6]  Out: 300 [Urine:300]      Intake/Output Summary (Last 24 hours) at 11/01/16 1150  Last data filed at 11/01/16 0800   Gross per 24 hour   Intake          2114.73 ml   Output             1910 ml   Net           204.73 ml         PHYSICAL EXAM:  General: Alert, calm, no acute distress.   Heart: Normal rate and regular rhythm, no murmurs.   Lungs: Clear to auscultation.   Abdomen: Soft, non-tender, non-distended.    Extremities: Increased warmth, color and sensation from previous exam.     VASCULAR PULSE EXAM   Carotid Rad Fem PT DP   RIGHT Present (no bruit) Present  Present   (no bruit)  Present Present     LEFT Present (no bruit) Present  Present (no bruit) Doppler signal Doppler signal       LAB TESTS/STUDIES:     Recent labs for the past 72 hours     10/31/16 0336 10/29/16 1821    SODIUM 136 137    POTASSIUM 4.7 4.6    CHLORIDE 102 100    CARBON DIOXIDE TOTAL 28 27    UREA NITROGEN, BLOOD (BUN) 13 12    CREATININE BLOOD 0.88 0.90    GLUCOSE 136* 88    CALCIUM 8.7 8.8    MAGNESIUM (MG) -- --    PHOSPHORUS (PO4) -- --    ALBUMIN -- --        Recent labs for the past 72 hours     10/31/16 1148 10/31/16 0141 10/30/16 1740 10/29/16 2125 10/29/16 1821    WHITE BLOOD CELL COUNT 7.6 10.5 6.6 7.1 8.4    HEMOGLOBIN 15.1 16.0 14.7 14.9 14.9    HEMATOCRIT 44.5 48.0 44.5 44.0 44.2    PLATELET COUNT 153 175 173 189 206  Recent labs for the past 72 hours     10/29/16 2125 10/29/16 1821    INR 1.02 1.03      Recent labs for the past 72 hours     10/29/16 1821    PROTHROMBIN TIME --    APTT 28.5      POC Glucose, blood: --       ASSESSMENT & PLAN:    Kenneth Gordon is a 58yr man presenting withsymptomatic occlusion of left popliteal artery stent as well as occluded distal SFA currently. POD 2 s/p angiogram and tPa infusion, POD 1 thromboembolectomy and stenting.    #Left Popliteal stent and SFA occlusion, no acute complications  -IV Heparin and Access catheter sorenson solution  -Pull sheath this AM, restart Heparin 6 hours afterwards (3pm)  -Strict Bedrest  -Angiogramwith tPA infusion thrombolysis (8/8) followed by thromboembolectomy and stenting (8/9)  -ASA 81 mg  -Plavix 75 mg  -Patient counseled on importance of not smoking, taking Bupropion    #HTN  -Lisinopril 20mg  daily    #Pain  -Tylenol 1000mg  Q6  -Gabapentin 300mg  TID  -Oxycodone 5-10mg   -Dilaudid 0.5-2mg     #Dispo  -ICU    The above assessment and plan was discussed with and agreed upon by the Vascular Surgery Junior/Chief resident.    Kenneth John, MD  PGY1  PII: [phone], Pager: [email or web address]  Vascular Surgery Service Pager: [phone]    Vascular Surgery Attending  Note    This patient was seen and evaluated. A care plan was developed with the resident team.  I have personally reviewed the available clinical information.    I agree with the assessment as outlined in the resident note.  The plan at this point is to transition him to oral anticoagulation and once that is done he can go home. I want to see him back in 3 months with a duplex. At that time, I will likely set him up for a left femoral to popliteal bypass given the severe deformation of his stents and the likely for repeat thrombosis. If he undergoes bypass, he will not need long term anticoagulation after.     Kenneth Ferguson, MD  Division of Vascular Surgery  [facility]  [address], [address]  [place], [place] [address]  Office Phone [phone]  Clinic: [phone]  Pager [phone]

## 2016-10-31 NOTE — Nurse Focus (Signed)
Vascular MD, Izola PriceMyers notified that pt is reporting 7/10 pain in left leg and left foot and that pt has already had 10 mg of oxycodone without relief.  Order received for IV Dilaudid.  Will continue to monitor.  Dr. Izola PriceMyers also made aware that pt is refusing Foley catheter and is due to void.

## 2016-10-31 NOTE — Plan of Care (Signed)
Problem: Patient Care Overview (Adult)  Goal: Plan of Care Review  Outcome: Ongoing (interventions implemented as appropriate)  Goal Outcome Evaluation Note    Kenneth HamperRobert G Gordon is a [age]yr male admitted 10/29/2016    OUTCOME SUMMARY AND PLAN MOVING FORWARD:   VSS this shift. Intermittent elevated BP and hydral given PRN. Pt returned to cath lab this afternoon. New sheath in place that will stay in overnight. Pt NPO after midnight for possible intervention tomorrow. Per vascular: Keep heparin gtt at 1200units overnight (do not titrate to anti Xa). Sorenson to RFA sheath TKO. DP pulse +doppler. PT absent. Vascular aware.     Problem: Sleep Pattern Disturbance (Adult)  Goal: Adequate Sleep/Rest  Patient will demonstrate the desired outcomes by discharge/transition of care.   Outcome: Ongoing (interventions implemented as appropriate)      Problem: Activity Intolerance (Adult)  Goal: Activity Tolerance  Patient will demonstrate the desired outcomes by discharge/transition of care.   Outcome: Ongoing (interventions implemented as appropriate)      Problem: Pressure Ulcer Risk (Braden Scale) (Adult,Obstetrics,Pediatric)  Goal: Skin Integrity  Patient will demonstrate the desired outcomes by discharge/transition of care.   Outcome: Ongoing (interventions implemented as appropriate)

## 2016-10-31 NOTE — Nurse Focus (Signed)
[facility] Sheath to RFA stable with sorenson infusing. Heparin gtt infusing peripherally at 1200units/hr. VVS. Awating updated orders for meds.

## 2016-10-31 NOTE — Nurse Assessment (Signed)
[facility] RN Assessment Note     Bedside report conducted with night shift nurse Val RN and orders reviewed. Patient condition fair. Plan of Care reviewed and appropriate, discussed with patient. Initial assessment completed and recorded in EMR. Bed locked and low, alarms audible and appropriate, emergency equipment at bedside, and call light in reach. Coags remain subtherapeutic on redraw. SICU notified via text page to 5300. Heparin gtt maxed at 500units/hr and TPA at 0.75mg /hr. PT pulse audible on ultrasound. DP absent. Pt denies any pain in LLE and states the tingling on LLE foot has subsided.     Hospital Course       Date of Admission: 10/29/2016  Service: (A) Vascular Surgery    Claudette Headhristie Brigetta Beckstrom, RN  10/31/2016, 07:33

## 2016-10-31 NOTE — Nurse Assessment (Signed)
[facility] RN Assessment Note     Bedside report conducted with day shift nurse, Kenneth Gordon, and orders reviewed. Patient condition unchanged. Plan of Care reviewed and appropriate, discussed with patient. Initial assessment completed and recorded in EMR. Bed locked and low, alarms audible and appropriate, emergency equipment at bedside, and call light in reach.    Hospital Course       Date of Admission: 10/29/2016  Service: (A) Vascular Surgery    Kenneth FlaxValerie Brysten Reister, RN  10/31/2016, 23:37

## 2016-10-31 NOTE — Procedures (Signed)
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    PROCEDURE:    1.  Exchange of catheter during thrombolytic therapy.  2.  Catheterization of the left posterior tibial artery, first order  selective.  3.  Balloon angioplasty of the left popliteal artery.  4.  Stent grafting of the left popliteal artery.  5.  Aspiration thromboembolectomy of the left tibioperoneal and  posterior tibial arteries.  6.  Mechanical thromboembolectomy of the left tibioperoneal and  posterior tibial arteries.  7.  Bilateral lower extremity arteriography.  8.  Radiologic supervision and interpretation.  9.  Conscious sedation with continuous hemodynamic monitoring.    ANESTHESIA:    Local with sedation.    MEDICATION SUMMARY:    1) Fentanyl 200 mcg IV, 2) midazolam 4 mg IV, 3) heparin 6000 units  IV, 4) nitroglycerin 300 mcg intra-arterially.    TOTAL FLUOROSCOPY TIME:    17.6 minutes.    TOTAL FLUOROSCOPY DOSE:    34.3 Gycm2.  Total.    TOTAL CONTRAST:    Visipaque-320 110 ml.    INDICATIONS:    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  He presented for routine  clinical follow-up and on duplex scan was found to have occlusion of  the left popliteal artery stent grafts.  He noted an approximately two-  week history of left calf claudication, which had subsequently  resolved and he was left only with some tingling in the left lower  extremity.  Yesterday, he had a lysis catheter placed across the  occluded left popliteal artery.  Today, his fibrinogen has ranged from  170-190 mg/dL.  He has become very uncomfortable with lying supine in  the Intensive Care Unit.  It was felt that arteriography was  indicated  for re-assessment and intervention to salvage the left popliteal  artery reconstruction.    PROCEDURE:    Patient was placed in a supine position on the fluoroscopy table.  He  was continuously monitored with an automated blood pressure cuff, an  EKG tracing and pulse oximetry.  The patient's identity and correct  procedure were confirmed during a pre-procedure pause.  The lysis  catheter was removed and the right femoral sheath was prepared into  the field.    The patient was premedicated with fentanyl and midazolam.  He had  recently received a scheduled dose of cefazolin.  The skin of the  right groin was infiltrated with 1% lidocaine.  A 0.035 Amplatz Super  Stiff guidewire was inserted through the sheath, which was residing  with its tip in the left superficial femoral artery.  That right  femoral sheath was exchanged for a 6 French x 70 cm Raabe sheath, the  distal tip of which was placed in the distal left superficial femoral  artery.  The superficial drapes were discarded and the surgeon and  assistant changed gloves.    An arteriogram was obtained via the sheath.  This showed the stent  graft in the left popliteal artery to be patent, with some non-  occluding residual thrombus.  Left lower extremity run-off images were  obtained which showed all three infrapopliteal arteries to be patent.    There was a stenosis associated with a kink in the stent graft.  The  activated clotting time was checked and was found to be greater than  300 seconds.  The patient was empirically given 3000 units of heparin  intravenously.  An additional dose of 3000 units of heparin was  administered later in the case to maintain an activated clotting time  of greater than 250 seconds.    A 0.035 angled Terumo Glidewire and a 4 French angled Glide catheter  were negotiated into the left tibioperoneal artery and then the wire  was exchanged for a 0.035 Rosen wire.    A 6 x 60 mm balloon was inflated across the stent graft  in the left  popliteal artery.  This showed improvement, but a residual stenosis.  A 7 x 40 mm balloon was then inflated in the distal left popliteal  artery stent graft.  This showed improvement in the stenosis, but a  new stenosis in the more distal stent graft.  The etiology of that  more distal stenosis was not clear.  It was an eccentric, lenticular  defect not consistent with an embolus, but perhaps some residual  thrombus which had shifted during the balloon angioplasty.  It was  decided to treat that lesion as well as a residual stenosis with an  additional covered stent.    The sheath was exchanged over the [facility] wire for a 7 French x 70 cm  Raabe sheath, the tip of which was positioned again in the distal left  superficial femoral artery.  The Rosen wire was exchanged through a 4  French angled Glide catheter for a 0.018 Steel Core wire, which was  positioned in the left tibioperoneal artery.  A 7 x 100 mm Viabahn  stent graft was then deployed, overlapping the previous stent grafts  and extending an additional 2.5 cm into the infrageniculate left  popliteal artery.  The new Viabahn stent graft was then post dilated  with a 6 x 100 mm balloon.    Repeat arteriography showed good a result in the popliteal stent graft  with no residual stenosis, although there was still residual thrombus  in the proximal portion.  However, there was a non-occluding embolus  in the left tibial peroneal artery, and occluding thrombus in the left  posterior tibial artery.    The 4 French angled Glidecath was reinserted, and the wire was  exchanged for a 0.014 PT Graphix guidewire.  The wire was negotiated  into the left posterior tibial artery.  A 6 [place] export catheter was  then advanced over the wire and used to aspirate the tibioperoneal and  proximal posterior tibial arteries, without significant improvement.  A 4 French Angiojet catheter was then advanced over the wire and  passed antegrade and then retrograde across the  left tibioperoneal and  posterior tibial arteries.  Completion arteriogram showed a  persistent, non-occluding thrombus in the tibioperoneal artery, and  persistent occlusion of the mid posterior tibial artery, despite an  infusion of intra-arterial nitroglycerin.    The decision was made to discontinue further attempts at thrombus  retrieval.  The plan will be to maintain intravenous heparin and hope  that the flowing blood and the residual effect of the tPA will clear  the thromboemboli from the tibioperoneal and posterior tibial  arteries.    The 0.014 wire was exchanged for a 0.035 J-tip guidewire and the  sheath was exchanged for a 7 French x 10 cm sheath.  A right femoral  arteriogram was obtained.    FINDINGS:    1.  The initial images showed that the left above-the-knee popliteal  artery was diffusely irregular with atherosclerotic plaque.  The stent  grafts in the left popliteal artery were patent, with some residual  non-occluding thrombus in the proximal stent graft, and a 70% stenosis  at the level of a kink in the stent graft at the level of the femoral  condyles.  The infrageniculate popliteal artery had a 30% stenosis at  the distal end of the stent graft.  The remainder of the distal  popliteal artery was widely patent.  2.  The initial images showed the anterior tibial artery to be patent  with segmental, 50-70% stenoses in the mid artery.  The left  tibioperoneal and peroneal arteries were widely patent.  The left  posterior tibial artery was patent with a focal, smooth 30% stenosis  at the level of the distal tibia.  3.  After insertion of the Viabahn stent graft, the stented left  popliteal artery is widely patent.  There is some residual non-  occluding thrombus in the proximal stent graft, but the mid and distal  stent graft is smooth and widely patent.  The distal left popliteal  artery is widely patent.  The left anterior tibial artery remains  patent with the above mentioned diffuse  stenosis, without change.  The  left anterior tibial artery continues into the foot as a dorsal pedal  artery.  There is a non-occluding but significant filling defect,  consistent with a thromboembolus, that persists in the left  tibioperoneal artery.  The left peroneal artery remains widely patent  to the ankle.  The proximal third of the left posterior tibial artery  is patent.  The middle third of the left posterior tibial artery is  occluded.  The distal left posterior tibial artery reconstitutes at  the ankle, although it does not appear to have patent branches beyond  the calcaneus.    SUMMARY FINDINGS:    1.  Residual stenosis in the previously stent grafted left popliteal  artery, successfully treated with balloon angioplasty and insertion of  an additional a covered stent.  2.  Thromboembolus to the left tibioperoneal and posterior tibial  arteries.  At the end the procedure, the left tibioperoneal artery has  a non-occluding filling defect, and the mid left posterior tibial  artery is occluded.  The anterior tibial and peroneal arteries remain  patent.    NAME OF SURGEONS AND ASSISTANTS:    Surgeon:  Bo Merino, MD      Report Electronically Signed - 10/31/2016 21:00:55 by  Bo Merino, MD  Professor Of Surgery  Surgery  Vascular Surgery    WCP/kc  D:  10/31/2016 18:57:09 PDT/PST  T:  10/31/2016 19:27:02 PDT/PST  Job #:  [id number] / [id number]

## 2016-10-31 NOTE — Nurse Focus (Signed)
SICU MD, Kleber, notified that pt's q 6 hour Anti-Xa and fibrinogen will be late due to lab draw being hemolyzed.

## 2016-10-31 NOTE — Nurse Focus (Signed)
Vascular MD, Izola PriceMyers, at bedside.  Dr Izola PriceMyers made aware that pt continues to report severe pain in left leg and left foot, and that pt is hypertensive with SBP in the 170's-180's.  Orders received to increase Dilaudid dosage prn and to administer Neurontin.  PRN orders also received for hydralazine IV.  Dr Izola PriceMyers also made aware that area around right femoral sheath feels more firm, though no bruising noted.  Dr Izola PriceMyers assessed femoral sheath and states this appears normal and that there are no signs of hematoma.  No changes noted to neurovascular assessment to BLE's. Will continue to monitor.

## 2016-10-31 NOTE — Progress Notes (Signed)
No pain or numbness in left foot.    Stable right groin hematoma.  Left foot is pink and warm.  No palpable left DP or PT pulses.  Biphasic left DP Doppler signal; no left PT Doppler signal.    Continue heparin.

## 2016-10-31 NOTE — Nurse Focus (Signed)
SICU MD Kleber, notified that per [phone] lab draw, Anti-Xa dropped from 0.53 - drawn at 1910 yesterday - to 0.04.  Per heparin gtt to right femoral sheath orders, maximum dose of heparin is 500 units per hour and was increased to that.  MD also made aware that fibrinogen dropped from 440 - drawn at 1740 yesterday - to 176.  Alteplase infusion to right femoral sheath remains at ordered amount of 0.75 mg/hr.  Per Dr. Armstead PeaksKleber, these are the vascular team's gtts and should remain running at the ordered amount.

## 2016-10-31 NOTE — Progress Notes (Addendum)
Surgical Critical Care Attending Daily Progress Note    I have seen and evaluated this patient on 11/01/2016.  I have reviewed the history and physical examination recorded by the [facility] Resident  including relevant laboratory and imaging studies. I have verified and discussed their findings and together we have developed the following assessment and plan.    Doing well  Leg is feeling much better      Report Electronically Signed By  Elmarie Mainland, MD  Professor of Surgery  PI # [id number]    SURGICAL CRITICAL CARE  DAILY NOTE  Primary Team: Vascular  ICU Team: [facility]  11/01/2016  08:44    Reason for ICU admission:   Hep gtt and tPA gtt    Hospital Course:  LOS/POD 3  Patient admitted for thrombosed popliteal artery aneurysms    Pertinent Medical and Surgical Hx:  PMH: pvd   PSH: none    Injuries and Management:  Popliteal artery aneurysm with thrombosis    Procedures:   10/30/2016-Dr. [name] Balloon angioplasty of the popliteal artery. Mechanical thrombectomy lysis catheter placement.  10/31/16 - (Dr. Larwance Sachs)  Balloon angioplasty of stent grafted left popliteal artery. Stent grafting of distal left popliteal artery. Aspiration and mechanical thrombectomy of left tibioperoneal and posterior tibial arteries    Major 24 hour events:  Continue heparin gtt and heparin infusion through the sheath  TPA was discontinued vascular   Doppler DP signal, no PT signal     NEURO  24hr events: no acute event overnight   Exam: A&O x3  Pain: scheduled tylenol, gaba, oxy, dilaudid  A/P: Continue multimodal pain control, limiting narcotics as able.     CARDIOVASCULAR  24hr events: HTN, hydralazine 10mg  q4  Exam: RRR  BP: 160/71 BP: (134-182)/(68-93)   Pulse: 94 Pulse Min: 65 Max: 94  Meds: hydralazine 10 mg q4  A/P:   #Left Popliteal stent and SFA occlusion, no acute complications  -S/p continued catheter lysis of thrombosis   -Intracatheter Heparin infusing and heparin gtt  -Strict Bedrest  -ASA 81 mg  -Plavix 75 mg  -Continue vascular  check to LLE with doppler for DP and PT     PULMONARY  24hr events: No acute events  Exam: regular work of breathing on room air  Resp: 19 Resp Min: 12 Max: 25  SpO2: 97 % room air SpO2 Min: 94 % Max: 98 %  Meds: none  A/P:Routine pulmonary hygiene, IS.      GI/NUTRITION  24 hours events: regular diet  Exam: soft, no distention  Nutrition: Regular Diet   Enteral:    Last Bowel Movement: 10/28/16 (10/29/16 1633)   GI PPx:  Bowel Care: colace, senna  A/P: Continue regular diet and routine bowel care to assist with constipation while on narcotics.    GU/RENAL/FLUIDS  24 hours events:None  Exam: urinating into urinal  Fluids:  IVF:    I/O Last 2 Completed Shifts:  In: 2856.7 [Oral:400; Crystalloid:2456.7]  Out: 2660 [Urine:2660]  UOP: NR  Electrolytes:  Recent labs for the past 48 hours     11/01/16 0442 10/31/16 0336    SODIUM 136 136    POTASSIUM 4.1 4.7    CHLORIDE 103 102    CARBON DIOXIDE TOTAL 25 28    UREA NITROGEN, BLOOD (BUN) 13 13    CREATININE BLOOD 0.86 0.88    CALCIUM 8.6 8.7    MAGNESIUM (MG) -- --    PHOSPHORUS (PO4) -- --  A/P:Continue to monitor UOP. Replace electrolytes PRN.    ENDOCRINE  POC Glucose, blood: --  Insulin:  None  Meds:  None  A/P: Continue to monitor for hyperglycemia, goal glucose <180.    HEMATOLOGY  Recent labs for the past 48 hours     11/01/16 0442 10/31/16 1148 10/31/16 0141    HEMOGLOBIN 14.2 15.1 16.0    HEMATOCRIT 42.0 44.5 48.0    PLATELET COUNT 138 153 175    APTT -- -- --    INR -- -- --        No results found for this basename: APTT:3,INR:3 in the last 48 hours   DVT PPx:    A/P: Continue heparin at 1200 units, and check anti-Xa in the am    ID  Temp: 36.3 C (97.3 F)  Temp Min: 36.1 C (97 F) Max: 36.6 C (97.9 F)  Recent labs for the past 48 hours     11/01/16 0442 10/31/16 1148 10/31/16 0141    WHITE BLOOD CELL COUNT 9.7 7.6 10.5     Antibiotics: None    Culturelle: Not indicated  A/P:  Monitor for signs and symptoms of infection, none currently.      MUSCULOSKELETAL  Fractures: None  Exam: moving all extremities, LLE dusky, cool  Activity:Ad lib  PT/OT: Eval pending  A/P: per primary    Lines: PIV       Peripheral IV over-the-needle catheter R wrist 20 gauge-IV Device Site Day: 3,       Peripheral IV 2 over-the-needle catheter L hand 22 gauge-IV Device Site Day: 2, [REMOVED]       Sheath 10/30/16  right;femoral-IV Device Site Day: 1,       Sheath 10/31/16 1752 right;femoral-IV Device Site Day: 0  Foley:No  Consults: Vascular patient  None  Complications: None apparent      Disposition: per primary team  Code status: Full  Discharge Planning: Pending per primary team    Kenneth BrodYunfeng Jay Xue, MD  PGY2, Surgery  Personal pager: [phone]  SICU B: [id number]

## 2016-10-31 NOTE — Nurse Transfer Note (Signed)
Cardiac Catheterization Lab  Nursing Hand-Off Report    Interventional Cardiologist: Pevec    Fellow(s):  n/a     Procedure/Intervention Performed:   Removal infusion catheter, POBA popliteal artery graft., stent x1 to Left popliteal artery   Findings: See MD note.    Pertinent History: See EMR.    Total Medications:  Fentanyl:  200mcg IVP  Versed:  4mg  IVP  Heparin:  6000units IVP    Intake:  Contrast Total: 110mL Visipaque  IV Fluids: 400mLNS     Output  Urine Output: 0mL  EBL: minimal    Post-Procedure Pulses  RDP: doppler  LDP: absent  RPT: doppler  LPT: absent    Sheaths :  187fr. RFA-- sutured in    Last ACT:  >400 seconds at 17:35    Vital Signs  HR: 80bpm NBP: 145/5077mmHg RR: 18/min SpO2: 96% on RA    Procedural Complications: None    [facility] Patient transferred to [facility] with RN, EKG, NBP, and SpO2 monitors.    NOTE:  MEDICATIONS (EITHER PO, TRANSDERMAL OR IV) MAY HAVE BEEN ORDERED TO BE DISCONTINUED IN THE CATH LAB. PLEASE CONSULT EMR: FOLLOW UP MAY BE REQUIRED TO RE-ESTABLISH MEDICATION     Kyra MangesNathaniel Scott-Orman, RN  Cardiac Catheterization Lab [phone]

## 2016-10-31 NOTE — Nurse Focus (Signed)
SICU MD, Kleber, notified that pt continues to report 8-9/10 pain in left leg and left foot.  Order received to increase Dilaudid.

## 2016-10-31 NOTE — Procedures (Signed)
Procedure: 1. Bilateral lower extremity arteriography    2. Balloon angioplasty of stent grafted left popliteal artery    3. Stent grafting of distal left popliteal artery.    4. Aspiration and mechanical thrombectomy of left tibioperoneal and posterior tibial arteries.    Surgeon: Larwance SachsPevec    Anesthesia: Local with sedation    Findings: Some residual thrombus in left popliteal stent graft, with stenosis at a bend in the stent graft; successfully revised.    Embolus to left tibioperoneal and posterior tibial arteries; did not resolve.  Runoff via anterior tibial and peroneal arteries.    Plan to maintain IV heparin infusion over night; keep sheath in place.    I participated in the entire operation.

## 2016-10-31 NOTE — Progress Notes (Signed)
History and images reviewed.    The left foot feels better.    The left foot is pink and warm.  Moderate right groin hematoma.    [name] is agreeable for me to do is procedure today.

## 2016-10-31 NOTE — Progress Notes (Addendum)
POST OPERATIVE CHECK    Kenneth Gordon is an [age]yr man s/p occluded L popliteal artery stent and L distal SFA s/p balloon angioplasty of the left popliteal artery with stent grafting of the left popliteal artery.    Doing well postoperatively.  Pain well controlled.  No nausea.  Doppler signals in L DP, not in PT    BP 170/74  Pulse 65  Temp 36.5 C (97.7 F) (Temporal)  Resp 12  Ht 1.88 m (6\' 2" )  Wt 78.2 kg (172 lb 6.4 oz)  SpO2 97%  BMI 22.13 kg/m2    Wound covered, dressing dry with no saturation.  Wound flat with no hematoma.    Doing well No acute complications  Pain well controlled - Tylenol, oxycodone, Dilaudid   Dispo - To MICU  Advance diet as tolerated  Bedrest     Kenneth JohnScott Amaiyah Nordhoff, MD  PGY1  PII: [phone], Pager: [phone]  Vascular Surgery Service Pager: [phone]

## 2016-11-01 ENCOUNTER — Other Ambulatory Visit: Payer: Self-pay

## 2016-11-01 ENCOUNTER — Inpatient Hospital Stay (HOSPITAL_COMMUNITY): Payer: Medicare Other

## 2016-11-01 DIAGNOSIS — I739 Peripheral vascular disease, unspecified: Secondary | ICD-10-CM

## 2016-11-01 LAB — BASIC METABOLIC PANEL
CALCIUM: 8.6 mg/dL (ref 8.6–10.5)
CARBON DIOXIDE TOTAL: 25 mmol/L (ref 24–32)
CHLORIDE: 103 mmol/L (ref 95–110)
CREATININE BLOOD: 0.86 mg/dL (ref 0.44–1.27)
GLUCOSE: 92 mg/dL (ref 70–99)
POTASSIUM: 4.1 mmol/L (ref 3.3–5.0)
SODIUM: 136 mmol/L (ref 135–145)
UREA NITROGEN, BLOOD (BUN): 13 mg/dL (ref 8–22)

## 2016-11-01 LAB — CBC NO DIFFERENTIAL
HEMATOCRIT: 42 % (ref 41.0–53.0)
HEMATOCRIT: 43.9 % (ref 41.0–53.0)
HEMOGLOBIN: 14.2 g/dL (ref 13.5–17.5)
HEMOGLOBIN: 14.6 g/dL (ref 13.5–17.5)
MCH: 30.8 pg (ref 27.0–33.0)
MCH: 29.9 pg (ref 27.0–33.0)
MCHC: 33.9 % (ref 32.0–36.0)
MCHC: 33.2 % (ref 32.0–36.0)
MCV: 90.9 UM3 (ref 80.0–100.0)
MCV: 90.2 UM3 (ref 80.0–100.0)
MPV: 7.9 UM3 (ref 6.8–10.0)
MPV: 8 UM3 (ref 6.8–10.0)
PLATELET COUNT: 138 10*3/uL (ref 130–400)
PLATELET COUNT: 149 10*3/uL (ref 130–400)
RDW: 15 % — AB (ref 0.0–14.7)
RDW: 15.5 % — AB (ref 0.0–14.7)
RED CELL COUNT: 4.62 10*6/uL (ref 4.50–5.90)
RED CELL COUNT: 4.87 10*6/uL (ref 4.50–5.90)
WHITE BLOOD CELL COUNT: 9.7 10*3/uL (ref 4.5–11.0)
WHITE BLOOD CELL COUNT: 10.7 10*3/uL (ref 4.5–11.0)

## 2016-11-01 LAB — BASELINE APTT: aPTT: 26.5 s (ref 24.1–36.7)

## 2016-11-01 LAB — ANTI-XA HEPARIN ASSAY
HEPARIN UFH: 0.28 U/mL
HEPARIN UFH: 0.01 U/mL
HEPARIN UFH: 0.32 U/mL

## 2016-11-01 LAB — BASELINE ANTI-XA HEPARIN ASSAY: HEPARIN UFH: 0.05 U/mL

## 2016-11-01 LAB — FIBRINOGEN: FIBRINOGEN: 263 mg/dL (ref 179–395)

## 2016-11-01 LAB — INR: INR: 1.07 (ref 0.87–1.18)

## 2016-11-01 MED ORDER — RIVAROXABAN 15 MG TABLET
15.0000 mg | ORAL_TABLET | Freq: Two times a day (BID) | ORAL | 0 refills | Status: DC
Start: 2016-11-01 — End: 2016-11-01
  Filled 2016-11-01: qty 42, 21d supply, fill #0

## 2016-11-01 MED ORDER — RIVAROXABAN 20 MG TABLET
20.0000 mg | ORAL_TABLET | Freq: Every day | ORAL | Status: DC
Start: 2016-11-01 — End: 2016-11-01

## 2016-11-01 MED ORDER — HEPARIN (PORCINE) 25,000 UNIT/250 ML (100 UNIT/ML) IN DEXTROSE 5 % IV
100.0000 [IU]/h | INTRAVENOUS | Status: DC
Start: 2016-11-01 — End: 2016-11-02
  Administered 2016-11-01 – 2016-11-02 (×2): 1400 [IU]/h via INTRAVENOUS
  Filled 2016-11-01 (×2): qty 1

## 2016-11-01 MED ORDER — ATROPINE 0.1 MG/ML INJECTION SYRINGE
0.5000 mg | INJECTION | INTRAMUSCULAR | Status: DC | PRN
Start: 2016-11-01 — End: 2016-11-02
  Filled 2016-11-01: qty 10

## 2016-11-01 MED ORDER — HYDRALAZINE 20 MG/ML INJECTION SOLUTION
10.0000 mg | INTRAMUSCULAR | Status: DC | PRN
Start: 2016-11-01 — End: 2016-11-02

## 2016-11-01 MED ORDER — RIVAROXABAN 15 MG (42)-20 MG (9) TABLETS IN A STARTER PACK
ORAL_TABLET | ORAL | 0 refills | Status: DC
Start: 2016-11-01 — End: 2016-11-20
  Filled 2016-11-01: qty 51, 30d supply, fill #0

## 2016-11-01 MED ORDER — HEPARIN IV BOLUS FROM INFUSION BAG
2500.0000 [IU] | INTRAVENOUS | Status: DC | PRN
Start: 2016-11-01 — End: 2016-11-02

## 2016-11-01 MED ORDER — RIVAROXABAN 20 MG TABLET
20.0000 mg | ORAL_TABLET | Freq: Every day | ORAL | 11 refills | Status: DC
Start: 2016-11-01 — End: 2016-11-20
  Filled 2016-11-01: qty 30, 30d supply, fill #0

## 2016-11-01 MED ORDER — RIVAROXABAN 15 MG TABLET
15.0000 mg | ORAL_TABLET | Freq: Two times a day (BID) | ORAL | Status: DC
Start: 2016-11-01 — End: 2016-11-01

## 2016-11-01 MED ORDER — APIXABAN 5 MG TABLET
5.0000 mg | ORAL_TABLET | Freq: Two times a day (BID) | ORAL | 0 refills | Status: DC
Start: 2016-11-01 — End: 2016-11-01
  Filled 2016-11-01: qty 60, 30d supply, fill #0

## 2016-11-01 MED FILL — iodixanoL 320 mg iodine/mL intravenous solution: INTRAVENOUS | Qty: 110 | Status: AC

## 2016-11-01 NOTE — Plan of Care (Signed)
Problem: Patient Care Overview (Adult)  Goal: Plan of Care Review  Outcome: Ongoing (interventions implemented as appropriate)   11/01/16 1852   OTHER   Plan Of Care Reviewed With patient   Plan of Care Review   Progress progress toward functional goals as expected     Goal Outcome Evaluation Note    Kenneth Gordon is a 89yr male admitted 10/29/2016    OUTCOME SUMMARY AND PLAN MOVING FORWARD:   Patient alert and oriented. Ambulated in hall to new bed with 2 RN. Heparin drip infusing. Dinner ordered. Pulses palpable and doppler for both DP's. Patient resting between care.    Goal: Individualization and Mutuality  Outcome: Ongoing (interventions implemented as appropriate)    Goal: Discharge Needs Assessment  Outcome: Ongoing (interventions implemented as appropriate)      Problem: Sleep Pattern Disturbance (Adult)  Goal: Adequate Sleep/Rest  Patient will demonstrate the desired outcomes by discharge/transition of care.   Outcome: Ongoing (interventions implemented as appropriate)   11/01/16 1852   Sleep Pattern Disturbance (Adult)   Adequate Sleep/Rest making progress toward outcome       Problem: Activity Intolerance (Adult)  Goal: Activity Tolerance  Patient will demonstrate the desired outcomes by discharge/transition of care.   Outcome: Ongoing (interventions implemented as appropriate)   11/01/16 1852   Activity Intolerance (Adult)   Activity Tolerance making progress toward outcome     Goal: Effective Energy Conservation Techniques  Patient will demonstrate the desired outcomes by discharge/transition of care.   Outcome: Ongoing (interventions implemented as appropriate)   11/01/16 1852   Activity Intolerance (Adult)   Effective Energy Conservation Techniques making progress toward outcome       Problem: Pressure Ulcer Risk (Braden Scale) (Adult,Obstetrics,Pediatric)  Goal: Skin Integrity  Patient will demonstrate the desired outcomes by discharge/transition of care.   Outcome: Ongoing (interventions  implemented as appropriate)   11/01/16 1852   Pressure Ulcer Risk (Braden Scale) (Adult,Obstetrics,Pediatric)   Skin Integrity making progress toward outcome

## 2016-11-01 NOTE — Nurse Assessment (Signed)
ASSESSMENT NOTE    Note Started: 11/01/2016, 18:41     Initial assessment completed.  Report received from day shift nurse and orders reviewed. Plan of Care reviewed and appropriate, discussed with patient.  Venda RodesMorgan Ramiel Forti, RN

## 2016-11-01 NOTE — Discharge Summary (Addendum)
DEPARTMENT OF VASCULAR SURGERY  HOSPITAL DISCHARGE SUMMARY    Note Started: 11/01/2016, 13:05  Admission Date: 10/29/2016  4:32 PM  Discharge Date: 11/01/2016   Admission Diagnosis: Left popliteal artery occlusion [I74.3]  Discharge Diagnosis: same    PAST MEDICAL HISTORY:  Past Medical History:   Diagnosis Date    Allergic rhinitis     COPD (chronic obstructive pulmonary disease)     Essential hypertension     NO SIGNIFICANT HISTORY (aka NONE)     Psoriasis     Tobacco dependence        OPERATIONS / PROCEDURES:  10/30/2016-Dr. Arita Missawson  Balloon angioplasty of the popliteal artery. Mechanical thrombectomy lysis catheter placement.    10/31/2016-Dr. Pevec  Balloon angioplasty of the left popliteal artery, stent grafting of the left popliteal artery, aspiration thromboembolectomy of the left tibioperoneal and posterior tibial arteries. Mechanical thromboembolectomy of the left tibioperoneal and posterior tibial arteries.Bilateral lower extremity arteriography.    CONSULTATIONS:  None    BRIEF HISTORY OF PRESENT ILLNESS:  Kenneth Gordon is a [age]yr male with hx of of L popliteal artery stenting and L distal SFA s/p balloon angioplasty and mechanical thrombectomy of a L popliteal artery aneurysm. Presented to vascular clinic with left leg claudication and ultrasound evidence of occluded stent.      HOSPITAL COURSE/PROBLEM LIST AND MANAGEMENT:   [facility] planned angiography and intervention in the evening on 10/29/16. The following morning patient was taken to the cath lab and had balloon angioplasty of the popliteal artery with mechanical thrombectomy lysis catheter placement. Patient then went to ICU with continued tPA infusion into the remaining clot burden of his left leg along with heparin infusing into the R groin access site. Patient returned to cath lab for balloon angioplasty of the left popliteal artery, stent grafting of the left popliteal artery, aspiration  thromboembolectomy of the left tibioperoneal and posterior tibial arteries. He also had mechanical thromboembolectomy of the left tibioperoneal andposterior tibial arteries. He returned to the ICU and continued to receive heparin infusion through access site. The following morning patient had R groin sheath pulled. Patient had some slight bleeding at his access site- Duplex of R groin did not demonstrate pseudoaneurysm. Patient stopped heparin and is being discharged today on Xarelto for 1 month. Patient will call [employer] to lobby for insurance coverage.    LABS:  Lab Results   Lab Name Value Date/Time    NA 137 11/02/2016 04:41 AM    NA 139 12/30/2014 07:14 AM    K 4.1 11/02/2016 04:41 AM    K 4.3 12/30/2014 07:14 AM    CL 104 11/02/2016 04:41 AM    CL 100 12/30/2014 07:14 AM    CO2 26 11/02/2016 04:41 AM    CO2 30 12/30/2014 07:14 AM    BUN 14 11/02/2016 04:41 AM    BUN 17 12/30/2014 07:14 AM    CR 0.92 11/02/2016 04:41 AM    CR 0.97 12/30/2014 07:14 AM    GLU 98 11/02/2016 04:41 AM    GLU 89 12/30/2014 07:14 AM     Lab Results   Lab Name Value Date/Time    WBC 9.7 11/02/2016 04:41 AM    WBC 8.2 12/30/2014 07:14 AM    HGB 14.1 11/02/2016 04:41 AM    HGB 16.5 12/30/2014 07:14 AM    HCT 42.6 11/02/2016 04:41 AM    HCT 49.6 12/30/2014 07:14 AM    PLT 157 11/02/2016 04:41 AM  PLT 197 12/30/2014 07:14 AM       RADIOLOGY:  VL VEIN MAPPING LOWER EXTREMITY, BILATERAL  VL VEIN MAPPING UPPER EXTREMITY, BILATERAL  VL ARTERIAL DUPLEX LOWER EXTREMITY / ANKLE BRACHIAL INDEX, LEFT    INFECTIONS / COMPLICATIONS:  none    DISPOSITION:  Patient is stable for discharge to: home  Patient will follow up with Vascular surgery clinic in 2 weeks and then 3 months for repeat duplex of the left lower extremity.    MEDICATIONS PRESCRIBED for DISCHARGE:   Current Discharge Medication List      START taking these medications    Details   rivaroxaban (XARELTO) 15 mg (42)- 20 mg (9) Tablet Dose Pack Take 1 tablet by mouth twice daily for 21  days then take 20mg  by mouth every day.  Qty: 51 tablet, Refills: 0         CONTINUE these medications which have CHANGED    Details   Rivaroxaban (XARELTO) 20 mg Tablet Take 1 tablet by mouth every day  Qty: 30 tablet, Refills: 11    Comments: This is the second script.  Script #1. Take 15 mg orally twice daily with food for 21 days followed by; Script #2.  20 mg orally once daily with food thereafter  Associated Diagnoses: Popliteal artery aneurysm; Popliteal artery thrombosis, left         CONTINUE these medications which have NOT CHANGED    Details   Albuterol (PROAIR HFA, PROVENTIL HFA, VENTOLIN HFA) 90 mcg/actuation inhaler Take 2 puffs by inhalation every 4 hours if needed for wheezing.  Qty: 17 g, Refills: 1    Associated Diagnoses: Chronic obstructive pulmonary disease, unspecified COPD type      Aspirin 81 mg Chewable Tablet Take 81 mg by mouth every day.      buPROPion HCl, smoking deter, 150 mg ER 12 hr Tablet Take 1 tablet by mouth 2 times daily.  Qty: 60 tablet, Refills: 2    Comments: 12 week course  Associated Diagnoses: Tobacco dependency      Clopidogrel (PLAVIX) 75 mg Tablet Take 1 tablet by mouth every morning.  Qty: 30 tablet, Refills: 5      Lisinopril (PRINIVIL, ZESTRIL) 20 mg Tablet Take 1 tablet by mouth every day. (blood pressure)  Qty: 90 tablet, Refills: 1      Mometasone-Formoterol 100-5 mcg/actuation HFA Aerosol Inhaler Take 1 puff by inhalation every 6 hours if needed.  Qty: 1 inhaler, Refills: 2    Associated Diagnoses: Chronic obstructive pulmonary disease, unspecified COPD type             Anti-platelet: ASA   Anti-coag: Xarelto  Statin Use: None    Wound care:  - Wash incision with soap and water    - Keep incision dry  - Do not submerge the wound (e.g. Swimming) for two weeks    Recommended Follow Up Appointments: 2 week follow-up with vascular clinic as well as 3 months with left lower extremity ultrasound  No follow-up provider specified.     Vascular Surgery Appointment:  You  will be contacted by our office within one week to schedule follow-up appointment.  If not contacted by our office, please call [name], Vascular RN @ [phone].           Scheduled Appointments:  No future appointments.     Electronically Signed on 11/02/2016 at 11:08 by:  Broadus John, MD      This patient was seen, evaluated, and care plan  was developed with the resident.  I agree with the assessment and plan as outlined in the resident's note.  Report electronically signed by Rosine Abe, MD. Attending

## 2016-11-01 NOTE — Nurse Focus (Signed)
R femoral sheath removed at 0914.  2 minutes occulusion held, then pressure held x 20 min.  No ozzing noted, busing and firmness around sheath site remains unchanged from prior to removal.  Vitals remained stable throughout removal.  Sandbag placed, continue to assess for s/s of worsening hematoma.  Report given to primary RN, Lorene Dyhristie.

## 2016-11-01 NOTE — Plan of Care (Signed)
Problem: Patient Care Overview (Adult)  Goal: Plan of Care Review  Outcome: Ongoing (interventions implemented as appropriate)   11/01/16 0722   Plan of Care Review   Progress improving   Goal Outcome Evaluation Note    Kenneth Gordon is a 21yrmale admitted 10/29/2016    OUTCOME SUMMARY AND PLAN MOVING FORWARD:   Heparin gtt @ 1200 units/hr through night, discontinued at 0645 this am in anticipation of arterial sheath pull.  Left pedal pulse palpable and post tibial by doppler detected this am.  Medicated for pain and elevated BP x 1.      Problem: Sleep Pattern Disturbance (Adult)  Goal: Adequate Sleep/Rest  Patient will demonstrate the desired outcomes by discharge/transition of care.   Outcome: Ongoing (interventions implemented as appropriate)      Problem: Activity Intolerance (Adult)  Goal: Activity Tolerance  Patient will demonstrate the desired outcomes by discharge/transition of care.   Outcome: Ongoing (interventions implemented as appropriate)    Goal: Effective Energy Conservation Techniques  Patient will demonstrate the desired outcomes by discharge/transition of care.   Outcome: Ongoing (interventions implemented as appropriate)      Problem: Pressure Ulcer Risk (Braden Scale) (Adult,Obstetrics,Pediatric)  Goal: Identify Related Risk Factors and Signs and Symptoms  Related risk factors and signs and symptoms are identified upon initiation of Human Response Clinical Practice Guideline (CPG)   Outcome: Outcome(s) achieved Date Met: 11/01/16    Goal: Skin Integrity  Patient will demonstrate the desired outcomes by discharge/transition of care.   Outcome: Ongoing (interventions implemented as appropriate)

## 2016-11-01 NOTE — Progress Notes (Addendum)
VASCULAR SURGERY DAILY PROGRESS NOTE  09:41 11/02/2016   Vascular Attending: Alfredia Ferguson, MD PI# [phone]  LOS: 4     ID: [name] is a 71 yrold man admitted for thrombolysis of occluded L popliteal stent    PROCEDURES (include date):  10/30/2016-Dr. Arita Gordon  Balloon angioplasty of the popliteal artery. Mechanical thrombectomy lysis catheter placement.    10/31/2016-Dr. Pevec  Balloon angioplasty of the left popliteal artery, stent grafting of the left popliteal artery, aspiration thromboembolectomy of the left tibioperoneal and posterior tibial arteries. Mechanical thromboembolectomy of the left tibioperoneal and posterior tibial arteries.Bilateral lower extremity arteriography.    INTERVAL HISTORY/SUBJECTIVE:   POD 2   NAE O/N  Pain well controlled  Heparin infusing    OBJECTIVE:   Vital Signs   Current  Minimum Maximum   BP BP: 132/68  BP: (98-150)/(55-73)    Temp Temp: 36.4 C (97.5 F)  Temp Min: 36 C (96.8 F)  Temp Max: 36.7 C (98 F)    Pulse Pulse: 88 Pulse Min: 81  Pulse Max: 99    Resp Resp: 18 Resp Min: 15  Resp Max: 27    O2 Sat SpO2: 95 % SpO2 Min: 94 % SpO2 Max: 98 %     Intake and Output: Last Two Completed Shifts:  I/O Last 2 Completed Shifts:  In: 294.8 [Oral:240; Crystalloid:54.8]  Out: 690 [Urine:690]    Intake/Output Summary (Last 24 hours) at 11/01/16 1257  Last data filed at 11/01/16 0800   Gross per 24 hour   Intake           1596.4 ml   Output             1910 ml   Net           -313.6 ml       PHYSICAL EXAM:  General: Alert, calm, no acute distress.   Heart: Normal rate and regular rhythm, no murmurs.   Lungs: Clear to auscultation.   Abdomen: Soft, non-tender, non-distended.   Extremities: Increased warmth, color and sensation in L foot. R groin with small stable hematoma but some new blood soaking his dressing    VASCULAR PULSE EXAM   Carotid Rad Fem PT DP   RIGHT Present (no bruit) Present  Present (no bruit)  Present Present    LEFT Present (no bruit) Present   Present (no bruit) Doppler signal Present         LAB TESTS/STUDIES:       Recent labs for the past 72 hours     11/02/16 0441 11/01/16 0442 10/31/16 0336    SODIUM 137 136 136    POTASSIUM 4.1 4.1 4.7    CHLORIDE 104 103 102    CARBON DIOXIDE TOTAL 26 25 28     UREA NITROGEN, BLOOD (BUN) 14 13 13     CREATININE BLOOD 0.92 0.86 0.88    GLUCOSE 98 92 136*    CALCIUM 8.8 8.6 8.7    MAGNESIUM (MG) -- -- --    PHOSPHORUS (PO4) -- -- --    ALBUMIN -- -- --        Recent labs for the past 72 hours     11/02/16 0441 11/01/16 1155 11/01/16 0442 10/31/16 1148 10/31/16 0141 10/30/16 1740    WHITE BLOOD CELL COUNT 9.7 10.7 9.7 7.6 10.5 6.6    HEMOGLOBIN 14.1 14.6 14.2 15.1 16.0 14.7    HEMATOCRIT 42.6 43.9 42.0 44.5 48.0 44.5    PLATELET COUNT 157  149 138 153 175 173        Recent labs for the past 72 hours     11/01/16 1155    INR 1.07      Recent labs for the past 72 hours     11/01/16 1155    PROTHROMBIN TIME --    APTT 26.5      POC Glucose, blood: --      ASSESSMENT & PLAN:    Kenneth Gordon He is a [age]yr man presenting withsymptomatic occlusion of left popliteal artery stent as well as occluded distal SFA currently. POD 3 s/p angiogram and tPa infusion, POD 2 thromboembolectomy and stenting.    #Left Popliteal stent and SFA occlusion  -S/p Angiogramwith tPA infusion thrombolysis (8/8) followed by thromboembolectomy and stenting (8/9)  -Pulled sheath yesterday  -IV Heparin will be continued until results of R groin duplex- if WNL will be replaced with Xarelto  -ASA 81 mg  -Patient counseled on importance of not smoking, taking Bupropion    #HTN  -Lisinopril 20mg  daily    #Pain  -Tylenol 1000mg  Q6  -Gabapentin 300mg  TID  -Oxycodone 5-10mg   -Dilaudid 0.5-2mg     #Dispo  -Home today    The above assessment and plan was discussed with and agreed upon by the Vascular Surgery Junior/Chief resident.    Kenneth JohnScott Myers, MD  PGY1  PII: [phone], Pager: [phone]  Vascular Surgery Service Pager: [phone]    This patient was seen,  evaluated, and care plan was developed with the resident.  I agree with the assessment and plan as outlined in the resident's note.  Report electronically signed by Kenneth FarrierNasim Eimy Plaza, MD. Attending

## 2016-11-01 NOTE — Transfer Summaries (Signed)
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 Patient was seen in clinic for six-month follow-up of his popliteal artery aneurysm stent of the left leg. He reported that 2 weeks ago he developed left calf tenderness at rest with claudication as well as a tingling sensation in the distal aspect of his toes. Patient reported that the tenderness and claudication are no longer present but the tingling sensation is still there. Patient reports that he still smokes half a pack a day and is taking aspirin and Plavix.    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 [name]) Balloon angioplasty of the L popliteal artery. Mechanical thrombectomy lysis catheter placement.   8/9: (Dr. Larwance Sachs) Balloon angioplasty of the left popliteal artery, stent grafting of the left popliteal artery, aspiration thromboembolectomy of the left tibioperoneal and posterior tibial arteries. Mechanical thromboembolectomy of the left tibioperoneal and posterior tibial arteries.Bilateral lower extremity arteriography.     Consultation(s):   None    Hospital Course:   Mr. Kuyper was admitted to the hospital for a  planned angiogram, thrombolysis with tPA infusion and balloon angioplasty of L popliteal stent. He was admitted to the ICU while he had his heparin and tPA catheter. His pain post-procedure was significant, and he was placed on multimodal pain that seemed to help. He underwent an additional mechanical thrombectomy on 8/9, and remained on ICU status post-procedure. His pain has improved, and his heparin infusion discontinued on 8/10.     Complication(s): None    Pertinent Imaging:  Vein mapping of bilateral upper and lower extremities was completed for surgical planning for potential bypass graft.     Transfer diet: Regular Diet    Plan at time of transfer: per daily [facility] progress note.    NEURO  24hr events: no acute event overnight   Exam: A&O x3  Pain: scheduled tylenol, gaba, oxy, dilaudid  A/P: Continue multimodal pain control, limiting narcotics as able.     CARDIOVASCULAR  24hr events: HTN, hydralazine 10mg  q4  Exam: RRR  BP: 160/71 BP: (134-182)/(68-93)   Pulse: 94 Pulse Min: 65 Max: 94  Meds: hydralazine 10 mg q4  A/P:   #Left Popliteal stent and SFA occlusion, no acute complications  -S/p continued catheter lysis of thrombosis   -Intracatheter Heparin infusing and heparin gtt  -Strict Bedrest  -ASA 81 mg  -Plavix 75 mg  -Continue vascular check to LLE with doppler for DP and PT     PULMONARY  24hr events: No acute events  Exam: regular work of breathing on room air  Resp:  19 Resp Min: 12 Max: 25  SpO2: 97 % room air SpO2 Min: 94 % Max: 98 %  Meds: none  A/P:Routine pulmonary hygiene, IS.      GI/NUTRITION  24 hours events: regular diet  Exam: soft, no distention  Nutrition: Regular Diet   Enteral:    Last Bowel Movement: 10/28/16 (10/29/16 1633)   GI PPx:  Bowel Care: colace, senna  A/P: Continue regular diet and routine bowel care to assist with constipation while on narcotics.    GU/RENAL/FLUIDS  24 hours events:None  Exam: urinating into urinal  Fluids:  IVF:    I/O Last 2 Completed Shifts:  In:  2856.7 [Oral:400; Crystalloid:2456.7]  Out: 2660 [Urine:2660]  UOP: NR  Electrolytes:        Recent labs for the past 48 hours    11/01/16 0442 10/31/16 0336    SODIUM 136 136    POTASSIUM 4.1 4.7    CHLORIDE 103 102    CARBON DIOXIDE TOTAL 25 28    UREA NITROGEN, BLOOD (BUN) 13 13    CREATININE BLOOD 0.86 0.88    CALCIUM 8.6 8.7    MAGNESIUM (MG) -- --    PHOSPHORUS (PO4) -- --      A/P:Continue to monitor UOP. Replace electrolytes PRN.    ENDOCRINE  POC Glucose, blood: --  Insulin:  None  Meds:  None  A/P: Continue to monitor for hyperglycemia, goal glucose <180.    HEMATOLOGY         Recent labs for the past 48 hours    11/01/16 0442 10/31/16 1148 10/31/16 0141    HEMOGLOBIN 14.2 15.1 16.0    HEMATOCRIT 42.0 44.5 48.0    PLATELET COUNT 138 153 175    APTT -- -- --    INR -- -- --        No results found for this basename: APTT:3,INR:3 in the last 48 hours   DVT PPx:    A/P: Continue heparin at 1200 units, and check anti-Xa in the am    ID  Temp: 36.3 C (97.3 F)  Temp Min: 36.1 C (97 F) Max: 36.6 C (97.9 F)         Recent labs for the past 48 hours    11/01/16 0442 10/31/16 1148 10/31/16 0141    WHITE BLOOD CELL COUNT 9.7 7.6 10.5     Antibiotics: None    Culturelle: Not indicated  A/P:  Monitor for signs and symptoms of infection, none currently.     MUSCULOSKELETAL  Fractures: None  Exam: moving all extremities, LLE dusky, cool  Activity:Ad lib  PT/OT: Eval pending  A/P: per primary      MEDICATIONS ON TRANSFER:  Acetaminophen (TYLENOL) Tablet 1,000 mg, ORAL, Q6H  Aspirin Chewable Tablet 81 mg, ORAL, QAM  BuPROPion (WELLBUTRIN SR, ZYBAN) SR 12hr Tablet 150 mg, ORAL, BID  Docusate (COLACE) Capsule 100 mg, ORAL, BID  Gabapentin (NEURONTIN) Capsule 300 mg, ORAL, TID  Lisinopril (PRINIVIL, ZESTRIL) Tablet 20 mg, ORAL, QAM  Sennosides (SENOKOT) Tablet 17.2 mg, ORAL, Daily Bedtime      Heparin, 100-3,000 Units/hr, IV, CONTINUOUS      Atropine Injection 0.5 mg, IV, Q2MIN PRN  Heparin 100 units/mL  (Bolus from Infusion Bag) 2,500 Units, IV, PRN  HydrALAZINE (APRESOLINE) Injection 10-20 mg, IV, Q4H PRN  Hydromorphone (DILAUDID) Injection 0.5-1 mg, IV, Q4H PRN  NaCl 0.9% Bolus 250 mL, IV, PRN  Oxycodone (ROXICODONE) Tablet 5-10 mg, ORAL, Q4H PRN  Studies Pending at Time of Transfer:   - Arterial duplex lower extremity, LEFT    Incidentals: None.    Sign-out communicated to Vascular Surgery at 16:16 on 11/01/16.     Report electronically signed by:  Javier Glazier, MD PGY1  [facility]  Pg: [id number] PI: [id number]

## 2016-11-01 NOTE — Nurse Assessment (Signed)
ASSESSMENT NOTE    Note Started: 11/01/2016, 19:17     Initial assessment completed and recorded in EMR.  Report received from day shift nurse and orders reviewed. Plan of Care reviewed and appropriate, discussed with patient.  Bettina GaviaAlejandro Layson Bertsch, RN RN

## 2016-11-02 ENCOUNTER — Inpatient Hospital Stay (HOSPITAL_COMMUNITY): Payer: Medicare Other

## 2016-11-02 ENCOUNTER — Other Ambulatory Visit: Payer: Self-pay

## 2016-11-02 DIAGNOSIS — T82838A Hemorrhage of vascular prosthetic devices, implants and grafts, initial encounter: Secondary | ICD-10-CM

## 2016-11-02 LAB — CBC NO DIFFERENTIAL
HEMATOCRIT: 42.6 % (ref 41.0–53.0)
HEMOGLOBIN: 14.1 g/dL (ref 13.5–17.5)
MCH: 30 pg (ref 27.0–33.0)
MCHC: 33.1 % (ref 32.0–36.0)
MCV: 90.6 UM3 (ref 80.0–100.0)
MPV: 8 UM3 (ref 6.8–10.0)
PLATELET COUNT: 157 10*3/uL (ref 130–400)
RDW: 15.3 % — AB (ref 0.0–14.7)
RED CELL COUNT: 4.7 10*6/uL (ref 4.50–5.90)
WHITE BLOOD CELL COUNT: 9.7 10*3/uL (ref 4.5–11.0)

## 2016-11-02 LAB — BASIC METABOLIC PANEL
CALCIUM: 8.8 mg/dL (ref 8.6–10.5)
CARBON DIOXIDE TOTAL: 26 mmol/L (ref 24–32)
CREATININE BLOOD: 0.92 mg/dL (ref 0.44–1.27)
Chloride: 104 mmol/L (ref 95–110)
GLUCOSE: 98 mg/dL (ref 70–99)
POTASSIUM: 4.1 mmol/L (ref 3.3–5.0)
SODIUM: 137 mmol/L (ref 135–145)
Urea Nitrogen, Blood (BUN): 14 mg/dL (ref 8–22)

## 2016-11-02 LAB — ANTI-XA HEPARIN ASSAY: HEPARIN UFH: 0.39 U/mL

## 2016-11-02 MED ORDER — RIVAROXABAN 10 MG TABLET
10.0000 mg | ORAL_TABLET | Freq: Every day | ORAL | Status: DC
Start: 2016-11-02 — End: 2016-11-02

## 2016-11-02 MED ORDER — RIVAROXABAN 10 MG TABLET
15.0000 mg | ORAL_TABLET | Freq: Two times a day (BID) | ORAL | Status: DC
Start: 2016-11-02 — End: 2016-11-02
  Administered 2016-11-02: 15 mg via ORAL
  Filled 2016-11-02: qty 2

## 2016-11-02 MED ORDER — HEPARIN (PORCINE) 25,000 UNIT/250 ML (100 UNIT/ML) IN DEXTROSE 5 % IV
18.0000 [IU]/kg/h | INTRAVENOUS | Status: DC
Start: 2016-11-02 — End: 2016-11-02

## 2016-11-02 MED ORDER — DABIGATRAN ETEXILATE 150 MG CAPSULE
150.0000 mg | ORAL_CAPSULE | Freq: Two times a day (BID) | ORAL | 0 refills | Status: DC
Start: 2016-11-02 — End: 2016-11-02
  Filled 2016-11-02: qty 60, 30d supply, fill #0

## 2016-11-02 MED ORDER — APIXABAN 5 MG TABLET
5.0000 mg | ORAL_TABLET | Freq: Two times a day (BID) | ORAL | 0 refills | Status: DC
Start: 2016-11-02 — End: 2016-11-02
  Filled 2016-11-02: qty 60, 30d supply, fill #0

## 2016-11-02 MED ORDER — RIVAROXABAN 10 MG TABLET
15.0000 mg | ORAL_TABLET | Freq: Two times a day (BID) | ORAL | Status: DC
Start: 2016-11-02 — End: 2016-11-02

## 2016-11-02 NOTE — Discharge Instructions (Signed)
Procedure Performed: Angiography with intervention  Attending Surgeon: Wynetta Finesavid Dawson, MD/William Larwance SachsPevec, MD  Date: 11/01/2016    Incision Care:  You may remove the bandages from your groin today. You may shower starting tomorrow.      You may have noticed some swelling at your surgical sites. The area may bruise over the next several days. This is normal for your operation.        Activity:  No strenuous exercise for 1 weeks.  You may otherwise walk and exercise without restriction.      Your legs may be swollen after the operation.  This is normal.  Keep your legs elevated as much as possible, in between activities.    You may not drive while taking narcotic pain medication.      Medications:  Resume your home medications as directed.   *Take Aspirin daily.   *Take Xarelto daily.      Precautions:  If you have any of the following problems, please call Shelby MattocksJanet Wells, Vascular Surgery RN,at [phone], during business hours, or the hospital operator at [phone] after hours and ask for the vascular surgeon on call:     * Temp greater than 101.5 degrees.   * Redness, swelling and drainage from incision.   * Worsening pain, not relieved by pain medication.   * Persistent vomiting and inablility to tolerate fluids.      For Chest pain, Fainting, Dizziness, Seizures, Trouble Breathing or any other Medical Emergencies call 911       Follow-up Appointments:  You will be seen in the vascular surgery clinic in approximately follow up within 2 weeks before leaving for [place] and 3 months for follow-up with an ultrasound. If you do not hear from us within 1 week, please call Shelby MattocksJanet Wells, RN at [phone].       The Vascular Clinic is located in the [facility] at:  [address]  [place], [place] [id number]  [phone]  Vascular clinic - [address]

## 2016-11-02 NOTE — Nurse Assessment (Signed)
ASSESSMENT NOTE    Note Started: 11/02/2016, 08:47     Initial assessment completed.  Report received from night shift nurse and orders reviewed. Plan of Care reviewed and appropriate, discussed with patient.  Venda RodesMorgan Moesha Sarchet, RN RN

## 2016-11-02 NOTE — Plan of Care (Signed)
Problem: Patient Care Overview (Adult)  Goal: Plan of Care Review  Outcome: Ongoing (interventions implemented as appropriate)    Goal: Individualization and Mutuality  Outcome: Ongoing (interventions implemented as appropriate)    Goal: Discharge Needs Assessment  Outcome: Ongoing (interventions implemented as appropriate)      Problem: Sleep Pattern Disturbance (Adult)  Goal: Adequate Sleep/Rest  Patient will demonstrate the desired outcomes by discharge/transition of care.   Outcome: Ongoing (interventions implemented as appropriate)      Problem: Activity Intolerance (Adult)  Goal: Activity Tolerance  Patient will demonstrate the desired outcomes by discharge/transition of care.   Outcome: Ongoing (interventions implemented as appropriate)    Goal: Effective Energy Conservation Techniques  Patient will demonstrate the desired outcomes by discharge/transition of care.   Outcome: Ongoing (interventions implemented as appropriate)      Problem: Pressure Ulcer Risk (Braden Scale) (Adult,Obstetrics,Pediatric)  Goal: Skin Integrity  Patient will demonstrate the desired outcomes by discharge/transition of care.   Outcome: Ongoing (interventions implemented as appropriate)      Comments: AAO x4. VSS OK. OOB/ambulating independently. Denies pain. R groin insertion/wound site c/d/i with gauze. Voiding. No BM. PIV x3. Hep gtt at goal (1400 u/hr).

## 2016-11-02 NOTE — Nurse Discharge Note (Signed)
Discharge Date: 11/02/16  Time: 1610  Discharged patient to home with  son in stable condition. Discharge instructions explained & understood by patient. Discontinued three IV's, all intact. Discharged patient via wheelchair with all paperwork and belongings. Patient going to pharmacy to pick up prescription.  Educational materials provided on new Xarelto medication. Patient denies questions. Patient also signed release of belongings form.  Venda RodesMorgan Somer Trotter RN

## 2016-11-04 NOTE — Telephone Encounter (Signed)
Patients diagnosis is not a covered benefit for hyperbaric therapy.  I have explained this via email to [name].

## 2016-11-04 NOTE — Telephone Encounter (Signed)
From: Renard Hamperobert G Deboer  To: Desiree Haneavid Lee Dawson, MD  Sent: 11/04/2016 9:06 AM PDT  Subject: Non-urgent Medical Advice Question    Good Morning Dr. Arita Missawson    I hope you had a good time on your trip.     My son is getting Mild-Hyperbaric O[facility] (mHBOT) [facility] [place]. I was wondering if it would be safe and help repair any damage to my foot/leg while I am here in the [place].

## 2016-11-05 NOTE — Progress Notes (Signed)
The patient was seen and evaluated with the vascular surgery resident, Dr. Myers, who has documented the history, examination findings, and our plan. I agree with the resident's documentation, as noted in the EMR records. I counseled the patient about the plan of care and addressed education needs. There were no barriers to learning and the patient indicated understanding of the plan of care.    Report electronically signed by   Antonina Deziel L. Jillian Warth, MD, FACS  Professor, Vascular and Endovascular Surgery  PI# [id number]  Pager [phone]

## 2016-11-15 ENCOUNTER — Ambulatory Visit (HOSPITAL_BASED_OUTPATIENT_CLINIC_OR_DEPARTMENT_OTHER): Payer: Medicare Other

## 2016-11-15 ENCOUNTER — Ambulatory Visit
Admission: RE | Admit: 2016-11-15 | Discharge: 2016-11-15 | Disposition: A | Discharge status: Home / Self Care | Payer: Medicare Other | Source: Ambulatory Visit | Attending: Urology | Admitting: Urology

## 2016-11-15 ENCOUNTER — Ambulatory Visit (HOSPITAL_BASED_OUTPATIENT_CLINIC_OR_DEPARTMENT_OTHER): Payer: Medicare Other | Admitting: Urology

## 2016-11-15 VITALS — BP 118/75 | HR 96 | Temp 97.1°F | Resp 23 | Wt 174.6 lb

## 2016-11-15 VITALS — BP 116/72 | HR 91 | Temp 97.9°F

## 2016-11-15 DIAGNOSIS — R310 Gross hematuria: Principal | ICD-10-CM

## 2016-11-15 DIAGNOSIS — I724 Aneurysm of artery of lower extremity: Principal | ICD-10-CM | POA: Insufficient documentation

## 2016-11-15 NOTE — Patient Instructions (Addendum)
PRE-OPERATIVE PATIENT INSTRUCTIONS      1.  WHERE TO GO / WHEN TO BE THERE: (see map)         Please report to Admissions (Room 210, First Floor of the [facility]) on 11/21/2016. You will be contacted by the operating room staff on 11/20/2016 afternoon, with the time you are to arrive. If you prefer, you can call them after 3:00 pm, at [phone].     2.  FASTING GUIDELINES:         Solid food should be finished 8 hours prior to your arrival time.  Continue to drink clear liquids up to 2 hours before the time you have been instructed to arrive. Clear liquids include water, clear fruit juice (no pulp), black coffee or tea (without cream or milk).  No jello. Limit carbonated beverages to one serving only and NO alcohol containing beverages.          3.  MEDICATION GUIDELINES:         STOP TAKING Xarelto NOW.         Do not take the following medications on the day of your procedure:  Lisinopril             Please do take the following medications the day of surgery: Bupropion.  Please DO NOT STOP your Asprin prior to the surgery.         You will be contacted by phone from the [facility] for a pre-anesthesia nursing screen within 1-2 weeks prior to your surgery to update your medical history, medication and preoperative instructions  .   4.  GENERAL INFORMATION         Take a bath or shower on the morning of the procedure to reduce skin bacteria and wear clean, loose clothing. Please leave all cosmetics, jewelry and other valuables at home.         Bring the insurance card on the day of the procedure.          On the day you are discharged, you must have adequate transportation home and be accompanied by a responsible adult. [name] RN, the inpatient nurse coordinator is available most weekdays, to assist you and your family in your transition home. Her phone number is [phone] (she can also be paged by a member of the nursing staff when needed.)             If your health changes or you have  any questions concerning this procedure, call a resource nurse at the [facility] at [phone].  After 5pm, or on weekends and holidays call the hospital operator at [phone] and ask for the Vascular surgeon on call.  Thank you.      Femoropopliteal Bypass: What to Expect at Home  Your Recovery  You will have some pain from the cuts (incisions) the doctor made. This usually gets better after about 1 week. Your doctor will give you pain medicine for this. You can expect your leg to be swollen at first. This is a normal part of recovery and may last 2 or 3 months.  You will have stitches or staples in the incisions. If you have stitches, they may dissolve on their own. Or your doctor may take them out 7 to 14 days after your surgery.  You will need to take it easy for 2 to 6 weeks at home. It may take 6 to 12 weeks to fully recover.  After surgery, blood may  flow better throughout your leg, which can decrease leg pain, numbness, and cramping. You will need to have regular checkups with your doctor to make sure the graft is working.  This care sheet gives you a general idea about how long it will take for you to recover. But each person recovers at a different pace. Follow the steps below to get better as quickly as possible.  How can you care for yourself at home?  Activity   Rest when you feel tired. Getting enough sleep will help you recover.   Try to walk each day or as often as your doctor tells you. Start by walking a little more than you did the day before. Bit by bit, increase the amount you walk. Walking boosts blood flow and helps prevent pneumonia and constipation.   Avoid strenuous activities, such as bicycle riding, jogging, weight lifting, or aerobic exercise, until your doctor says it is okay.   Ask your doctor when you can drive again.   If you work, you will probably need to take 2 to 6 weeks off, depending on your job.   You may shower, if your doctor says it is okay. Do not  take a bath for the first 2 weeks, or until your doctor tells you it is okay.  Diet   You can eat your normal diet. If your stomach is upset, try bland, low-fat foods like plain rice, broiled chicken, toast, and yogurt.   Drink plenty of fluids (unless your doctor tells you not to).   You may notice that your bowel movements are not regular right after your surgery. This is common. You may want to take a fiber supplement every day. If you have not had a bowel movement after a couple of days, ask your doctor about taking a mild laxative.  Medicines   Your doctor will tell you if and when you can restart your medicines. He or she will also give you instructions about taking any new medicines.   If you take blood thinners, such as warfarin (Coumadin), clopidogrel (Plavix), or aspirin, be sure to talk to your doctor. He or she will tell you if and when to start taking those medicines again. Make sure that you understand exactly what your doctor wants you to do.   Be safe with medicines. Take your medicines exactly as prescribed. Call your doctor if you think you are having a problem with your medicine.   Take pain medicines exactly as directed.   If the doctor gave you a prescription medicine for pain, take it as prescribed.   If you are not taking a prescription pain medicine, ask your doctor if you can take an over-the-counter medicine.   If you think your pain medicine is making you sick to your stomach:   Take your medicine after meals (unless your doctor has told you not to).   Ask your doctor for a different pain medicine.   If your doctor prescribed antibiotics, take them as directed. Do not stop taking them just because you feel better. You need to take the full course of antibiotics.   Your doctor may prescribe a blood thinner when you go home. This helps prevent blood clots. Be sure you get instructions about how to take your medicine safely. Blood thinners can cause serious bleeding  problems.  Incision care   If you have bandages on the incisions, follow your doctor's instructions about changing them.   If you have strips of tape on the  incisions, leave the tape on for a week or until it falls off.   Wash the area daily with warm, soapy water, and pat it dry. Don't use hydrogen peroxide or alcohol, which can slow healing. You may cover the area with a gauze bandage if it weeps or rubs against clothing. Change the bandage every day.   Keep the area clean and dry.  Elevation   Prop up your leg on a pillow anytime you sit or lie down for the first 3 days. Try to keep it above the level of your heart. This will help reduce swelling.  Follow-up care is a key part of your treatment and safety. Be sure to make and go to all appointments, and call your doctor if you are having problems. It's also a good idea to know your test results and keep a list of the medicines you take.  When should you call for help?  Call 911 anytime you think you may need emergency care. For example, call if:   You passed out (lost consciousness).   You have severe trouble breathing.   You have sudden chest pain and shortness of breath, or you cough up blood.   You have symptoms of a heart attack. These may include:   Chest pain or pressure, or a strange feeling in the chest.   Sweating.   Shortness of breath.   Nausea or vomiting.   Pain, pressure, or a strange feeling in the back, neck, jaw, or upper belly or in one or both shoulders or arms.   Lightheadedness or sudden weakness.   A fast or irregular heartbeat.  After you call 911, the operator may tell you to chew 1 adult-strength or 2 to 4 low-dose aspirin. Wait for an ambulance. Do not try to drive yourself.  Call your doctor now or seek immediate medical care if:   You have severe pain in your leg, or it becomes cold, pale, blue, tingly, or numb.   You have pain that does not get better after you take pain medicine.   You have loose stitches, or your  incisions come open.   You are bleeding a lot from the incisions.   You have signs of infection, such as:   Increased pain, swelling, warmth, or redness.   Red streaks leading from the incision.   Pus draining from the incision.   A fever.   You are sick to your stomach or cannot keep fluids down.  Watch closely for any changes in your health, and be sure to contact your doctor if:   You are not getting better as expected.   Where can you learn more?   Go to [employer]  Enter [phone] in the search box to learn more about "Femoropopliteal Bypass: What to Expect at Home."    2006-2015 [employer]. Care instructions adapted under license by [facility]. This care instruction is for use with your licensed healthcare professional. If you have questions about a medical condition or this instruction, always ask your healthcare professional. [employer], Inc[employer] disclaims any warranty or liability for your use of this information.  Content Version: 10.6.465758; Current as of: Jul 26, 2013              Learning About Femoropopliteal Bypass Surgery for Peripheral Arterial Disease  What is a femoropopliteal bypass?     A femoropopliteal (fem-pop) bypass is surgery to change the flow of your blood so it goes around blocked blood vessels.  Blocked  blood vessels can be above or below your knee. They are caused by peripheral arterial disease.  The surgery is done to increase blood flow to the legs. This may allow you to walk farther.  To do this surgery, your doctor will use something called a graft. The graft can be a vein taken from another place in your leg. Or it can be a man-made blood vessel.  The doctor sews the graft onto your femoral and popliteal arteries. Then your blood goes through this new graft vessel instead of the blocked one.  How is the surgery done?  You may be asleep during the surgery. Or you may get medicine to numb your lower body and prevent  pain.  The doctor will make a cut (incision) in your thigh. He or she may make another cut in the inside of your calf just below the knee.  If the doctor is using one of your veins for a graft, he or she will make another cut in your leg to remove this vein.  The doctor then connects one end of the graft to the femoral artery in your thigh. The other end is connected to the popliteal artery above or below your knee.  After the graft is in place and the blood is flowing through it, the doctor will close the incisions with stitches or staples.  What can you expect after the surgery?  You will probably stay 2 to 4 days in the hospital.  You will have some pain from the incisions. This usually gets better after about 1 week.  Your leg may be swollen at first. This is normal. It may last 2 or 3 months.  You will need to take it easy for at least 2 to 6 weeks at home. It may take 6 to 12 weeks to fully recover.  You will probably need to take at least 2 to 6 weeks off from work. It depends on the type of work you do and how you feel.  Follow-up care is a key part of your treatment and safety. Be sure to make and go to all appointments, and call your doctor if you are having problems. It's also a good idea to know your test results and keep a list of the medicines you take.   Where can you learn more?   Go to [employer]  Enter [phone] in the search box to learn more about "Learning About Femoropopliteal Bypass Surgery for Peripheral Arterial Disease."    2006-2015 [employer]. Care instructions adapted under license by [facility]. This care instruction is for use with your licensed healthcare professional. If you have questions about a medical condition or this instruction, always ask your healthcare professional. [employer], [employer] disclaims any warranty or liability for your use of this information.  Content Version: 10.6.465758; Current as of: May 14, 2013

## 2016-11-15 NOTE — Progress Notes (Addendum)
VASCULAR SURGERY  HISTORY AND PHYSICAL    CC: f/u after hospitalization for left popliteal stent thrombosis    HPI: Kenneth Gordon is a 71yr old male with hx of left popliteal aneurysm, s/p covered stent placement on 05/01/16; c/b left popliteal stent thrombosis s/p popliteal angioplasty and lysis 10/30/16;  angioplasty, thrombectomy of the popliteal artery, and stenting of the popliteal artery, mechanical thromboembolectomy of the left tibioperoneal and posterior tibial arteries on 10/31/16.  The patient was discharged to home on Eliquis will plan for 2 week follow-up.  He returns now for follow-up.  The patient states that he has done well since his hospitalization.  He no longer has any pain in his left calf or foot at rest or with ambulation.  He denies any new nonhealing wounds in his left lower extremity.  He has, however, noted gross hematuria in the last 2 days.  He has no prior history of hematuria or issues with urination.  He additionally notes some continued swelling in his right groin access site which he states is not tender to palpation and has not drained any blood or purulent fluid.  He has not noted any overlying skin changes on his access site aside from resolving ecchymosis.      PMHx:  Past Medical History:   Diagnosis Date    Allergic rhinitis     COPD (chronic obstructive pulmonary disease)     Essential hypertension     NO SIGNIFICANT HISTORY (aka NONE)     Psoriasis     Tobacco dependence    :    PSHx:  05/01/16 [name]):  1. Aortogram with bilateral iliofemoral runoff.  2. Left lower extremity angiogram.  3. Selective catheterization, fourth order, left posterior tibial artery.  4. Use of distal embolic protection device.  5. Use of intravascular ultrasound for evaluation of popliteal artery.  6. Placement of self-expanding stents (Viabahn 7 x 100 mL and 9 x 50 mL) in the popliteal artery.  7. Post-stenting angioplasty of the popliteal artery using 6 x 60 [place] and 8 x 20  Charger balloon catheters.  8. Use of Preclose closure technique (ProGlide x2). 9. Conscious sedation under continuous hemodynamic monitoring.    10/30/2016-DrArita Miss  Balloon angioplasty of the popliteal artery. Mechanical thrombectomy lysis catheter placement.    10/31/2016-Dr. Pevec  Balloon angioplasty of the left popliteal artery, stent grafting of the left popliteal artery, aspiration thromboembolectomy of the left tibioperoneal and posterior tibial arteries. Mechanical thromboembolectomy of the left tibioperoneal and posterior tibial arteries.Bilateral lower extremity arteriography.    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 17 g 1    Aspirin 81 mg Chewable Tablet Take 81 mg by mouth every day.      buPROPion HCl, smoking deter, 150 mg ER 12 hr Tablet Take 1 tablet by mouth 2 times daily. 60 tablet 2    Lisinopril (PRINIVIL, ZESTRIL) 20 mg Tablet Take 1 tablet by mouth every day. (blood pressure) 90 tablet 1    Mometasone-Formoterol 100-5 mcg/actuation HFA Aerosol Inhaler Take 1 puff by inhalation every 6 hours if needed. 1 inhaler 2    rivaroxaban (XARELTO) 15 mg (42)- 20 mg (9) Tablet  Dose Pack Take 1 tablet by mouth twice daily for 21 days then take 20mg  by mouth every day. 51 tablet 0    Rivaroxaban (XARELTO) 20 mg Tablet Take 1 tablet by mouth every day 30 tablet 11     No current facility-administered medications on file prior to visit.        Allergies:  Review of patient's allergies indicates no known allergies.    REVIEW OF SYSTEMS  Positive for: Hematuria, bruising  Pt denies: fevers, chills, wt loss, changes in vision, hearing loss, numbness, paralysis, cough, sob, dyspnea with exertion, chest pain, orthopnea, leg pain with ambulation, change in appetite, diarrhea, constipation, dysuria, nocturia, weakness.    PE:  Temp src: Tympanic (08/24 1043)  Temp:  [36.2 C (97.1 F)]   Pulse:  [96]   BP: (118)/(75)   Resp:  [23]   SpO2: --    GEN - comfortable, no acute distress, pleasant and conversant  HEART - normal rate  LUNGS - breathing comfortably on room air  ABD - soft, non-tender, non-distended   EXT - right groin with palpable pulsatile walnut-sized mass.  Right medial thigh with resolving ecchymosis.  Both feet are warm to palpation with no wounds.  RLE: Palpable right femoral, popliteal, DP, and PT pulses  LLE: Palpable left femoral.  Nonpalpable left popliteal, DP, and PT pulses.  Left PT with triphasic Doppler signal, left DP with biphasic Doppler signal.    LABORATORY DATA:  Lab Results   Lab Name Value Date/Time    WBC 9.7 11/02/2016 04:41 AM    WBC 8.2 12/30/2014 07:14 AM    HGB 14.1 11/02/2016 04:41 AM    HGB 16.5 12/30/2014 07:14 AM    HCT 42.6 11/02/2016 04:41 AM    HCT 49.6 12/30/2014 07:14 AM    PLT 157 11/02/2016 04:41 AM    PLT 197 12/30/2014 07:14 AM       Lab Results   Lab Name Value Date/Time    NA 137 11/02/2016 04:41 AM    NA 139 12/30/2014 07:14 AM    K 4.1 11/02/2016 04:41 AM    K 4.3 12/30/2014 07:14 AM    CL 104 11/02/2016 04:41 AM    CL 100 12/30/2014 07:14 AM    CO2 26 11/02/2016 04:41 AM    CO2 30 12/30/2014 07:14 AM    BUN 14 11/02/2016 04:41 AM    BUN 17  12/30/2014 07:14 AM    CR 0.92 11/02/2016 04:41 AM    CR 0.97 12/30/2014 07:14 AM    GLU 98 11/02/2016 04:41 AM    GLU 89 12/30/2014 07:14  AM       INR   No results found for this basename: INR:* in the last 24 hours     RADIOLOGY:  Clinical ultrasound demonstrated a hematoma overlying the right common femoral artery with residual to and fro flow in small pseudoaneurysm at the base of the hematoma.    This was embolized in clinic using 200 units of thrombin with no residual flow and patent right proximal common femoral artery, proximal superficial femoral artery, deep femoral artery as well as right deep femoral and femoral veins on completion duplex.      ASSESSMENT & PLAN:  The patient is a 71 year old man with a history of left popliteal artery aneurysm treated endovascularly complicated by subacute stent thrombosis, now status post lysis and endovascular intervention.      He had been started on Eliquis to help maintain stent patency while he recovers from his procedure.  However, he is now developed hematuria related to his Eliquis.  I expressed concern to the patient that as we have to stop Eliquis due to his hematuria, his stent patency will be threatened.  I recommended that we proceed with left femoral to popliteal bypass as he has adequate ipsilateral great saphenous vein to use as conduit and I believe this will provide him better long-term outcomes.  He is agreeable to this.      We will stop his Eliquis at this time.  We have asked our urology colleagues to see the patient for further evaluation of his hematuria and possible cystoscopy to rule out rule out malignancy (after we perform the bypass, he will need to be on Plavix which may preclude further urological workup for his hematuria, and therefore, we would like to have him complete any workup needed prior to his bypass scheduled for November 21, 2016).    I had a pleasant discussion with the patient regarding femoral-popliteal versus femorotibial  bypass. The risks were discussed, including vessel or nerve injury, bleeding, infection, limb ischemia, wound complications, embolization, limb swelling (edema) reaction to medication, or the need for reoperation, were discussed. We also discussed the additional risks of myocardial infarction, anesthetic complications, or other serious complications. All questions were answered. The patient indicated understanding of the procedure, plan, alternatives, and risks, and voiced consent to proceed. The consent was signed and scanned into the electronic medical record.    Luci Bank, MD  Vascular Surgery PGY5  [facility]  PI: [id number]  Vascular Surgery Pager [phone]      Vascular Surgery Attending Note    This patient was seen and evaluated by the resident and a care plan was developed. I provided indirect supervision of the resident and was available to assist with development of the care plan.         Alfredia Ferguson, MD  Division of Vascular Surgery  [facility]  [address]  [place], [place] [address]  Office Phone [phone]  Clinic: [phone]  Pager [phone]

## 2016-11-15 NOTE — Patient Instructions (Signed)
An order has been placed for you to complete imaging with the Radiology Department. Please call Radiology Outpatient Scheduling at [phone] to schedule your appointment.

## 2016-11-15 NOTE — Procedures (Signed)
Procedure Note    Preprocedure diagnosis: Right femoral pseudoaneurysm    Postprocedure diagnosis: Right femoral pseudoaneurysm    Procedure performed: Ultrasound-guided thrombin injection of right femoral pseudoaneurysm    Details of the procedure: The patient was seen at the bedside.  The indications, risks, benefits, and alternatives the procedure were discussed with the patient at the bedside and written and verbal consent was provided to proceed.  The right common femoral pseudoaneurysm was identified with ultrasound.  Preprocedure evaluation of the common femoral artery as well as the proximal deep and superficial femoral arteries was performed to confirm patency.  The patient's right groin was then prepared and draped in the usual sterile fashion.    Using ultrasound visualization, the right femoral pseudoaneurysm was identified.    2 mL of 1% lidocaine was infiltrated into the skin and subcutaneous tissue of the right groin to achieve local anesthesia.  Then, under direct ultrasound visualization, the right femoral pseudoaneurysm was accessed with a micropuncture needle.  Then, under direct color-flow visualization, the pseudoaneurysm was injected with 200 units of thrombin.  After this, no further color flow was noted in the pseudoaneurysm.  A postprocedure duplex was performed, confirming thrombosis of the pseudoaneurysm as well as continued patency of the common femoral arteries and proximal deep and superficial femoral arteries.  The patient tolerated the procedure well.    Findings: Right femoral pseudoaneurysm successfully embolized using 200 units of thrombin with no persistent flow into the pseudoaneurysm on completion duplex.    Right common femoral artery and proximal deep and superficial femoral arteries patent on completion duplex.  Right femoral and deep femoral veins patent on completion duplex.    Luci Bank, MD  Vascular Surgery PGY5  [facility]  PI: [phone]

## 2016-11-15 NOTE — Nursing Note (Signed)
Kenneth Gordon : 71yr old male who presents in clinic today for a consultation.    Patient identified using name and date of birth.  Vitals taken, chief complaint identified, drug allergies verified, screened for pain, medications reviewed, pharmacy confirmed.     Shiana Rappleye A. Rudi Heap, LVN

## 2016-11-15 NOTE — Nursing Note (Signed)
Patient prepped for cystoscopy. Allergies verified: Lidocaine : no, Betadine : no, Latex : no, Cipro :no, Sulfa :  no.  History of bladder cancer  no. Scope used [id number].    Pre-Procedure Checklist - Short Version    ID verified by two sources (select any two from list): DOB and Name  Site bladder  Procedure: Cysto  Surgical/Procedure pause: no    Site(s) marked: N/A  Position: N/A  Consent: Available; reviewed by MD  Pre - Sedation H&P: No  History and Physical: Available; reviewed by MD  Other relevant documentation:  N/A  Relevant images:  N/A  Implants: N/A  Special Equipment:  N/A  Blood products matched:  N/A  Other pre-procedural information:  N/A  Patient concurs: Yes  Team present and concurs:  Single provider procedure      Patient identified using name and date of birth.  Vitals taken, chief complaint identified, drug allergies verified, screened for pain, medications reviewed, pharmacy confirmed.   Toni Arthurs, LVN

## 2016-11-15 NOTE — Progress Notes (Addendum)
NEW PATIENT VISIT     Kenneth Gordon is a 71 year old man referred for gross hematuria.    Per Dr. Ardelle Anton note: "He has a hx of left popliteal aneurysm, s/p covered stent placement on 05/01/16; c/b left popliteal stent thrombosis s/p popliteal angioplasty and lysis 10/30/16;  angioplasty, thrombectomy of the popliteal artery, and stenting of the popliteal artery, mechanical thromboembolectomy of the left tibioperoneal and posterior tibial arteries on 10/31/16"  The patient was discharged to home on Eliquis. He is on ASA 81 mg.     Today he was evaluated at the vascular surgery clinic. Findings of swelling of the right groin site. Clinic US performed showing hematoma overlying the right common femoral artery with residual to and flow in small pseudoaneurysm at the base of the hematoma and this was embolized in clinic.    He was instructed to stop the Eliquis for scheduled bypass on 11/21/2016.    Since discharge, he has been having gross hematuria. No blood clots. He feels like he emptying his bladder. No frequency, urgency ,dysuria, urgency. No prior history of gross hematuria, kidney UTI. No personal or family history of bladder, kidney or prostate cancer.    20 pack-year history; currently 1/2 pack/day.    Past Medical History:   Diagnosis Date    Allergic rhinitis     COPD (chronic obstructive pulmonary disease)     Essential hypertension     NO SIGNIFICANT HISTORY (aka NONE)     Psoriasis     Tobacco dependence      PSH:   See above  Current Outpatient Prescriptions on File Prior to Visit   Medication Sig Dispense Refill    Albuterol (PROAIR HFA, PROVENTIL HFA, VENTOLIN HFA) 90 mcg/actuation inhaler Take 2 puffs by inhalation every 4 hours if needed for wheezing. 17 g 1    Aspirin 81 mg Chewable Tablet Take 81 mg by mouth every day.      buPROPion HCl, smoking deter, 150 mg ER 12 hr Tablet Take 1 tablet by mouth 2 times daily. 60 tablet 2    Lisinopril (PRINIVIL, ZESTRIL) 20 mg Tablet Take 1 tablet by  mouth every day. (blood pressure) 90 tablet 1    Mometasone-Formoterol 100-5 mcg/actuation HFA Aerosol Inhaler Take 1 puff by inhalation every 6 hours if needed. 1 inhaler 2    rivaroxaban (XARELTO) 15 mg (42)- 20 mg (9) Tablet Dose Pack Take 1 tablet by mouth twice daily for 21 days then take '20mg'$  by mouth every day. 51 tablet 0    Rivaroxaban (XARELTO) 20 mg Tablet Take 1 tablet by mouth every day 30 tablet 11     No current facility-administered medications on file prior to visit.      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26      Review of Systems:  GENERAL: Denies recent unwanted weight changes, fevers, lesions  INTEGUMENTARY: Denies recent unexplained rashes, eruptions  EYES:Denies double vision, field defects  EARS, NOSE, MOUTH AND THROAT:Denies new oral lesions  RESPIRATORY: Denies new chest pain, wheezing, cough, difficulty breathing  CARDIOVASCULAR: Denies  chest pain or uncontrolled/untreated high blood pressure  ENDOCRINE: Denies thyroid troubles or history of pituitary abnormalities  GASTROINTESTINAL:Denies significant nausea, vomiting, diarrhea, severe constipation  GENITOURINARY: As per HPI  HEMATOLOGIC/LYMPHATIC: Denies easy bruising or bleeding    All other systems were negative.     Physical Exam:  GENERAL: Well developed, well nourished, in no acute distress  CHEST: Normal breathing with equal chest rise. No gynecomastia  ABDOMEN: Abdomen soft and non-tender without masses   BACK: No CVAT  GENITALIA: Testis descended bilaterally, testicular size 16 cc bilaterally,   no varicocele palpable, vas deferens and epididymus palpable bilaterally, Phallus: circumcised  EXTREMITIES: Ecchymosis of suprapubic area and right thigh    SKIN: Genital and lower body skin intact without lesions or rashes  NEURO: No focal deficits  PSYCH: Patient alert and oriented x3. Alert and cooperative; normal mood and affect; normal attention span     CYSTOSCOPIC EXAMINATION: After informed consent, the patient was placed on the examining table in the dorsal lithotomy position and prepped and draped in the usual manner. 10 cc of Xylocaine jelly were used to anesthetize the urethra.     Flexible cystoscope was used to visualize the urethra and the bladder. No trabeculations and sacculations, no evidence of tumor. There was pink-tinged urine. Cytology was sent.     CYSTOSCOPIC FINDINGS: Normal bladder and prostate - prostate vascular, short prostatic urethra and non-obstructive, normal urethra, no bladder tumors seen       I, Kenneth Jordan MD, was present and participated in all portions of  this procedure with our resident.            Assessment:   71 year old current smoker previously on Eliquis and ASA 81 mg with gross hematuria. Scheduled for vascular bypass on 11/21/2016.     -The definition and work up for asymptomatic microscopic hematuria [facility]) (>3RBC/hpf) and gross hematuria was reviewed with the patient.  We reviewed a careful history, physical examination, and laboratory examinations to rule out benign causes of ASM such as infection, menstruation, vigorous exercise, medical renal disease, viral illness, trauma, or recent urological procedures. We discussed that cystoscopy should be performed on all patients aged 70 years and older and should be performed on all patients who present with risk factors for GU malignancies (e.g. irritative voiding symptoms, current or past tobacco use, chemical exposures).  CT urogram is also necessary to evaluate the renal parenchyma to rule out renal mass and includes an excretory phase to evaluate the urothelium of the upper tracts. If the patient has history of persistent asymptomatic microscopic hematuria and has  two consecutive negative annual UA, then no further UA are necessary.  For persistent microhematuria after negative work-up, yearly UA should be conducted and repeat evaluation within 3-5 years should be considered. All of the above was reviewed with the patient  (AUA guidelines 2012)    Plan:  - Cystoscopy was negative today  - CT urogram ordered. He was given radiology's number to schedule  - UA and UCx ordered  - Will call with results, if negative, no scheduled follow-up needed unless gross hematuria persists.  Otherwise, he should have a yearly UA.      Electronically Signed on 11/15/2016 at 14:48 by:  Estrella Deeds, M.D.  [employer], [place]  Urology PGY-5  Pager #: [phone]  PI #: [phone]  Urology Service Pager #: [phone]      I, Kenneth Jordan MD, saw and examined this patient with our resident. I  reviewed the history and developed the assessment and plan and am in full agreement with this note.

## 2016-11-15 NOTE — Nursing Note (Signed)
Vital signs taken, allergies verified, screened for pain,Frenchie Pribyl Cooks, MA II

## 2016-11-18 ENCOUNTER — Encounter: Payer: Self-pay | Admitting: Internal Medicine

## 2016-11-18 NOTE — Pre-Op/Pre-Procedure Screening (Signed)
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 Presented to vascular clinic with left leg claudication and ultrasound evidence of occluded stent.      HOSPITAL COURSE/PROBLEM LIST AND MANAGEMENT:   Mr. MiGarisresented to UCBalmenter for planned angiography and intervention in the evening on 10/29/16.     The following morning patient was taken to the cath lab and had balloon angioplasty of the popliteal artery with mechanical thrombectomy lysis catheter placement. Patient then went to ICU with continued tPA infusion into the remaining clot burden of his left leg along with heparin infusing into the R groin access site.     Patient returned to cath lab for balloon angioplasty of the left popliteal artery, stent grafting of the left popliteal artery, aspiration thromboembolectomy of the left tibioperoneal and posterior tibial arteries. He also had mechanical thromboembolectomy of the left tibioperoneal andposterior tibial arteries.     He returned to the ICU and continued to receive heparin infusion through access site. The following morning patient had R groin sheath pulled. Patient had some slight bleeding at his access site- Duplex of R groin did not demonstrate pseudoaneurysm. Patient stopped heparin and is being discharged today on Xarelto for 1 month. Patient will call Kenneth Gordon  lobby for insurance coverage.  ========================================  11/15/2016 Urology consult - Dr ClHart Robinsons Dr MaLearta Codding  Today he was evaluated at the vascular surgery clinic. Findings of swelling of the right groin site. Clinic [place] showing hematoma overlying the right common femoral artery with residual to and flow in small pseudoaneurysm at the base of the hematoma and this was embolized in clinic.    He was instructed to stop the Eliquis for scheduled bypass on 11/21/2016.    Since discharge, he has been having gross hematuria. No blood clots. He feels like he emptying his bladder. No frequency, urgency ,dysuria, urgency. No prior history of gross hematuria, kidney UTI. No personal or family history of bladder, kidney or prostate cancer.    20 pack-year history; currently 1/2 pack/day.    - Cystoscopy was negative today  - CT urogram ordered. 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     Overview Note:     Left      Tobacco dependency 04/09/2013    GERD (gastroesophageal reflux disease) 04/09/2013    PND (post-nasal drip) 04/09/2013       PMH:  Past Medical History:   Diagnosis Date    Allergic rhinitis     Aneurysm of left popliteal artery 6cm     04/2016 stent in left popliteal artery-> 10/2016 thrombectomy/stent graft of left posterior tib  artery    COPD (chronic obstructive pulmonary disease)  Essential hypertension     Hematuria     10/2016 -> neg cystoscopy ;  on xarelto x 1 month after angioplasty/stent graft of left popliteal artery, thromboembolectomy of tibioperoneal/post tibial arteries    PAD (peripheral artery disease)     Psoriasis     Tobacco dependence        PSH:  Past Surgical History:   Procedure Laterality Date    IR ABDOMINAL AORTAGRAM  05/06/2016    Aortogram with bilateral iliofemoral runoff; LLE angio ; self-expanding stents (Viabahn 7 x 100  mm and 9 x 50  mm) in LEFT popliteal artery & Post-stenting angioplasty    IR ANGIOPLASTY PERIPHERAL ARTERY Left 10/31/2016    10/31/2016  Angioplasty /stent graft of left posterior tibial artery;thrombectomy of left tibio perioneal /post tibial arteries for Thrombosed stent graft in left popliteal artery aneurysm     Anesthetic Hx:     57Y M and longtime tobacco smoker with recently diagnosed HTN, severe COPD (not currently on home O2), and 6 cm LEFT popliteal artery aneurysm presenting for elective angiography and stenting/repair.    The patient denied having any prior surgical or anesthetic history. He denied having any known family history of anesthetic complications.    Per review of the notes, he experienced DOE walking 100 ft previously. Today, he was seen breathing comfortably with the [facility] elevated without any oxygen requirements, speaking in full complete sentences. He states that he feels better.    The risks and benefits of MAC (including oversedation) and general anesthesia were discussed with the patient, to which he expressed understanding.   Airway   Mallampati: II  TM distance: >3 FB   Neck ROM is full. Dental    (+) edentulous   Pulmonary   (+) COPD (severe), shortness of breath, decreased breath sounds, wheezes  (-) pneumonia, recent URI, sleep apnea    Pulmonary PE comment: Expiratory wheezes with prolonged expiratory phase R>L Cardiovascular   (+) hypertension (),  DOE,   (-) pacemaker, valvular problems/murmurs, past MI, CAD, CABG/stent, hypertriglyceridemia, dysrhythmias, CHF, orthopnea    Rhythm: regular  Rate: normal  Patient has poor exercise tolerance.   Neuro/Psych - negative for neuro conditions with ROS   (+) no chronic back pain  (-) no seizures, CVA  Patient has no psychiatric history. GI/Hepatic/Renal - negative for GI hepatic or renal condions with ROS  (-) obese  (-) chronic renal disease, GERD and liver disease   Endo/Other - negative for endocrine conditions with ROS    (-)  diabetes mellitus,     ASA 3   MAC   intravenous induction       =============  10/31/2016 Thrombosed stent graft in left popliteal artery aneurysm.  PROCEDURE:  1.  Exchange of catheter during thrombolytic therapy.  2.  Catheterization of the left posterior tibial artery, first order  selective.  3.  Balloon angioplasty of the left popliteal artery.  4.  Stent grafting of the left popliteal artery.  5.  Aspiration thromboembolectomy of the left tibioperoneal and  posterior tibial arteries.  6.  Mechanical thromboembolectomy of the left tibioperoneal and  posterior tibial arteries.  7.  Bilateral lower extremity arteriography.  8.  Radiologic supervision and interpretation.  9.  Conscious sedation with continuous hemodynamic monitoring.    ANESTHESIA: Local with sedation.    MEDICATION SUMMARY:    1) Fentanyl 200 mcg IV, 2) midazolam 4 mg IV, 3) heparin 6000 units  IV, 4) nitroglycerin 300 mcg intra-arterially.  =============  Allergies: No Known Allergies    Medications   No current facility-administered medications for this encounter.      Current Outpatient Prescriptions   Medication Sig Dispense Refill    Albuterol (PROAIR HFA, PROVENTIL HFA, VENTOLIN HFA) 90 mcg/actuation inhaler Take 2 puffs by inhalation every 4 hours if needed for wheezing. 17 g 1    Aspirin 81 mg Chewable Tablet Take 81 mg by mouth every day.      buPROPion HCl, smoking deter, 150 mg ER 12 hr Tablet Take 1 tablet by mouth 2 times daily. 60 tablet 2    Lisinopril (PRINIVIL, ZESTRIL) 20 mg Tablet Take 1 tablet by mouth every day. (blood pressure) 90 tablet 1    Mometasone-Formoterol 100-5 mcg/actuation HFA Aerosol Inhaler Take 1 puff by inhalation every 6 hours if needed. 1 inhaler 2    rivaroxaban (XARELTO) 15 mg (42)- 20 mg (9) Tablet Dose Pack Take 1 tablet by mouth twice daily for 21 days then take 3m by mouth every day. 51 tablet 0    Rivaroxaban (XARELTO) 20 mg Tablet Take 1 tablet by mouth every day 30 tablet 11                     Drug/Alcohol Hx:  History   Smoking Status    Current Every Day Smoker    Packs/day: 0.50    Years: 30.00    Types: Cigarettes   Smokeless Tobacco    Never Used     Comment: needs  counseling     History   Alcohol Use No     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26      History   Drug Use No     EXAM DATE: 04/18/2016 12:49 PM  EXAM: DX CHEST 2 VIEWS  INDICATION:  Smoker. Dyspnea on exertion  COMPARISON: None.  FINDINGS: PA and lateral views. Multiple films were needed to fully include the lungs. Bilateral nipple shadows. The lungs appear clear. The lungs are hyperinflated. Heart size normal. Mild atheromatous changes of the aorta. Mild degenerative changes in the spine.    IMPRESSION: PULMONARY HYPERINFLATION. OTHERWISE NO ACTIVE CARDIOPULMONARY DISEASE    CV Tests:     Labs:   CBC:     Lab Results  Component Value Date/Time   WHITE BLOOD CELL COUNT 8.2 12/30/2014 0714   White Blood Cell Count 9.7 11/02/2016 0441   RED CELL COUNT 5.29 12/30/2014 0714   Red Blood Cell Count 4.70 11/02/2016 0441   HEMOGLOBIN 16.5 12/30/2014 0714   Hemoglobin 14.1 11/02/2016 0441   HEMATOCRIT 49.6 12/30/2014 0714   Hematocrit 42.6 11/02/2016 0441   MCV 90.6 11/02/2016 0441   MCV 93.8 12/30/2014 0714   MCH 30.0 11/02/2016 0441   MCH 31.2 12/30/2014 0714   RDW 15.3 (H) 11/02/2016 0441   RDW 13.9 12/30/2014 0714   MPV 8.0 11/02/2016 0441   PLATELET COUNT 197 12/30/2014 0714   Platelet Count 157 11/02/2016 0441       BMP/CMP:    Lab Results  Component Value Date/Time   SODIUM 139 12/30/2014 0714   Sodium 137 11/02/2016 0441   POTASSIUM 4.3 12/30/2014 0714   Potassium 4.1 11/02/2016 0441   CHLORIDE 100 12/30/2014 0714   Chloride 104 11/02/2016 0441   CARBON DIOXIDE TOTAL 30 12/30/2014 0714   Carbon Dioxide Total 26 11/02/2016 0441   CREATININE BLOOD 0.97 12/30/2014 0714   Creatinine Serum 0.92 11/02/2016 0441   E-GFR, AFRICAN AMERICAN >60 12/30/2014 0714   E-GFR,  African American >60 11/02/2016 0441   E-GFR, NON-AFRICAN  AMERICAN >60 12/30/2014 0714   E-GFR, Non-African American >60 11/02/2016 0441   UREA NITROGEN, BLOOD (BUN) 17 12/30/2014 0714   Urea Nitrogen, Blood (BUN) 14 11/02/2016 0441   GLUCOSE 89 12/30/2014 0714   Glucose 98 11/02/2016 0441   CALCIUM 8.8 12/30/2014 0714   Calcium 8.8 11/02/2016 0441       Lab Results  Component Value Date/Time   PROTEIN 6.5 12/30/2014 0714   Protein 6.6 04/26/2016 1202   ALBUMIN 3.8 12/30/2014 0714   Albumin 3.3 04/26/2016 1202   ALKALINE PHOSPHATASE (ALP) 49 12/30/2014 0714   Alkaline Phosphatase (ALP) 59 04/26/2016 1202   ASPARTATE TRANSAMINASE (AST) 20 12/30/2014 0714   Aspartate Transaminase (AST) 22 04/26/2016 1202   BILIRUBIN TOTAL 0.9 12/30/2014 0714   Bilirubin Total 0.6 04/26/2016 1202   ALANINE TRANSFERASE (ALT) 16 12/30/2014 0714   Alanine Transferase (ALT) 24 04/26/2016 1202       HgBA1C:   No results found for: HGBA1C    Coag Panel:    Lab Results  Component Value Date/Time   aPTT 26.5 11/01/2016 1155   INR 1.07 11/01/2016 1155       Thyroid Panel:    Lab Results  Component Value Date/Time   THYROID STIMULATING HORMONE 3.73 (H) 12/30/2014 0714   THYROXINE, FREE (FREE T4) 0.91 12/30/2014 0714       Type and Screen:     Lab Results  Component Value Date/Time   Patient Blood Type A Positive 05/01/2016 0708        Vitals:  Temp: 36.6 C (97.9 F) (11/15/2016  1:54 PM)  Temp src: Oral (11/15/2016  1:54 PM)  Pulse: 91 (11/15/2016  1:54 PM)  BP: 116/72 (11/15/2016  1:54 PM)  Resp: 23 (11/15/2016 10:43 AM)  SpO2: 95 % (11/02/2016  1:31 PM)  Height: 1.88 m (_0 ) (10/29/2016  4:33 PM)  Weight: 79.2 kg (174 lb 9.7 oz) (11/15/2016 10:43 AM)      No LMP for male patient.       Dion Body, MD

## 2016-11-19 ENCOUNTER — Encounter: Payer: Self-pay | Admitting: Urology

## 2016-11-19 ENCOUNTER — Ambulatory Visit
Admission: RE | Admit: 2016-11-19 | Discharge: 2016-11-19 | Disposition: A | Discharge status: Home / Self Care | Payer: Medicare Other | Source: Ambulatory Visit | Attending: Urology | Admitting: Urology

## 2016-11-19 DIAGNOSIS — R310 Gross hematuria: Principal | ICD-10-CM | POA: Insufficient documentation

## 2016-11-19 MED ORDER — IOHEXOL 350 MG IODINE/ML INTRAVENOUS SOLUTION
175.0000 mL | INTRAVENOUS | Status: AC
Start: 2016-11-19 — End: 2016-11-19
  Administered 2016-11-19: 175 mL via INTRAVENOUS

## 2016-11-19 NOTE — Pre-Op/Pre-Procedure Screening (Signed)
.  Pre-Op Phone Call Documentation    Verification of planned surgery times  Surgery Date/Time Verified: Yes  Arrival Time Verified: 0815 (11/21/2016)  Surgery Location Verified: [facility]    Verification of NPO Status  Verified NPO Clear Liquids Time: 0615  Verified NPO Clear Liquids Date: 11/21/16  Verified NPO Solids Time: 0000  Verified NPO Solids Date: 11/21/16     Post surgical discharge information   Person to pick up pt (if different than support person): son Alain Micallef  Phone number of person picking up patient: [phone]    Marletta Lor, RN  11/20/1814:08

## 2016-11-20 ENCOUNTER — Ambulatory Visit: Payer: Medicare Other | Attending: Vascular Surgery | Admitting: Vascular Surgery

## 2016-11-20 VITALS — BP 165/92 | HR 80 | Temp 97.6°F | Wt 174.0 lb

## 2016-11-20 DIAGNOSIS — I724 Aneurysm of artery of lower extremity: Principal | ICD-10-CM | POA: Insufficient documentation

## 2016-11-20 DIAGNOSIS — Z95828 Presence of other vascular implants and grafts: Secondary | ICD-10-CM | POA: Insufficient documentation

## 2016-11-20 LAB — CYTOLOGY NON-GYN: GROSS DESCRIPTION: 50

## 2016-11-20 MED ORDER — ENOXAPARIN 120 MG/0.8 ML SUBCUTANEOUS SYRINGE
INJECTION | SUBCUTANEOUS | 0 refills | Status: DC
Start: 2016-11-20 — End: 2016-12-06

## 2016-11-20 NOTE — Nursing Note (Signed)
Education for administration of Lovenox SQ was done.  RN observed patient how to use Lovenox SQ and verbalizes understanding.  All questions and concerns were addressed to his satisfaction.     Pearlean Brownieeresita Elanna Bert, RN

## 2016-11-20 NOTE — Nursing Note (Signed)
Vital signs taken, allergies verified, screened for pain,Bennye Nix Cooks, MA II

## 2016-11-21 ENCOUNTER — Encounter: Admission: RE | Payer: Self-pay | Source: Ambulatory Visit

## 2016-11-21 ENCOUNTER — Inpatient Hospital Stay: Admission: RE | Admit: 2016-11-21 | Payer: Medicare Other | Source: Ambulatory Visit | Admitting: Vascular Surgery

## 2016-11-21 HISTORY — DX: Peripheral vascular disease, unspecified: I73.9

## 2016-11-21 HISTORY — DX: Hematuria, unspecified: R31.9

## 2016-11-21 HISTORY — DX: Aneurysm of artery of lower extremity: I72.4

## 2016-11-21 SURGERY — CREATION, BYPASS, ARTERIAL, FEMORAL TO POPLITEAL, USING GRAFT
Anesthesia: General | Laterality: Left

## 2016-11-22 ENCOUNTER — Telehealth: Payer: Self-pay | Admitting: Pharmacist

## 2016-11-22 ENCOUNTER — Ambulatory Visit: Payer: Self-pay | Admitting: Pharmacist

## 2016-11-22 NOTE — Telephone Encounter (Signed)
Dr. Waymon BudgeMell,     This is to notify you that we have received the Anticoagulation Clinic referral. Please be aware that you are still responsible for managing your patient's anticoagulation therapy until we establish care. We will notify you when we have established care with your patient and will take over responsibility for management.    **We are no longer able to accept "Indefinite" as the Target End Date for anticoagulation therapy (even if for life long therapy).  Unfortunately EMR is not able to remove this option from the drop down at this time.  Would you like us to update the referral with an end date of 11/20/16 (1 year maximum)?  Please advise.**      NOTE: This referral is good for only 1 year. After 1 year, [facility] compliance requests that you submit an ANTICOAGULATION ANNUAL REFERRAL UPDATE if you wish for this patient to continue being monitored by the Anticoagulation Clinic.     Thank you,     [facility]

## 2016-11-26 ENCOUNTER — Ambulatory Visit: Payer: Medicare Other | Attending: General Practice

## 2016-11-26 ENCOUNTER — Telehealth: Payer: Self-pay | Admitting: Pharmacotherapy

## 2016-11-26 DIAGNOSIS — Z5181 Encounter for therapeutic drug level monitoring: Principal | ICD-10-CM | POA: Insufficient documentation

## 2016-11-26 DIAGNOSIS — I724 Aneurysm of artery of lower extremity: Secondary | ICD-10-CM | POA: Insufficient documentation

## 2016-11-26 DIAGNOSIS — Z7901 Long term (current) use of anticoagulants: Secondary | ICD-10-CM | POA: Insufficient documentation

## 2016-11-26 LAB — POC INR: POC INR: 1.1

## 2016-11-26 MED ORDER — WARFARIN 5 MG TABLET
5.0000 mg | ORAL_TABLET | Freq: Every day | ORAL | 0 refills | Status: DC
Start: 2016-11-26 — End: 2017-01-14

## 2016-11-26 NOTE — Progress Notes (Signed)
Kenneth Gordon returns for follow up after lysis of popliteal stent.  He is currently without symptoms. He has had no further hematuria, and his workup was unrevealing for a significant cause.    Home Meds:  Current Outpatient Prescriptions on File Prior to Visit   Medication Sig Dispense Refill    Albuterol (PROAIR HFA, PROVENTIL HFA, VENTOLIN HFA) 90 mcg/actuation inhaler Take 2 puffs by inhalation every 4 hours if needed for wheezing. 17 g 1    Aspirin 81 mg Chewable Tablet Take 81 mg by mouth every day.      buPROPion HCl, smoking deter, 150 mg ER 12 hr Tablet Take 1 tablet by mouth 2 times daily. 60 tablet 2    chlorpheniramine maleate (CHLOR-TRIMETON PO) Take 1 tablet by mouth if needed.      Lisinopril (PRINIVIL, ZESTRIL) 20 mg Tablet Take 1 tablet by mouth every day. (blood pressure) 90 tablet 1    Mometasone-Formoterol 100-5 mcg/actuation HFA Aerosol Inhaler Take 1 puff by inhalation every 6 hours if needed. 1 inhaler 2     No current facility-administered medications on file prior to visit.        Allergies:  Review of patient's allergies indicates no known allergies.    ROS:  A Complete review of systems was unremarkable, except as listed above in the HPI and PMH.       BP (!) 165/92  Pulse 80  Temp 36.4 C (97.6 F) (Oral)  Wt 78.9 kg (174 lb)  BMI 22.34 kg/m2    PE:  ABD - soft, non-tender, non-distended   EXT - right groin with soft.  Both feet are warm to palpation, well perfused.  RLE: Palpable right femoral, popliteal, DP, and PT pulses  LLE: Palpable left femoral.  Nonpalpable left popliteal, DP, and PT pulses.  Left PT with triphasic Doppler signal, left DP with biphasic Doppler signal. (Unchanged from previous exam).      ASSESSMENT & PLAN:  The patient is a 71 year old man with a history of left popliteal artery aneurysm treated endovascularly complicated by subacute stent thrombosis, now status post lysis and endovascular intervention.      I discussed at length the various  treatment options, as well as the need to continue anti-coagulation regardless of treatment because of the residual thrombus burden.  Risks/benefits of observation vs. Bypass including the relative risk of treatment failure and potential amputation was discussed.  As he is entirely asymptomatic he has chosen observational management at this time.  As such, I will refer him to anti-coagulation clinic for institution of warfarin.  I have asked that he return in three months for surveillance imaging.  I have instructed him to contact me immediately for recurrent symptoms to those that brought him in with the stent occlusion.    Donzetta SprungMatthew Dorethia Jeanmarie, M.D.  Professor and Stage managerChief, Division of Vascular Surgery  [facility]  [address], [address]  [place], [place]  [id number]  [phone]

## 2016-11-26 NOTE — Progress Notes (Signed)
Kenneth Gordon Force here for initial Coumadin Clinic visit in classroom setting. Total time educating the pt was 45 minutes.     Kenneth Gordon was verbally educated and was given handouts. Pt was explained the structure of clinic and signed documents verifying understanding. Pt was educated on importance of drug compliance, side effects of medication such as bleeding and bruising, monitoring parameters and the importance of going to lab on time. Diet including consistency with vitamin K vegetables and avoidance of binge drinking, as well as exercise and potential drug interactions were also reviewed. One on one time was spent with the patient reviewing current medications and potential drug interactions as well as questions pertaining to medications.     The teach-back method was implemented and the patient was able to demonstrate understanding of the above information.     Pt has not started on coumadin.    Kenneth Gordon has been taking lovenox 120mg  daily.   Pt verified understanding of lovenox administration, no further teaching required.     INR 1.1.  Plan: Start 5mg  daily. Follow up in 2 day(s).    Supervising physician: Dr. Cliffton AstersWhite

## 2016-11-26 NOTE — Telephone Encounter (Signed)
This is to notify you that we have established care with Kenneth Gordon. This patient has been educated and we will now assume responsibility for their anticoagulation management until date provided or up to 1 year. After 1 year, [facility] compliance requests that you submit an ANTICOAGULATION ANNUAL REFERRAL UPDATE to continue monitoring by the [facility] yearly.     Thank you,     [facility]

## 2016-11-28 ENCOUNTER — Ambulatory Visit: Payer: Medicare Other | Attending: General Practice

## 2016-11-28 DIAGNOSIS — I724 Aneurysm of artery of lower extremity: Principal | ICD-10-CM | POA: Insufficient documentation

## 2016-11-28 LAB — CBC WITH DIFFERENTIAL
BASOPHILS % AUTO: 0.9 %
BASOPHILS ABS AUTO: 0.1 10*3/uL (ref 0.0–0.2)
EOSINOPHIL % AUTO: 6.5 %
EOSINOPHIL ABS AUTO: 0.5 10*3/uL (ref 0.0–0.5)
HEMATOCRIT: 41.1 % (ref 41.0–53.0)
HEMOGLOBIN: 13.7 g/dL (ref 13.5–17.5)
LYMPHOCYTE ABS AUTO: 1.6 10*3/uL (ref 1.0–4.8)
LYMPHOCYTES % AUTO: 20.9 %
MCH: 30.6 pg (ref 27.0–33.0)
MCHC: 33.3 % (ref 32.0–36.0)
MCV: 91.7 UM3 (ref 80.0–100.0)
MONOCYTES % AUTO: 8.4 %
MONOCYTES ABS AUTO: 0.6 10*3/uL (ref 0.1–0.8)
MPV: 8.1 UM3 (ref 6.8–10.0)
NEUTROPHIL ABS AUTO: 4.9 10*3/uL (ref 1.8–7.7)
NEUTROPHILS % AUTO: 63.3 %
PLATELET COUNT: 251 10*3/uL (ref 130–400)
RDW: 15 % — AB (ref 0.0–14.7)
RED CELL COUNT: 4.48 10*6/uL — AB (ref 4.50–5.90)
WHITE BLOOD CELL COUNT: 7.7 10*3/uL (ref 4.5–11.0)

## 2016-11-28 LAB — INR: INR: 1.09 (ref 0.87–1.18)

## 2016-11-29 ENCOUNTER — Ambulatory Visit: Payer: Self-pay | Admitting: Pharmacist

## 2016-12-02 ENCOUNTER — Ambulatory Visit: Payer: Medicare Other | Attending: General Practice

## 2016-12-02 DIAGNOSIS — I724 Aneurysm of artery of lower extremity: Principal | ICD-10-CM | POA: Insufficient documentation

## 2016-12-02 LAB — INR: INR: 1.29 — ABNORMAL HIGH (ref 0.87–1.18)

## 2016-12-03 ENCOUNTER — Ambulatory Visit: Payer: Self-pay

## 2016-12-05 ENCOUNTER — Ambulatory Visit: Payer: Medicare Other | Attending: General Practice

## 2016-12-05 DIAGNOSIS — I724 Aneurysm of artery of lower extremity: Principal | ICD-10-CM | POA: Insufficient documentation

## 2016-12-05 LAB — INR: INR: 1.5 — AB (ref 0.87–1.18)

## 2016-12-06 ENCOUNTER — Ambulatory Visit: Payer: Self-pay

## 2016-12-06 DIAGNOSIS — I724 Aneurysm of artery of lower extremity: Principal | ICD-10-CM

## 2016-12-06 MED ORDER — ENOXAPARIN 120 MG/0.8 ML SUBCUTANEOUS SYRINGE
120.0000 mg | INJECTION | SUBCUTANEOUS | 0 refills | Status: DC
Start: 2016-12-06 — End: 2017-01-07

## 2016-12-09 ENCOUNTER — Ambulatory Visit: Payer: Medicare Other | Attending: General Practice

## 2016-12-09 DIAGNOSIS — I724 Aneurysm of artery of lower extremity: Principal | ICD-10-CM | POA: Insufficient documentation

## 2016-12-09 LAB — INR: INR: 2.14 — AB (ref 0.87–1.18)

## 2016-12-10 ENCOUNTER — Ambulatory Visit: Payer: Self-pay

## 2016-12-16 ENCOUNTER — Ambulatory Visit: Payer: Medicare Other | Attending: General Practice

## 2016-12-16 DIAGNOSIS — I724 Aneurysm of artery of lower extremity: Principal | ICD-10-CM | POA: Insufficient documentation

## 2016-12-16 LAB — INR: INR: 1.72 — AB (ref 0.87–1.18)

## 2016-12-17 ENCOUNTER — Ambulatory Visit: Payer: Self-pay | Admitting: Pharmacist

## 2016-12-18 ENCOUNTER — Ambulatory Visit: Payer: Medicare Other | Admitting: Family Medicine

## 2016-12-18 ENCOUNTER — Encounter: Payer: Self-pay | Admitting: Family Medicine

## 2016-12-18 VITALS — BP 120/70 | HR 88 | Temp 97.5°F | Resp 22 | Wt 179.2 lb

## 2016-12-18 DIAGNOSIS — J410 Simple chronic bronchitis: Secondary | ICD-10-CM

## 2016-12-18 DIAGNOSIS — I10 Essential (primary) hypertension: Secondary | ICD-10-CM | POA: Insufficient documentation

## 2016-12-18 DIAGNOSIS — J42 Unspecified chronic bronchitis: Secondary | ICD-10-CM | POA: Insufficient documentation

## 2016-12-18 DIAGNOSIS — Z23 Encounter for immunization: Secondary | ICD-10-CM

## 2016-12-18 DIAGNOSIS — F17200 Nicotine dependence, unspecified, uncomplicated: Principal | ICD-10-CM

## 2016-12-18 DIAGNOSIS — I724 Aneurysm of artery of lower extremity: Secondary | ICD-10-CM

## 2016-12-18 MED ORDER — UMECLIDINIUM 62.5 MCG/ACTUATION BLISTER POWDER FOR INHALATION
62.5000 ug | DISK | Freq: Every day | RESPIRATORY_TRACT | 1 refills | Status: DC
Start: 2016-12-18 — End: 2017-01-28

## 2016-12-18 MED ORDER — IPRATROPIUM 20 MCG-ALBUTEROL 100 MCG/ACTUATION MIST FOR INHALATION
1.0000 | Freq: Four times a day (QID) | RESPIRATORY_TRACT | 12 refills | Status: DC
Start: 2016-12-18 — End: 2017-07-29

## 2016-12-18 MED ORDER — FLUTICASONE 100 MCG-VILANTEROL 25 MCG/DOSE BREATH ACTIVATED ORAL INHALER
1.0000 | DISK | Freq: Every day | RESPIRATORY_TRACT | 1 refills | Status: DC
Start: 2016-12-18 — End: 2017-04-01

## 2016-12-18 NOTE — Nursing Note (Signed)
Patient ID confirmed using 2 identifiers.    Vital signs taken, allergies verified, screened for pain, and med history taken.     Mammography screening for females between 50-70 years of age reviewed as appropriate.    Flu Vaccine status reviewed and updated as appropriate.      My Chart status reviewed and updated as appropriate.    I have asked the patient if they have received any medical treatment/consultations outside of the [facility] within the past 30 days.  The patient has indicated no to receiving services outside of the  Health system.  If yes, I have requested information to ascertain these results

## 2016-12-18 NOTE — Nursing Note (Signed)
The Influenza Vaccine VIS document for the flu injection was given to patient to review. Patient or person named in permission has answered no to any history of egg allergy, previous serious reaction to a influenza vaccine or current illness which would preclude them receiving an immunization. Any questions were referred to the physician. The Influenza Vaccine was then administered per protocol. The patient was observed for immediate reactions to the vaccine per protocol. None were observed.   Kenneth Gordon

## 2016-12-18 NOTE — Progress Notes (Signed)
Chief Complaint: Follow up COPD    Kenneth Gordon is a 71yr old male with history of tobacco dependence and COPD who presents for medication follow up. Lives part of the year in [place] and currently uses [place] and Incruse daily, along with Combivent prn. Presents medications he uses which he last filled in [place] for reconciliation. Denies cough, increased sputum production, colored sputum, SOB or DOE.  He continues to smoke, although he states less than before. Smokes between 1/2 to 1 ppd but never less than 10 cigarettes per day.      REVIEW OF SYSTEMS:  Constitutional: negative.  CV: negative.  Resp: sputum.  Integumentary: negative.  Neuro: negative.    I did review patient's past medical and family/social history, no changes noted.    CURRENT MEDICATIONS:  Current Outpatient Prescriptions   Medication Sig Dispense Refill    Albuterol (PROAIR HFA, PROVENTIL HFA, VENTOLIN HFA) 90 mcg/actuation inhaler Take 2 puffs by inhalation every 4 hours if needed for wheezing. 17 g 1    Albuterol/Ipratropium (COMBIVENT RESPIMAT) 20-100 mcg/actuation Inhaler Take 1 puff by inhalation 4 times daily. 4 g 12    Aspirin 81 mg Chewable Tablet Take 81 mg by mouth every day.      chlorpheniramine maleate (CHLOR-TRIMETON PO) Take 1 tablet by mouth if needed.      Enoxaparin (LOVENOX) 120 mg/0.8 mL Syringe Inject 120 mg subcutaneously every 24 hours. 20 syringe 0    Fluticasone/Vilanterol (BREO ELLIPTA) 100-25 mcg/dose Inhaler Take 1 puff by inhalation every day. 60 each 1    Lisinopril (PRINIVIL, ZESTRIL) 20 mg Tablet Take 1 tablet by mouth every day. (blood pressure) 90 tablet 1    Mometasone-Formoterol 100-5 mcg/actuation HFA Aerosol Inhaler Take 1 puff by inhalation every 6 hours if needed. 1 inhaler 2    umeclidinium (INCRUSE ELLIPTA) 62.5 mcg/actuation Disk with Device Take 62.5 mcg by inhalation every day. 30 each 1    Warfarin (COUMADIN) 5 mg Tablet Take 1 tablet by mouth every day. 90 tablet 0     No current  facility-administered medications for this visit.        PHYSICAL EXAM:  BP 120/70  Pulse 88  Temp 36.4 C (97.5 F) (Tympanic)  Resp 22  Wt 81.3 kg (179 lb 3.7 oz)  SpO2 98%  BMI 23.01 kg/m2  General Appearance: healthy, alert, no distress, pleasant affect, cooperative.  Neck:  Neck supple. No adenopathy, thyroid symmetric, normal size.  Heart:  normal rate and regular rhythm, no murmurs, clicks, or gallops.  Lungs: clear to auscultation.  Extremities:  no cyanosis, clubbing, or edema.         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 Patient verbalizes understanding of teaching and instructions.

## 2016-12-19 ENCOUNTER — Ambulatory Visit: Payer: Medicare Other | Attending: General Practice

## 2016-12-19 DIAGNOSIS — I724 Aneurysm of artery of lower extremity: Principal | ICD-10-CM | POA: Insufficient documentation

## 2016-12-19 LAB — INR: INR: 2.4 — AB (ref 0.87–1.18)

## 2016-12-20 ENCOUNTER — Ambulatory Visit: Payer: Self-pay

## 2016-12-23 ENCOUNTER — Ambulatory Visit: Payer: Medicare Other | Attending: General Practice

## 2016-12-23 DIAGNOSIS — I724 Aneurysm of artery of lower extremity: Principal | ICD-10-CM | POA: Insufficient documentation

## 2016-12-23 LAB — INR: INR: 1.68 — AB (ref 0.87–1.18)

## 2016-12-24 ENCOUNTER — Ambulatory Visit: Payer: Self-pay

## 2016-12-26 ENCOUNTER — Ambulatory Visit: Payer: Medicare Other | Attending: General Practice

## 2016-12-26 DIAGNOSIS — I724 Aneurysm of artery of lower extremity: Principal | ICD-10-CM | POA: Insufficient documentation

## 2016-12-26 LAB — INR: INR: 1.99 — AB (ref 0.87–1.18)

## 2016-12-27 ENCOUNTER — Ambulatory Visit: Payer: Self-pay

## 2016-12-30 ENCOUNTER — Ambulatory Visit: Payer: Medicare Other | Attending: General Practice

## 2016-12-30 DIAGNOSIS — I724 Aneurysm of artery of lower extremity: Principal | ICD-10-CM | POA: Insufficient documentation

## 2016-12-30 LAB — INR: INR: 1.73 — ABNORMAL HIGH (ref 0.87–1.18)

## 2016-12-31 ENCOUNTER — Ambulatory Visit: Payer: Self-pay | Admitting: Pharmacotherapy

## 2017-01-01 ENCOUNTER — Encounter: Payer: Self-pay | Admitting: Urology

## 2017-01-02 ENCOUNTER — Ambulatory Visit: Payer: Medicare Other | Attending: General Practice

## 2017-01-02 DIAGNOSIS — I724 Aneurysm of artery of lower extremity: Principal | ICD-10-CM | POA: Insufficient documentation

## 2017-01-02 LAB — INR: INR: 2.53 — AB (ref 0.87–1.18)

## 2017-01-03 ENCOUNTER — Ambulatory Visit: Payer: Self-pay

## 2017-01-06 ENCOUNTER — Ambulatory Visit: Payer: Medicare Other | Attending: General Practice

## 2017-01-06 DIAGNOSIS — I724 Aneurysm of artery of lower extremity: Principal | ICD-10-CM | POA: Insufficient documentation

## 2017-01-06 LAB — INR: INR: 1.63 — AB (ref 0.87–1.18)

## 2017-01-07 ENCOUNTER — Ambulatory Visit: Payer: Self-pay

## 2017-01-07 ENCOUNTER — Other Ambulatory Visit: Payer: Self-pay

## 2017-01-07 DIAGNOSIS — I724 Aneurysm of artery of lower extremity: Principal | ICD-10-CM

## 2017-01-07 MED ORDER — ENOXAPARIN 120 MG/0.8 ML SUBCUTANEOUS SYRINGE
120.0000 mg | INJECTION | SUBCUTANEOUS | 0 refills | Status: DC
Start: 2017-01-07 — End: 2017-04-02

## 2017-01-09 ENCOUNTER — Ambulatory Visit: Payer: Medicare Other | Attending: General Practice

## 2017-01-09 DIAGNOSIS — I724 Aneurysm of artery of lower extremity: Principal | ICD-10-CM | POA: Insufficient documentation

## 2017-01-09 LAB — INR: INR: 2.42 — ABNORMAL HIGH (ref 0.87–1.18)

## 2017-01-10 ENCOUNTER — Ambulatory Visit: Payer: Self-pay

## 2017-01-10 DIAGNOSIS — I724 Aneurysm of artery of lower extremity: Principal | ICD-10-CM

## 2017-01-13 ENCOUNTER — Ambulatory Visit: Payer: Medicare Other | Attending: General Practice

## 2017-01-13 DIAGNOSIS — I724 Aneurysm of artery of lower extremity: Principal | ICD-10-CM | POA: Insufficient documentation

## 2017-01-13 LAB — INR: INR: 2.22 — AB (ref 0.87–1.18)

## 2017-01-14 ENCOUNTER — Ambulatory Visit: Payer: Self-pay

## 2017-01-14 ENCOUNTER — Other Ambulatory Visit: Payer: Self-pay | Admitting: Pharmacotherapy

## 2017-01-14 DIAGNOSIS — I724 Aneurysm of artery of lower extremity: Secondary | ICD-10-CM

## 2017-01-14 MED ORDER — WARFARIN 5 MG TABLET
10.0000 mg | ORAL_TABLET | Freq: Every day | ORAL | 3 refills | Status: DC
Start: 2017-01-14 — End: 2017-11-28

## 2017-01-17 ENCOUNTER — Ambulatory Visit: Payer: Medicare Other | Attending: General Practice

## 2017-01-17 DIAGNOSIS — I724 Aneurysm of artery of lower extremity: Principal | ICD-10-CM | POA: Insufficient documentation

## 2017-01-17 LAB — INR: INR: 2.89 — AB (ref 0.87–1.18)

## 2017-01-18 ENCOUNTER — Ambulatory Visit: Payer: Self-pay

## 2017-01-18 DIAGNOSIS — I724 Aneurysm of artery of lower extremity: Principal | ICD-10-CM

## 2017-01-20 ENCOUNTER — Ambulatory Visit: Payer: Medicare Other | Attending: General Practice

## 2017-01-20 DIAGNOSIS — I724 Aneurysm of artery of lower extremity: Principal | ICD-10-CM | POA: Insufficient documentation

## 2017-01-20 LAB — INR: INR: 2.51 — ABNORMAL HIGH (ref 0.87–1.18)

## 2017-01-21 ENCOUNTER — Ambulatory Visit: Payer: Self-pay

## 2017-01-21 DIAGNOSIS — I724 Aneurysm of artery of lower extremity: Secondary | ICD-10-CM

## 2017-01-23 ENCOUNTER — Ambulatory Visit: Payer: Medicare Other | Attending: General Practice

## 2017-01-23 DIAGNOSIS — I724 Aneurysm of artery of lower extremity: Principal | ICD-10-CM | POA: Insufficient documentation

## 2017-01-23 LAB — INR: INR: 1.79 — AB (ref 0.87–1.18)

## 2017-01-24 ENCOUNTER — Ambulatory Visit: Payer: Self-pay

## 2017-01-24 DIAGNOSIS — I724 Aneurysm of artery of lower extremity: Principal | ICD-10-CM

## 2017-01-28 ENCOUNTER — Encounter: Payer: Self-pay | Admitting: Family Medicine

## 2017-01-28 DIAGNOSIS — J410 Simple chronic bronchitis: Principal | ICD-10-CM

## 2017-01-28 MED ORDER — UMECLIDINIUM 62.5 MCG/ACTUATION BLISTER POWDER FOR INHALATION
62.5000 ug | DISK | Freq: Every day | RESPIRATORY_TRACT | 1 refills | Status: DC
Start: 2017-01-28 — End: 2017-07-15

## 2017-01-28 MED ORDER — LISINOPRIL 20 MG TABLET
20.0000 mg | ORAL_TABLET | Freq: Every day | ORAL | 0 refills | Status: DC
Start: 2017-01-28 — End: 2017-05-09

## 2017-01-28 NOTE — Telephone Encounter (Signed)
Last refill on umeclidinium was 12/18/16, last refill on lisinopril was 05/21/16. Last ov 12/18/16.

## 2017-01-30 ENCOUNTER — Ambulatory Visit: Payer: Medicare Other | Attending: General Practice

## 2017-01-30 DIAGNOSIS — I724 Aneurysm of artery of lower extremity: Principal | ICD-10-CM | POA: Insufficient documentation

## 2017-01-30 LAB — INR: INR: 3.28 — ABNORMAL HIGH (ref 0.87–1.18)

## 2017-01-31 ENCOUNTER — Ambulatory Visit: Payer: Self-pay

## 2017-01-31 DIAGNOSIS — I724 Aneurysm of artery of lower extremity: Principal | ICD-10-CM

## 2017-02-06 ENCOUNTER — Ambulatory Visit: Payer: Medicare Other | Attending: General Practice

## 2017-02-06 DIAGNOSIS — I724 Aneurysm of artery of lower extremity: Principal | ICD-10-CM | POA: Insufficient documentation

## 2017-02-06 LAB — INR: INR: 2.85 — AB (ref 0.87–1.18)

## 2017-02-07 ENCOUNTER — Encounter: Payer: Self-pay | Admitting: Family Medicine

## 2017-02-07 ENCOUNTER — Ambulatory Visit: Payer: Self-pay | Admitting: Pharmacotherapy

## 2017-02-07 DIAGNOSIS — J410 Simple chronic bronchitis: Principal | ICD-10-CM

## 2017-02-07 DIAGNOSIS — I724 Aneurysm of artery of lower extremity: Principal | ICD-10-CM

## 2017-02-17 ENCOUNTER — Ambulatory Visit: Payer: Medicare Other | Attending: General Practice

## 2017-02-17 DIAGNOSIS — I724 Aneurysm of artery of lower extremity: Principal | ICD-10-CM | POA: Insufficient documentation

## 2017-02-17 LAB — INR: INR: 4.37 — ABNORMAL HIGH (ref 0.87–1.18)

## 2017-02-18 ENCOUNTER — Ambulatory Visit: Payer: Self-pay

## 2017-02-18 DIAGNOSIS — I724 Aneurysm of artery of lower extremity: Principal | ICD-10-CM

## 2017-02-20 ENCOUNTER — Ambulatory Visit: Payer: Medicare Other | Attending: General Practice

## 2017-02-20 DIAGNOSIS — I724 Aneurysm of artery of lower extremity: Principal | ICD-10-CM | POA: Insufficient documentation

## 2017-02-20 LAB — INR: INR: 1.93 — ABNORMAL HIGH (ref 0.87–1.18)

## 2017-02-21 ENCOUNTER — Ambulatory Visit: Payer: Self-pay | Admitting: Pharmacist

## 2017-02-21 DIAGNOSIS — I724 Aneurysm of artery of lower extremity: Principal | ICD-10-CM

## 2017-02-27 ENCOUNTER — Ambulatory Visit: Payer: Medicare Other | Attending: General Practice

## 2017-02-27 DIAGNOSIS — I724 Aneurysm of artery of lower extremity: Principal | ICD-10-CM | POA: Insufficient documentation

## 2017-02-27 LAB — INR: INR: 3.48 — AB (ref 0.87–1.18)

## 2017-02-28 ENCOUNTER — Ambulatory Visit: Payer: Self-pay | Admitting: Pharmacist

## 2017-02-28 DIAGNOSIS — I724 Aneurysm of artery of lower extremity: Principal | ICD-10-CM

## 2017-03-06 ENCOUNTER — Ambulatory Visit: Payer: Self-pay | Admitting: Pharmacist

## 2017-03-06 ENCOUNTER — Ambulatory Visit: Payer: Medicare Other | Attending: General Practice

## 2017-03-06 DIAGNOSIS — I724 Aneurysm of artery of lower extremity: Principal | ICD-10-CM | POA: Insufficient documentation

## 2017-03-06 LAB — INR: INR: 2.35 — AB (ref 0.87–1.18)

## 2017-03-13 ENCOUNTER — Ambulatory Visit: Payer: Medicare Other | Attending: General Practice

## 2017-03-13 DIAGNOSIS — I724 Aneurysm of artery of lower extremity: Principal | ICD-10-CM | POA: Insufficient documentation

## 2017-03-13 LAB — INR: INR: 2.59 — ABNORMAL HIGH (ref 0.87–1.18)

## 2017-03-14 ENCOUNTER — Ambulatory Visit: Payer: Self-pay | Admitting: Pharmacist

## 2017-03-14 DIAGNOSIS — I724 Aneurysm of artery of lower extremity: Principal | ICD-10-CM

## 2017-03-20 ENCOUNTER — Ambulatory Visit: Payer: Medicare Other | Attending: General Practice

## 2017-03-20 DIAGNOSIS — I724 Aneurysm of artery of lower extremity: Principal | ICD-10-CM | POA: Insufficient documentation

## 2017-03-20 LAB — INR: INR: 3.06 — AB (ref 0.87–1.18)

## 2017-03-21 ENCOUNTER — Ambulatory Visit: Payer: Self-pay | Admitting: Pharmacist

## 2017-03-21 DIAGNOSIS — I724 Aneurysm of artery of lower extremity: Principal | ICD-10-CM

## 2017-03-27 ENCOUNTER — Ambulatory Visit: Payer: Medicare Other | Attending: General Practice

## 2017-03-27 DIAGNOSIS — I724 Aneurysm of artery of lower extremity: Principal | ICD-10-CM | POA: Insufficient documentation

## 2017-03-27 LAB — INR: INR: 2.24 — AB (ref 0.87–1.18)

## 2017-03-28 ENCOUNTER — Ambulatory Visit: Payer: Self-pay | Admitting: Pharmacist

## 2017-03-28 DIAGNOSIS — I724 Aneurysm of artery of lower extremity: Principal | ICD-10-CM

## 2017-03-31 ENCOUNTER — Encounter: Payer: Self-pay | Admitting: Family Medicine

## 2017-03-31 NOTE — Telephone Encounter (Signed)
From: Kenneth Gordon  To: Kenneth JacksonActon, Kenneth Paul, DO  Sent: 03/31/2017 3:14 PM PST  Subject: Preventive Care    Hi    I am writing as my COPD is getting worse. I an unable to sleep for more than an hour or two at night were I get up for an hour or two and go back to sleep. Iam using the Albuterol/Ipratropium 20-100 mcg/actuation Inhaler a lot more than the 4 times daily as prescribed.     My Ex who is an Charity fundraiserN suggest that I ask about using an nebuliser with Ipratropium Bromide and Albutrtol Sulfate inhalation Solution. Would this be a good course of action or should I make an appointment to see you.    Joanna Puffobert Tuckerman

## 2017-03-31 NOTE — Telephone Encounter (Signed)
From: [name]  To: [name], [name], DO  Sent: 03/31/2017 3:13 PM PST  Subject: Preventive Care    Hi    I am writing as my COPD is getting worse. I an unable to sleep for more than an hour or two at night were I get up for an hour or two and go back to sleep. Iam using the Albuterol/Ipratropium 20-100 mcg/actuation Inhaler a lot more than the 4 times daily as prescribed.     My Ex who is an Charity fundraiserN suggest that I ask about using an nebuliser with Ipratropium Bromide and Albutrtol Sulfate inhalation Solution. Would this be a good course of action or should I make an appointment to see you.    Kenneth Gordon

## 2017-04-01 ENCOUNTER — Other Ambulatory Visit: Payer: Self-pay | Admitting: Family Medicine

## 2017-04-01 DIAGNOSIS — J410 Simple chronic bronchitis: Principal | ICD-10-CM

## 2017-04-01 NOTE — Telephone Encounter (Signed)
Last refilled 12-18-16 . Last office visit 12-18-16

## 2017-04-02 ENCOUNTER — Ambulatory Visit
Admission: RE | Admit: 2017-04-02 | Discharge: 2017-04-02 | Disposition: A | Discharge status: Home / Self Care | Payer: Medicare Other | Source: Ambulatory Visit | Attending: Family Medicine | Admitting: Family Medicine

## 2017-04-02 ENCOUNTER — Ambulatory Visit (INDEPENDENT_AMBULATORY_CARE_PROVIDER_SITE_OTHER): Payer: Medicare Other | Admitting: Family Medicine

## 2017-04-02 VITALS — BP 110/80 | HR 88 | Temp 97.1°F | Resp 24 | Wt 187.4 lb

## 2017-04-02 DIAGNOSIS — R0602 Shortness of breath: Principal | ICD-10-CM

## 2017-04-02 DIAGNOSIS — J410 Simple chronic bronchitis: Secondary | ICD-10-CM

## 2017-04-02 DIAGNOSIS — F17200 Nicotine dependence, unspecified, uncomplicated: Secondary | ICD-10-CM

## 2017-04-02 MED ORDER — ALBUTEROL SULFATE 1.25 MG/3 ML SOLUTION FOR NEBULIZATION
1.2500 mg | INHALATION_SOLUTION | RESPIRATORY_TRACT | 3 refills | Status: DC | PRN
Start: 2017-04-02 — End: 2017-04-23

## 2017-04-02 MED ORDER — PREDNISONE 20 MG TABLET
60.0000 mg | ORAL_TABLET | Freq: Every day | ORAL | 0 refills | Status: AC
Start: 2017-04-02 — End: 2017-04-07

## 2017-04-02 NOTE — Nursing Note (Signed)
Patient ID confirmed using 2 identifiers.    Vital signs taken, allergies verified, screened for pain, and med history taken.     Mammography screening for females between 50-71 years of age reviewed as appropriate.    Flu Vaccine status reviewed and updated as appropriate.      My Chart status reviewed and updated as appropriate.    I have asked the patient if they have received any medical treatment/consultations outside of the [facility] within the past 30 days.  The patient has indicated no to receiving services outside of the [facility].  If yes, I have requested information to ascertain these results

## 2017-04-02 NOTE — Progress Notes (Signed)
Chief Complaint: Shortness of breath    Kenneth HamperRobert G Gordon is a 72yr old male with history of COPD and tobacco dependence who presents with 2 months of increased work of breathing intermittently throughout the day. He will wake in the morning and feel "a weight" in his chest that will gradually improve but not completely go away.  No recent illness. Denies fever, chills, congestion. No change to color or amount of morning sputum. No hemoptysis. Notes "I had a hard time" when [place]  Using [place], Incruse and Combivent prn regularly. No nebulizer in use right now. Has access to a machine but no medication. Continues to smoke up to 10 cigarettes daily.      REVIEW OF SYSTEMS:  Constitutional: negative.  Ears, Nose, Mouth, Throat: negative.  CV: negative.  Resp: sputum, shortness of breath, dyspnea on exertion.    I did review patient's past medical and family/social history, no changes noted.    CURRENT MEDICATIONS:  Current Outpatient Medications   Medication Sig Dispense Refill    Albuterol (PROAIR HFA, PROVENTIL HFA, VENTOLIN HFA) 90 mcg/actuation inhaler Take 2 puffs by inhalation every 4 hours if needed for wheezing. 17 g 1    Albuterol/Ipratropium (COMBIVENT RESPIMAT) 20-100 mcg/actuation Inhaler Take 1 puff by inhalation 4 times daily. 4 g 12    Aspirin 81 mg Chewable Tablet Take 81 mg by mouth every day.      BREO ELLIPTA 100-25 mcg/dose Inhaler INHALE 1 PUFF BY MOUTH EVERY DAY 60 each 0    chlorpheniramine maleate (CHLOR-TRIMETON PO) Take 1 tablet by mouth if needed.      Enoxaparin (LOVENOX) 120 mg/0.8 mL Syringe Inject 120 mg subcutaneously every 24 hours. 10 syringe 0    Lisinopril (PRINIVIL, ZESTRIL) 20 mg Tablet Take 1 tablet by mouth every day. (blood pressure) 90 tablet 0    Mometasone-Formoterol 100-5 mcg/actuation HFA Aerosol Inhaler Take 1 puff by inhalation every 6 hours if needed. 1 inhaler 2    umeclidinium (INCRUSE ELLIPTA) 62.5 mcg/actuation Disk with  Device Take 62.5 mcg by inhalation every day. 30 each 1    Warfarin (COUMADIN) 5 mg Tablet Take 2 tablets by mouth every day. 180 tablet 3     No current facility-administered medications for this visit.        PHYSICAL EXAM:  BP 110/80 (SITE: left arm, Orthostatic Position: sitting, Cuff Size: regular)   Pulse 88   Temp 36.2 C (97.1 F) (Tympanic)   Resp 24   Wt 85 kg (187 lb 6.3 oz)   SpO2 95%   BMI 24.06 kg/m   General Appearance: healthy, alert, no distress, pleasant affect, cooperative; pursed lip breathing  Neck:  Neck supple. No adenopathy, thyroid symmetric, normal size.  Heart:  normal rate and regular rhythm, no murmurs, clicks, or gallops.  Lungs: distant breath sounds throughout; no rales or rhonchi  Extremities:  no cyanosis, clubbing, or edema.         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 Patient verbalizes understanding of teaching and instructions.

## 2017-04-03 ENCOUNTER — Ambulatory Visit: Payer: Medicare Other | Attending: General Practice

## 2017-04-03 DIAGNOSIS — I724 Aneurysm of artery of lower extremity: Principal | ICD-10-CM | POA: Insufficient documentation

## 2017-04-03 LAB — INR: INR: 2.32 — AB (ref 0.87–1.18)

## 2017-04-04 ENCOUNTER — Ambulatory Visit: Payer: Self-pay | Admitting: Pharmacist

## 2017-04-04 DIAGNOSIS — I724 Aneurysm of artery of lower extremity: Principal | ICD-10-CM

## 2017-04-08 ENCOUNTER — Other Ambulatory Visit: Payer: Self-pay | Admitting: Vascular Surgery

## 2017-04-08 ENCOUNTER — Ambulatory Visit (HOSPITAL_BASED_OUTPATIENT_CLINIC_OR_DEPARTMENT_OTHER)
Admission: RE | Admit: 2017-04-08 | Discharge: 2017-04-08 | Disposition: A | Discharge status: Home / Self Care | Payer: Medicare Other | Source: Ambulatory Visit | Attending: Vascular Surgery | Admitting: Vascular Surgery

## 2017-04-08 ENCOUNTER — Ambulatory Visit
Admission: RE | Admit: 2017-04-08 | Discharge: 2017-04-08 | Disposition: A | Discharge status: Home / Self Care | Payer: Medicare Other | Source: Ambulatory Visit | Attending: Surgical Critical Care | Admitting: Surgical Critical Care

## 2017-04-08 DIAGNOSIS — I724 Aneurysm of artery of lower extremity: Principal | ICD-10-CM | POA: Insufficient documentation

## 2017-04-08 DIAGNOSIS — I771 Stricture of artery: Secondary | ICD-10-CM

## 2017-04-08 DIAGNOSIS — Z95828 Presence of other vascular implants and grafts: Secondary | ICD-10-CM

## 2017-04-16 ENCOUNTER — Encounter: Payer: Self-pay | Admitting: Family Medicine

## 2017-04-16 NOTE — Telephone Encounter (Signed)
From: Renard Hamperobert G Lyne  To: [name], [name], DO  Sent: 04/16/2017 12:50 AM PST  Subject: Visit Follow-up Question    Hi Dr. Derrek GuActon    I think that I may need to come back in for a follow-up on my COPD. The prescription you wrote for prednisone worked immediately and brought relief. I sleep through the night and my breathing was normal. One of the side effects of the drug was that my psoriasis started to go away with little to no treatment. Now three night ago it started again with the waking in the night for a couple hours and a lot less sleep overall. MY inhaler usage went to almost nothing to 4 plus times a day.     When can we make an appointment?    [name]

## 2017-04-17 ENCOUNTER — Ambulatory Visit: Payer: Medicare Other | Attending: General Practice

## 2017-04-17 DIAGNOSIS — I724 Aneurysm of artery of lower extremity: Principal | ICD-10-CM | POA: Insufficient documentation

## 2017-04-17 LAB — INR: INR: 2.16 — ABNORMAL HIGH (ref 0.87–1.18)

## 2017-04-18 ENCOUNTER — Ambulatory Visit: Payer: Self-pay | Admitting: Pharmacist

## 2017-04-18 DIAGNOSIS — I724 Aneurysm of artery of lower extremity: Principal | ICD-10-CM

## 2017-04-21 ENCOUNTER — Ambulatory Visit: Payer: Medicare Other | Attending: Vascular Surgery | Admitting: Vascular Surgery

## 2017-04-21 VITALS — BP 134/77 | HR 83 | Temp 96.1°F | Resp 19 | Wt 186.3 lb

## 2017-04-21 DIAGNOSIS — I70202 Unspecified atherosclerosis of native arteries of extremities, left leg: Secondary | ICD-10-CM

## 2017-04-21 DIAGNOSIS — I724 Aneurysm of artery of lower extremity: Secondary | ICD-10-CM | POA: Insufficient documentation

## 2017-04-21 DIAGNOSIS — Z95828 Presence of other vascular implants and grafts: Secondary | ICD-10-CM | POA: Insufficient documentation

## 2017-04-21 MED ORDER — ATORVASTATIN 40 MG TABLET
40.0000 mg | ORAL_TABLET | Freq: Every day | ORAL | 5 refills | Status: DC
Start: 2017-04-21 — End: 2017-10-14

## 2017-04-21 NOTE — Progress Notes (Signed)
Vascular Surgery Clinic Progress Note    04/21/2017  Attending: Donzetta Sprung, MD PI # [phone]    Chief Complaint: f/u left popliteal aneurysm s/p stenting c/b thrombosis requiring endovascular intervention     Subjective:   Kenneth Gordon is a 72yr male presenting to the clinic today for follow up for a left popliteal artery aneurysm s/p stenting, complicated by lysis requiring endovascular intervention.  The patient is approximately 6 months status post most recent thrombectomy and re-stenting.  In the interim, he started warfarin and is now therapeutic.  He denies issues with bleeding.  He is overall doing very well and is planning to move back to [place] sometime this year.  He denies any lower extremity pain, numbness, or tingling.  He is walking without difficulty.  He denies any new wounds.    Vascular Surgery History:  05/01/16- left popliteal aneurysm, s/p covered stent placement  10/30/16- left popliteal stent thrombosis s/p popliteal angioplasty and lysis  10/31/16-  angioplasty, thrombectomy of the popliteal artery, and stenting of the popliteal artery, mechanical thromboembolectomy of the left tibioperoneal and posterior tibial arteries      Past Medical History:   Diagnosis Date    Allergic rhinitis     Aneurysm of left popliteal artery 6cm     04/2016 stent in left popliteal artery-> 10/2016 thrombectomy/stent graft of left posterior tib  artery    COPD (chronic obstructive pulmonary disease)     Essential hypertension     Hematuria     10/2016 -> neg cystoscopy ;  on xarelto x 1 month after angioplasty/stent graft of left popliteal artery, thromboembolectomy of tibioperoneal/post tibial arteries    PAD (peripheral artery disease)     Psoriasis     Tobacco dependence        Past Surgical History:   Procedure Laterality Date    IR ABDOMINAL AORTAGRAM  05/06/2016    Aortogram with bilateral iliofemoral runoff; LLE angio ; self-expanding stents (Viabahn 7 x 100 mm and 9 x 50  mm) in LEFT popliteal artery  & Post-stenting angioplasty    IR ANGIOPLASTY PERIPHERAL ARTERY Left 10/31/2016    10/31/2016  Angioplasty /stent graft of left posterior tibial artery;thrombectomy of left tibio perioneal /post tibial arteries for Thrombosed stent graft in left popliteal artery aneurysm       Social History     Socioeconomic History    Marital status: DIVORCED     Spouse name: Not on file    Number of children: Not on file    Years of education: Not on file    Highest education level: Not on file   Social Needs    Financial resource strain: Not on file    Food insecurity - worry: Not on file    Food insecurity - inability: Not on file    Transportation needs - medical: Not on file    Transportation needs - non-medical: Not on file   Occupational History    Not on file   Tobacco Use    Smoking status: Current Every Day Smoker     Packs/day: 0.50     Years: 30.00     Pack years: 15.00     Types: Cigarettes    Smokeless tobacco: Never Used    Tobacco comment: needs  counseling   Substance and Sexual Activity    Alcohol use: No     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 30 vial 3    Albuterol/Ipratropium (COMBIVENT RESPIMAT) 20-100 mcg/actuation Inhaler Take 1 puff by inhalation 4 times daily. 4 g 12    Aspirin 81 mg Chewable Tablet Take 81 mg by mouth every day.      BREO ELLIPTA 100-25 mcg/dose Inhaler INHALE 1 PUFF BY MOUTH EVERY DAY 60 each 0    Lisinopril (PRINIVIL, ZESTRIL) 20 mg Tablet Take 1 tablet by mouth every day. (blood pressure) 90 tablet 0    umeclidinium (INCRUSE ELLIPTA) 62.5 mcg/actuation Disk with  Device Take 62.5 mcg by inhalation every day. 30 each 1    Warfarin (COUMADIN) 5 mg Tablet Take 2 tablets by mouth every day. 180 tablet 3     No current facility-administered medications on file prior to visit.        No Known Allergies    Review of Systems   Constitutional: Negative for activity change.   Cardiovascular: Positive for leg swelling.   Skin:        +psoriasis, no new wounds   Allergic/Immunologic: Negative for immunocompromised state.   Hematological: Does not bruise/bleed easily.   Psychiatric/Behavioral: Negative for agitation.       Physical Exam:  Temp: 35.6 C (96.1 F) (01/28 1036)  Temp src: Temporal (01/28 1036)  Pulse: 83 (01/28 1037)  BP: 134/77 (01/28 1037)  Resp: 19 (01/28 1036)  SpO2: --  Height: --  Weight: 84.5 kg (186 lb 4.6 oz) (01/28 1036)    Physical Exam   Constitutional: He is oriented to person, place, and time. He appears well-developed. No distress.   Eyes: No scleral icterus.   Cardiovascular: Normal rate.   Pulmonary/Chest: Effort normal. He has no wheezes.   Abdominal: Soft.   Musculoskeletal: Normal range of motion.   Neurological: He is alert and oriented to person, place, and time.   Skin: Skin is warm.   Rash consistent with psoriasis on bilateral lower extremities, no concurrent signs of infection     Vascular Exam:  RIGHT  PT: 2 Normal, DP: 2 Normal    LEFT  PT: 1 Weak, triphasic doppler signal DP: 1 Weak, biphasic doppler signal    Imaging:  ABI: Right ABI 1.13, TBI 0.65, Left ABI 0.92, TBI 0.39  LLE arterial duplex:   1. PATENT LEFT COMMON FEMORAL, PROFUNDA FEMORIS, AND SUPERFICIAL FEMORAL ARTERIES WITHOUT STENOSIS.  2. PATENT LEFT POPLITEAL ARTERY STENT GRAFT WITHOUT STENOSIS.  3. >50% STENOSIS OF LEFT TIBIOPERONEAL TRUNK.  4. >50% STENOSIS OF PROXIMAL LEFT ANTERIOR TIBIAL ARTERY.  5. >50% STENOSIS OF LEFT DISTAL POSTERIOR TIBIAL ARTERY.  6. NORMAL ARTERIAL PERFUSION OF RIGHT LOWER EXTREMITY.  7. MILDLY ABNORMAL ARTERIAL PERFUSION OF LEFT LOWER  EXTREMITY.      Labs:  INR 2.16    Assessment:  72 year old man with history of left popliteal artery aneurysm complicated by thrombosis status post lysis and endovascular interventions, who is overall doing well without any lower extremity symptoms and no imaging evidence of re-thrombosis.    Plan:  -  Continue warfarin indefinitely.  Patient has a plan for monitoring of warfarin when he moves back to [place], sometime this year  -Starting atorvastatin 40 mg daily for reduction of cardiovascular disease mortality     Renard HamperRobert G Shankel will followup in 6 months with repeat arterial LLE duplex/ABIs.    The patient was seen and examined with Dr. Donzetta SprungMatthew Mell, MD PI # [id number], vascular surgery attending of record, who is in agreement with this assessment and plan.    Rosamaria LintsLauren E Idus Rathke, MD Resident  PI #: [id number]  Personal Pager: [phone]  Vascular Surgery Service Pager: [phone]    ATTENDING PHYSICIAN ATTESTATION:    I have seen and evaluated Renard Hamperobert G Squillace and the care plan was developed with the resident.  I agree with the assessment and plan as outlined in the resident's note.    Donzetta SprungMatthew Mell, M.D.  Professor and Stage managerChief, Division of Vascular Surgery  [facility]  [address], [address]  [place], [place]  [id number]  [phone]

## 2017-04-21 NOTE — Nursing Note (Signed)
Vital signs taken, allergies verified, screened for pain,Aubrina Nieman Cooks, MA II

## 2017-04-22 ENCOUNTER — Encounter: Payer: Self-pay | Admitting: Family Medicine

## 2017-04-22 NOTE — Telephone Encounter (Signed)
From: Renard Hamperobert G Gibb  To: [name], [name], DO  Sent: 04/22/2017 1:26 PM PST  Subject: Visit Follow-up Question    Hi    I have the nebuliser sitting here but none of the vials for it. Did the prescription get sent to the wrong Pharmacy.    Ilda MoriBob Waites

## 2017-04-23 ENCOUNTER — Encounter: Payer: Self-pay | Admitting: Family Medicine

## 2017-04-23 DIAGNOSIS — J410 Simple chronic bronchitis: Principal | ICD-10-CM

## 2017-04-23 MED ORDER — ALBUTEROL SULFATE 1.25 MG/3 ML SOLUTION FOR NEBULIZATION
1.2500 mg | INHALATION_SOLUTION | RESPIRATORY_TRACT | 3 refills | Status: DC | PRN
Start: 2017-04-23 — End: 2017-04-25

## 2017-04-23 NOTE — Telephone Encounter (Signed)
Last refilled 04-02-17 . Last office visit 04-02-17

## 2017-04-24 ENCOUNTER — Telehealth: Payer: Self-pay | Admitting: Family Medicine

## 2017-04-24 NOTE — Telephone Encounter (Signed)
Pt is requesting a referral for CT Scan for his lungs please call pt when order is in system,

## 2017-04-24 NOTE — Telephone Encounter (Signed)
Patient informed. 

## 2017-04-24 NOTE — Telephone Encounter (Signed)
Left message for patient to return call to office, Kenneth OresJasvindar K Margherita Collyer, RMA  Why does patient want a CT scan, who recommended it.

## 2017-04-24 NOTE — Telephone Encounter (Signed)
Patient was sent MyChart message 03/24/17 from Lung Cancer Screening program to schedule CT screening    Their number is [phone] to schedule.

## 2017-04-24 NOTE — Telephone Encounter (Signed)
Patient returned call, per patient Dr. Derrek GuActon recommended the CT scan.

## 2017-04-25 ENCOUNTER — Other Ambulatory Visit: Payer: Self-pay | Admitting: Family Medicine

## 2017-04-25 DIAGNOSIS — J410 Simple chronic bronchitis: Principal | ICD-10-CM

## 2017-04-25 MED ORDER — ALBUTEROL SULFATE 1.25 MG/3 ML SOLUTION FOR NEBULIZATION
1.2500 mg | INHALATION_SOLUTION | RESPIRATORY_TRACT | 3 refills | Status: DC | PRN
Start: 2017-04-25 — End: 2017-05-01

## 2017-04-25 NOTE — Telephone Encounter (Signed)
Please resend

## 2017-04-28 ENCOUNTER — Ambulatory Visit: Payer: Medicare Other | Attending: General Practice

## 2017-04-28 DIAGNOSIS — I724 Aneurysm of artery of lower extremity: Principal | ICD-10-CM | POA: Insufficient documentation

## 2017-04-28 LAB — INR: INR: 2.67 — AB (ref 0.87–1.18)

## 2017-04-29 ENCOUNTER — Ambulatory Visit: Payer: Self-pay | Admitting: Pharmacist

## 2017-05-01 ENCOUNTER — Ambulatory Visit: Payer: Medicare Other | Admitting: Family Medicine

## 2017-05-01 VITALS — BP 120/86 | HR 88 | Temp 97.5°F | Resp 22 | Wt 188.5 lb

## 2017-05-01 DIAGNOSIS — J410 Simple chronic bronchitis: Secondary | ICD-10-CM

## 2017-05-01 DIAGNOSIS — F17210 Nicotine dependence, cigarettes, uncomplicated: Secondary | ICD-10-CM

## 2017-05-01 DIAGNOSIS — F17200 Nicotine dependence, unspecified, uncomplicated: Secondary | ICD-10-CM

## 2017-05-01 DIAGNOSIS — R0982 Postnasal drip: Secondary | ICD-10-CM

## 2017-05-01 DIAGNOSIS — R0602 Shortness of breath: Principal | ICD-10-CM

## 2017-05-01 MED ORDER — ALBUTEROL SULFATE 1.25 MG/3 ML SOLUTION FOR NEBULIZATION
1.2500 mg | INHALATION_SOLUTION | RESPIRATORY_TRACT | 3 refills | Status: DC | PRN
Start: 2017-05-01 — End: 2017-05-02

## 2017-05-01 MED ORDER — AZELASTINE 137 MCG (0.1 %) NASAL SPRAY
2.0000 | Freq: Two times a day (BID) | NASAL | 0 refills | Status: AC
Start: 2017-05-01 — End: 2018-04-26

## 2017-05-01 NOTE — Progress Notes (Signed)
Chief Complaint: Follow up COPD    Kenneth Gordon is a 72yr old male with history of COPD and tobacco dependence who presents with 2 months of increased work of breathing intermittently throughout the day. He will wake in the morning and feel "a weight" in his chest that will gradually improve but not completely go away.  No recent illness. Denies fever, chills, congestion. No change to color or amount of morning sputum. No hemoptysis. Notes "I had a hard time" when he went up in altitude to [place], [place].  Using Breo, Incruse and Combivent prn regularly with albuterol nebs prn. Continues to smoke up to 10 cigarettes daily.   States he had PFTs done in [place] approximately 6 months ago and will try to get records to be scanned into his chart.    REVIEW OF SYSTEMS:  Constitutional: negative.  Ears, Nose, Mouth, Throat: post nasal drip.  CV: negative.  Resp: shortness of breath.  GI: negative.    I did review patient's past medical and family/social history, no changes noted.    CURRENT MEDICATIONS:  Current Outpatient Medications   Medication Sig Dispense Refill    Albuterol (ACCUNEB) 1.25 mg/3 mL nebulizer solution Take 3 mL by inhalation every 4 hours if needed. 30 vial 3    Albuterol/Ipratropium (COMBIVENT RESPIMAT) 20-100 mcg/actuation Inhaler Take 1 puff by inhalation 4 times daily. 4 g 12    Aspirin 81 mg Chewable Tablet Take 81 mg by mouth every day.      Atorvastatin (LIPITOR) 40 mg tablet Take 1 tablet by mouth every day. 30 tablet 5    BREO ELLIPTA 100-25 mcg/dose Inhaler INHALE 1 PUFF BY MOUTH EVERY DAY 60 each 0    Lisinopril (PRINIVIL, ZESTRIL) 20 mg Tablet Take 1 tablet by mouth every day. (blood pressure) 90 tablet 0    umeclidinium (INCRUSE ELLIPTA) 62.5 mcg/actuation Disk with Device Take 62.5 mcg by inhalation every day. 30 each 1    Warfarin (COUMADIN) 5 mg Tablet Take 2 tablets by mouth every day. 180 tablet 3     No current facility-administered medications for this visit.         PHYSICAL EXAM:  BP 140/80 (SITE: left arm, Orthostatic Position: sitting, Cuff Size: regular)   Pulse 88   Temp 36.4 C (97.5 F) (Tympanic)   Resp 22   Wt 85.5 kg (188 lb 7.9 oz)   SpO2 97%   BMI 24.20 kg/m   General Appearance: healthy, alert, no distress, pleasant affect, cooperative.  Nose:  mucosa swollen, pale, and boggy.  Mouth: cobble stoning and post nasal drip.  Neck:  Neck supple. No adenopathy, thyroid symmetric, normal size.  Heart:  normal rate and regular rhythm, no murmurs, clicks, or gallops.  Lungs: distant breath sounds throughout.  Extremities:  no cyanosis, clubbing, or edema.         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 Patient verbalizes understanding of teaching and instructions.

## 2017-05-01 NOTE — Nursing Note (Signed)
Patient ID confirmed using 2 identifiers.    Vital signs taken, allergies verified, screened for pain, and med history taken.     Mammography screening for females between 50-71 years of age reviewed as appropriate.    Flu Vaccine status reviewed and updated as appropriate.      My Chart status reviewed and updated as appropriate.    I have asked the patient if they have received any medical treatment/consultations outside of the [facility] within the past 30 days.  The patient has indicated no to receiving services outside of the [facility].  If yes, I have requested information to ascertain these results

## 2017-05-02 ENCOUNTER — Encounter: Payer: Self-pay | Admitting: Family Medicine

## 2017-05-02 ENCOUNTER — Other Ambulatory Visit: Payer: Self-pay | Admitting: Family Medicine

## 2017-05-02 DIAGNOSIS — J410 Simple chronic bronchitis: Principal | ICD-10-CM

## 2017-05-02 MED ORDER — ALBUTEROL SULFATE 1.25 MG/3 ML SOLUTION FOR NEBULIZATION
1.2500 mg | INHALATION_SOLUTION | RESPIRATORY_TRACT | 3 refills | Status: DC | PRN
Start: 2017-05-02 — End: 2017-05-03

## 2017-05-02 NOTE — Telephone Encounter (Signed)
Sent yesterday but does not show received by pharmacy.

## 2017-05-03 ENCOUNTER — Telehealth: Payer: Self-pay | Admitting: Family Medicine

## 2017-05-03 ENCOUNTER — Encounter: Payer: Self-pay | Admitting: Family Medicine

## 2017-05-03 ENCOUNTER — Other Ambulatory Visit: Payer: Self-pay

## 2017-05-03 DIAGNOSIS — J410 Simple chronic bronchitis: Principal | ICD-10-CM

## 2017-05-03 MED ORDER — ALBUTEROL SULFATE 1.25 MG/3 ML SOLUTION FOR NEBULIZATION
1.2500 mg | INHALATION_SOLUTION | RESPIRATORY_TRACT | 0 refills | Status: DC | PRN
Start: 2017-05-03 — End: 2017-05-21

## 2017-05-05 ENCOUNTER — Encounter: Payer: Self-pay | Admitting: Family Medicine

## 2017-05-05 MED ORDER — ALBUTEROL SULFATE 1.25 MG/3 ML SOLUTION FOR NEBULIZATION
1.2500 mg | INHALATION_SOLUTION | RESPIRATORY_TRACT | 3 refills | Status: DC | PRN
Start: 2017-05-05 — End: 2018-01-09

## 2017-05-05 NOTE — Telephone Encounter (Signed)
[facility]  Telephone Encounter Report  CONFIDENTIAL  This information may have been disclosed to you from records whose confidentiality is protected by [employer] SystemsFederal law which prohibits you  from making any further disclosure of it without written consent of the person to whom it pertains, or as otherwise permitted by  [employer] Regulations. If you receive this in error please notify our office immediately at [phone].  Client Group: [place] [facility] - [place]  [place]  Call Completed Time: May 03, 2017 16:46  PST  Patient Name: Kenneth PuffRobert Gordon [facility] Call ID: [id number]  DOB: [birth year] Caller: Kenneth Puffobert Jeffords  Age: [age] Years Self  Gender: Male Phone: [phone]  State: [place]  [place]  Which Physician do you see at [facility]? Orlena SheldonActon, Thomas - Family Medicine  Insurance Unknown  Medical Record Number: unknown  Selected Goals:  Select Call Type (US): General Medical Information  RN/HSS: Did you use an Interpreter? No  Was authorization to a UCC given? No  Notes: Dr. Derrek GuActon wrote a prescription for albuterol nebulizer solution on 05/01/2017 and the  pharmacy doesn't have it. [facility] [address], [place], [place] [id number] [phone]. Dr. Artis DelayLao paged.  May 03, 2017 16:40 PST  User: BCRN Note Type: Follow Up Note: PCP Contact  May 03, 2017 16:40 PST  User: BCRN Note Type: Doctor Page Note: Dr. Artis DelayLao called back and will call in the  prescription. Caller called back and made aware.  May 03, 2017 16:46 PST  Attempts:  Call Id: [id number]

## 2017-05-05 NOTE — Telephone Encounter (Signed)
Last refilled 05-03-17 . Last office visit 05-01-17

## 2017-05-05 NOTE — Telephone Encounter (Signed)
Rx was sent to [facility] this morning. Please confirm this was the correct pharmacy.    Thank you.

## 2017-05-05 NOTE — Telephone Encounter (Signed)
From: Renard Hamperobert G Randol  To: [name], [name], DO  Sent: 05/03/2017 4:11 PM PST  Subject: Visit Follow-up Question    Dr Derrek GuActon    [facility] and nothing is there for my nebulizer. I thought that you fixed it.    Kenneth Gordon

## 2017-05-09 ENCOUNTER — Other Ambulatory Visit: Payer: Self-pay | Admitting: Family Medicine

## 2017-05-09 DIAGNOSIS — L409 Psoriasis, unspecified: Principal | ICD-10-CM

## 2017-05-13 NOTE — Telephone Encounter (Signed)
This is a  REFILL AUTHORIZATION for the faxed request for prescription number [id number].Last date filled 01/28/17.

## 2017-05-13 NOTE — Telephone Encounter (Signed)
Last refilled 04-18-16 . Last office visit 05-01-17

## 2017-05-17 ENCOUNTER — Other Ambulatory Visit: Payer: Self-pay | Admitting: Family Medicine

## 2017-05-17 DIAGNOSIS — J410 Simple chronic bronchitis: Principal | ICD-10-CM

## 2017-05-19 ENCOUNTER — Encounter: Payer: Self-pay | Admitting: Family Medicine

## 2017-05-19 NOTE — Telephone Encounter (Signed)
From: Renard Hamperobert G Fawver  To: [name], [name], DO  Sent: 05/19/2017 1:00 PM PST  Subject: Non-urgent Medical Advice Question    Hi Dr Derrek GuActon    I just received from Medicare.gov this "Medicare Part B [employer](Medical Insurance) covers cardiovascular screening blood tests once every 5 years." Has it been 5 years since my last test and or should I have it done now.    If the answer is yes please order it and [facility] and get my blood drawn.    Thanks in Advance

## 2017-05-19 NOTE — Telephone Encounter (Signed)
Last Seen 05/01/17  RX last dispensed 04/01/17

## 2017-05-19 NOTE — Telephone Encounter (Signed)
From: Renard Hamperobert G Simon  To: [name], [name], DO  Sent: 05/18/2017 4:00 PM PST  Subject: Non-urgent Medical Advice Question    Hi Dr. Derrek GuActon    I want to start taking the Bupropion SR again for smoking cessation. I have almost a full bottle and I need to quit so what do you say..    I am checking with the pharmacist to see if it alright to take with my blood thinner.    Celedonio Savageobert Mitchel

## 2017-05-21 ENCOUNTER — Other Ambulatory Visit: Payer: Self-pay | Admitting: Family Medicine

## 2017-05-21 DIAGNOSIS — J410 Simple chronic bronchitis: Principal | ICD-10-CM

## 2017-05-21 NOTE — Telephone Encounter (Signed)
Will call pharmacy to verify as refills remaining.

## 2017-05-21 NOTE — Telephone Encounter (Signed)
[facility] pt requesting refill. No refills remaining.    Albuterol (ACCUNEB) 1.25 mg/3 mL nebulizer solution  Last filled: 05/03/17  Last OV: 05/01/17    Recent refill sent by PCP did not go through to pharmacy. Please send refill if appropriate.

## 2017-05-22 ENCOUNTER — Ambulatory Visit: Payer: Medicare Other | Attending: General Practice

## 2017-05-22 DIAGNOSIS — I724 Aneurysm of artery of lower extremity: Principal | ICD-10-CM | POA: Insufficient documentation

## 2017-05-22 LAB — INR
INR: 3.24 — AB (ref 0.87–1.18)
Prothrombin Time: 30.3 s — ABNORMAL HIGH (ref 8.0–11.9)

## 2017-05-23 ENCOUNTER — Ambulatory Visit: Payer: Self-pay | Admitting: Pharmacist

## 2017-05-23 DIAGNOSIS — I724 Aneurysm of artery of lower extremity: Principal | ICD-10-CM

## 2017-05-26 NOTE — Progress Notes (Deleted)
Comprehensive COPD Clinic    Dear Dr. Derrek Gu, Noreene Larsson,    We had the pleasure of seeing Kenneth Gordon in the [facility] COPD [facility] today.  As you are aware, Kenneth Gordon is a 72yr old male patient presenting for evaluation and recommendations for management of COPD.    Chief Complaint/Reason for Referral:  COPD, dyspnea    History of Present Illness:     72yr old male with a pertinent history of *** presents for evaluation of COPD.     The patient is limited by dyspnea after walking/moving *** blocks.   The patient  DOES/DOES ZOX:09604 have chronic cough.   The patient  DOES/DOES VWU:98119 have chronic sputum production.      mMRC from 05/26/2017:  0to4  CAT from 05/26/2017: 0to40    The patient  DOES/DOES JYN:82956 endorse GERD symptoms.   The patient  DOES/DOES OZH:08657 endorse dysphagia symptoms.   The patient  DOES/DOES QIO:96295 endorse OSA symptoms.    Kenneth Gordon was first diagnosed with COPD ***.   The patient perceives their trajectory as  stable/slow decline/rapid decline.    The patient  DOES/DOES MWU:13244 have atopic symptoms/allergic rhinitis.   The patient  DOES/DOES WNU:27253 have a history of childhood asthma.     Kenneth Gordon is currently on supplemental oxygen  YES [id number] and is using ***L at rest, ***L with exertion and ***L with sleep.     Exacerbation History:  Prior Exacerbation :  YES GU:44034  Exacerbations per year on average:  0 THROUGH 74:25956  Prior Emergency Department Visits for COPD:  YES LO:75643  Prior Hospital Admissions:  YES PI:95188  Prior Acute Non-Invasive MV:   YES CZ:66063  Prior Endotracheal Intubation:  YES KZ:60109    Treatment:  Bronchodilator/nebulizer efficacy:  YES NA:35573  Chronic oral prednisone:  YES UK:02542  Response to prednisone:  YES HC:62376    Prior Comorbidity Screening:  Prior Alpha-1 Antitrypsin Deficiency Screening:   YES EG:31517    .  Previous clinic and hospital notes were  reviewed.***    Exposure/Social History:  Smoking: Quit  year, *** packs on average over *** years.    Heavy Alcohol Use:  YES OH:60737  Inhaled Illicit Drugs:   YES TG:62694  Intravenous Illicit Drugs:   YES WN:46270  Marijuana: YES JJ:00938  Vaping:   YES HW:29937  Asbestos exposure:  YES JI:96789  Silica/coal/berrylium:  FYB OF:75102  Occupational history: ***  Recent Travel: ***  The patient denies travel to the [facility].   Currently living in [facility] in ***    Past Medical History:  Pulmonary Diseases:  Asthma:  YES HE:52778  Bronchiectasis:  YES EU:23536  COPD:  YES RW:43154  Lung Cancer:  YES MG:86761  Pulmonary embolism/deep venous thrombosis:  YES PJ:09326  Pulmonary fibrosis/Interstitial lung disease:  YES ZT:24580  Pulmonary Hypertension:  YES DX:83382  Sleep apnea:  YES NK:53976    Extrapulmonary Diseases:  Cardiac arrythmias: YES BH:41937  Cancer:  YES TK:24097  Diabetes:  YES DZ:32992  Myocardial infarction/coronary artery disease:  YES EQ:68341  Hypertension:  YES DQ:22297  Heart Failure:  YES LG:92119  Connective Tissue Disease:  YES ER:74081    Other Medical Diseases:    free hand list of other medical problems aside from discrete diseases listed above    Past Surgical/Family/Social History:  Spine or chest wall surgery: YES KG:81856  Lung Resection:  YES DJ:49702    Family history of emphysema/COPD:  YES  ZO:10960O:14812  Family history of lung cancer:  YES AV:40981O:14812  Family history of Alpha-1 Antitrypsin Deficiency:  YES XB:14782O:14812    I did review patient's past medical and family/social history, no changes noted except as described above.     Review of Systems:  Review of Systems  All systems were reviewed and were negative unless otherwise noted in the HPI and above.    Medications/Allergies:  Chemotherapy:  YES NF:62130O:14812  Monoclonal antibodies:  YES QM:57846O:14812  Immunosuppressive medications:  YES NG:29528O:14812  Amiodarone:  YES UX:32440O:14812  Methotrexate:  YES  NU:27253O:14812  Herbal medications:  YES GU:44034O:14812    No Known Allergies    No outpatient medications have been marked as taking for the 05/27/17 encounter (Appointment) with Harless NakayamaKuhn, Lamyra Malcolm, MD.       Medication reconciliation was performed today. ***    PHYSICAL EXAM:  Vital Signs: There were no vitals taken for this visit.   Physical Exam    LAB TESTS/STUDIES  Lab Results   Component Value Date    WBC 7.7 11/28/2016    HGB 13.7 11/28/2016    AUTOEOS 6.5 11/28/2016    AUTOEOSABS 0.5 11/28/2016     Lab Results   Component Value Date    NA 137 11/02/2016       ENO: ***  Peripheral Eos:   Lab Results   Component Value Date    AUTOEOS 6.5 11/28/2016    AUTOEOSABS 0.5 11/28/2016     CRP: ***  BNP: ***  RAST: ***  IgE: No results found for: IGE  Alpha-1 Antitrypsin phenotype: ***    Radiographs:  ***    Echocardiography:  ***    Pulmonary Function Testing:  ***    Sleep Study:   ***    I personally reviewed: images, PFTs, echocardiogram, laboratory, and prior notes.***    ASSESSMENT AND PLAN    1) Chronic Obstructive Pulmonary Disease (COPD):  narrative of course   GOLD grade: ***; group:  A,B,C, or D   Phenotype:  ACOS, Emphysematous, Chronic Bronchitis, Frequent Exacerbator, Radiographic Only, Symptoms without obstruction    Bronchodilator therapy:   Proper bronchodilator use was reviewed with the patient. The current regimen includes:    ***     In patients with severe air trapping, nebulized bronchodilators and inhaled corticosteroids can offer superior drug delivery. ***    Supplemental Oxygen: the patient is *** a candidate for supplemental oxygen therapy.     Alpha-1 Antitrypsin Deficiency Screening: ATS guidelines recommend screening phenotype for all patients with evidence of obstruction on PFTs. Testing for this patient was ***.    Comorbidity Screening: patients with COPD are at an increased risk for pulmonary vascular disease, coronary artery disease, depression, and sleep disorders. ***    Lung Cancer Screening:  Lung cancer screening with low dose CT scan is indicated annually for patients aged 71 to 80 years who have over 30 pack-year of tobacco smoking and who are active smokers or quit within the past 15 years. Counseling on the pros and cons of lung cancer screening was performed. Shared decision making was used to decide to proceed with screening. ***    Airway Clearance: ***    Pulmonary Rehabilitation: Pulmonary rehabilitation programs involve exercise training, education, and behavior change, and they have been shown to decrease dyspnea, improve health-related quality of life, decrease days of hospitalization, and decrease health-care utilization. The optimal results are seen with motivated patients. Regardless of participation, continued activity and exercise is recommended for all patients  with COPD. Pulmonary rehabilitation has *** been offered to the patient.     Lung Volume Reduction: The efficacy of lung volume reduction surgery (LVRS) varied among patient groups, but there was an overall survival advantage that was most marked in patients with upper lobe emphysema and low exercise capacity (but FEV1 >20% and DLCO >20% predicted). Our patient is not a candidate for lung reduction surgery ***.     Endobronchial lung volume reduction (ELRV) is an alternative to LVRS. Endobronchial valves are indicated in severe COPD with severe hyperinflation.  The FDA recently approved ELRV for use in the [place], although it has been used in [place] in [place] for many years now.  The results of this study demonstrated a significant benefit in symptoms, functional status, and spirometry.  ***    Exacerbation Prevention:  Roflumilast, azithromycin, and N-acetycysteine are generally indicated in COPD patients with recurrent exacerbations despite maximally tolerated inhaled therapies. ***     Vaccinations: History was reviewed. We recommend influenza vaccine (yearly), pneumovax (PC23) as well as prevnar (PC13) (per CDC  protocol), and TdAP (every 10 years).    Nutrition: More than 30 percent of patients with severe COPD have protein-calorie malnutrition. This is associated with increased mortality, impaired respiratory muscle function, and diminished immune competence. High caloric dietary supplements and megace have been used in an effort to combat malnutrition. However, there is no evidence that either imparts long-term benefit.    Palliative Care: Disease trajectory is often difficult to predict in COPD. Frequent discussions with patients with severe or deteriorating COPD patients regarding their wishes is recommended.       [place] COPD Registry: The patient consented and enrolled in the [place] Registry  on ***    No diagnosis found.     EDUCATION:  I educated/instructed the patient or caregiver regarding all aspects of the above stated plan of care.  The patient or caregiver indicated understanding. Overall, I spent *** minutes directly with the patient,  *** minutes discussing education and our plan for further treatment and evaluation, and another *** minutes coordinating care.     We will plan to see Kenneth Gordon in COPD clinic in *** months for follow-up.  Please do not hesitate to contact us with any additional questions or concerns.     Sincerely,      (Electronically signed by):       ADD signature      [address]  [place], [address]  Phone: [phone]  Fax: [phone]

## 2017-05-27 ENCOUNTER — Ambulatory Visit: Payer: Medicare Other | Admitting: INTERNAL MEDICINE

## 2017-05-30 ENCOUNTER — Ambulatory Visit: Payer: Medicare Other | Attending: General Practice

## 2017-05-30 DIAGNOSIS — I724 Aneurysm of artery of lower extremity: Principal | ICD-10-CM | POA: Insufficient documentation

## 2017-05-30 LAB — INR
INR: 2.35 — AB (ref 0.87–1.18)
PROTHROMBIN TIME: 22.3 s — AB (ref 8.0–11.9)

## 2017-05-31 ENCOUNTER — Ambulatory Visit: Payer: Self-pay

## 2017-05-31 DIAGNOSIS — I724 Aneurysm of artery of lower extremity: Principal | ICD-10-CM

## 2017-06-09 ENCOUNTER — Other Ambulatory Visit: Payer: Self-pay | Admitting: Family Medicine

## 2017-06-09 DIAGNOSIS — J410 Simple chronic bronchitis: Principal | ICD-10-CM

## 2017-06-13 ENCOUNTER — Ambulatory Visit: Payer: Medicare Other | Attending: General Practice

## 2017-06-13 DIAGNOSIS — I724 Aneurysm of artery of lower extremity: Principal | ICD-10-CM | POA: Insufficient documentation

## 2017-06-13 LAB — INR
INR: 3.25 — AB (ref 0.87–1.18)
PROTHROMBIN TIME: 30.4 s — AB (ref 8.0–11.9)

## 2017-06-14 ENCOUNTER — Ambulatory Visit: Payer: Self-pay

## 2017-06-14 DIAGNOSIS — I724 Aneurysm of artery of lower extremity: Principal | ICD-10-CM

## 2017-06-23 ENCOUNTER — Encounter: Payer: Self-pay | Admitting: Family Medicine

## 2017-06-23 ENCOUNTER — Ambulatory Visit: Payer: Medicare Other | Attending: General Practice

## 2017-06-23 DIAGNOSIS — I724 Aneurysm of artery of lower extremity: Principal | ICD-10-CM | POA: Insufficient documentation

## 2017-06-23 LAB — INR
INR: 2.83 — AB (ref 0.87–1.18)
PROTHROMBIN TIME: 26.7 s — AB (ref 8.0–11.9)

## 2017-06-24 ENCOUNTER — Ambulatory Visit: Payer: Self-pay | Admitting: Pharmacist

## 2017-06-24 DIAGNOSIS — I724 Aneurysm of artery of lower extremity: Principal | ICD-10-CM

## 2017-07-02 ENCOUNTER — Encounter: Payer: Self-pay | Admitting: Family Medicine

## 2017-07-02 ENCOUNTER — Other Ambulatory Visit: Payer: Self-pay | Admitting: Family Medicine

## 2017-07-02 ENCOUNTER — Ambulatory Visit: Payer: Medicare Other | Attending: Internal Medicine | Admitting: Internal Medicine

## 2017-07-02 VITALS — BP 110/68 | HR 85 | Temp 97.8°F | Resp 20 | Ht 74.02 in | Wt 186.9 lb

## 2017-07-02 DIAGNOSIS — Z87891 Personal history of nicotine dependence: Secondary | ICD-10-CM | POA: Insufficient documentation

## 2017-07-02 DIAGNOSIS — J410 Simple chronic bronchitis: Principal | ICD-10-CM

## 2017-07-02 DIAGNOSIS — F17200 Nicotine dependence, unspecified, uncomplicated: Principal | ICD-10-CM | POA: Insufficient documentation

## 2017-07-02 NOTE — Progress Notes (Signed)
THIS VISIT SHOULD BE CODED U9811G0296. IT IS A COUNSELING VISIT FOR DISCUSSION OF LUNG CANCER SCREENING WHICH IS A COVERED PREVENTIVE SERVICE FOR WHICH THE PATIENT SHOULD HAVE NO CO-PAY OR OUT OF POCKET COSTS.      Kenneth HamperRobert G Gordon  Apr 05, 1945  [age]yr male active smoker here for discussion of lung cancer screening.  Pt with hx COPD and PVD.    Total pack years of smoking: 50 pack years (1ppd x 50 years)   Smoking status: Current smoker    Kenneth HamperRobert G Gordon currently has no symptoms of lung cancer. I discussed with the patient the benefits and harms of lung cancer screening, including risk of false positive studies, risk of over-diagnosis, the possible need for follow up testing after an abnormal study, the need for annual testing, and radiation exposure. We discussed the impact of comorbidities on the benefits and harms of screening. I used one or more visual aids during our discussion.    I counseled the patient on the benefits of smoking cessation and available resources for same.  Patient is currently trying to quit through vaping and is now down to 1/2ppd. Has tried patches and wellbutrin without success.     Pt reports that he had [place] in [place] and was told of spot on his lung but a subsequent CT also done in [place] found no abnormality (this was >4861yrs ago).    After discussion and shared decision making:  I placed an order for low dose screening CT lung.       Electronically signed by:    Delanna NoticeSusan Kiely Cousar, MD, MSc  Professor, Pulmonary and Critical Care Medicine  PI # [phone]  Pager [phone]

## 2017-07-02 NOTE — Patient Instructions (Addendum)
A low dose CT scan has been ordered today. Please call Radiology at [phone] to schedule this test. Please note that this test will likely require insurance authorization prior to scheduling. Please contact radiology in the next few days.

## 2017-07-02 NOTE — Nursing Note (Signed)
Patient was verified with 2 identifiers, vital signs taken, allergies verified, and patient was screened for pain. Patient comes in ambulatory.    DME company NA.    Patient is in ROOM # 3    Ellee Wawrzyniak, MA

## 2017-07-03 ENCOUNTER — Encounter: Payer: Self-pay | Admitting: Family Medicine

## 2017-07-03 ENCOUNTER — Ambulatory Visit: Payer: Medicare Other | Admitting: Family Medicine

## 2017-07-03 NOTE — Progress Notes (Signed)
The chart and Care Everywhere reviewed for gap closures.  Kenneth Gordon [place], LVN

## 2017-07-03 NOTE — Telephone Encounter (Signed)
Last refill:06/09/17

## 2017-07-07 ENCOUNTER — Ambulatory Visit: Payer: Medicare Other | Attending: General Practice

## 2017-07-07 DIAGNOSIS — I724 Aneurysm of artery of lower extremity: Principal | ICD-10-CM | POA: Insufficient documentation

## 2017-07-07 LAB — INR
INR: 2.93 — AB (ref 0.87–1.18)
PROTHROMBIN TIME: 27.6 s — AB (ref 8.0–11.9)

## 2017-07-08 ENCOUNTER — Ambulatory Visit: Payer: Self-pay | Admitting: Pharmacist

## 2017-07-08 DIAGNOSIS — I724 Aneurysm of artery of lower extremity: Principal | ICD-10-CM

## 2017-07-09 ENCOUNTER — Telehealth: Payer: Self-pay | Admitting: Internal Medicine

## 2017-07-09 DIAGNOSIS — Z87891 Personal history of nicotine dependence: Principal | ICD-10-CM

## 2017-07-09 NOTE — Telephone Encounter (Signed)
Patient is calling stating he contacted radiology to schedule for his LOW-DOSE COMPUTER TOMOGRAPHY and was told he is unable to schedule at this time due to no referral to radiology. Please call patient at [phone].    Thank You  BlancoBrittany Navy Belay Melcher-DallasMOSCII  [phone]  [phone]

## 2017-07-10 NOTE — Telephone Encounter (Signed)
Updated Dr. Alphonzo GrieveMurin via Outlook email per her request. Encounter is signed and closed but CT order is in Pending status and [facility] not [facility]. I have EMR help team working on issue.

## 2017-07-11 NOTE — Telephone Encounter (Signed)
Received call from HIM/EMR team.  Original order was entered incorrectly and is not able to drop into [facility] for referral processing. Dr. Alphonzo GrieveMurin will need to enter a new order for CT request to allow staff to obtain authorization and then redirect to Radiology. Radiant team will be connecting with Dr. Alphonzo GrieveMurin to provide feedback.  Called Sharrell Kuina Tham with Dr. Alphonzo GrieveMurin LDCT research and provided same information.

## 2017-07-15 ENCOUNTER — Encounter: Payer: Self-pay | Admitting: Internal Medicine

## 2017-07-15 ENCOUNTER — Ambulatory Visit: Payer: Medicare Other | Attending: Internal Medicine | Admitting: Internal Medicine

## 2017-07-15 VITALS — BP 105/69 | HR 94 | Temp 97.9°F | Resp 18 | Ht 74.02 in | Wt 180.8 lb

## 2017-07-15 DIAGNOSIS — J45909 Unspecified asthma, uncomplicated: Secondary | ICD-10-CM | POA: Insufficient documentation

## 2017-07-15 DIAGNOSIS — R0609 Other forms of dyspnea: Secondary | ICD-10-CM | POA: Insufficient documentation

## 2017-07-15 DIAGNOSIS — R0902 Hypoxemia: Secondary | ICD-10-CM | POA: Insufficient documentation

## 2017-07-15 DIAGNOSIS — F17200 Nicotine dependence, unspecified, uncomplicated: Secondary | ICD-10-CM | POA: Insufficient documentation

## 2017-07-15 DIAGNOSIS — J301 Allergic rhinitis due to pollen: Secondary | ICD-10-CM | POA: Insufficient documentation

## 2017-07-15 DIAGNOSIS — J449 Chronic obstructive pulmonary disease, unspecified: Principal | ICD-10-CM | POA: Insufficient documentation

## 2017-07-15 MED ORDER — PREDNISONE 20 MG TABLET
40.0000 mg | ORAL_TABLET | Freq: Every day | ORAL | 0 refills | Status: DC
Start: 2017-07-15 — End: 2017-08-28

## 2017-07-15 MED ORDER — BUDESONIDE-FORMOTEROL HFA 160 MCG-4.5 MCG/ACTUATION AEROSOL INHALER
2.0000 | INHALATION_SPRAY | Freq: Two times a day (BID) | RESPIRATORY_TRACT | 11 refills | Status: DC
Start: 2017-07-15 — End: 2018-06-30

## 2017-07-15 MED ORDER — TIOTROPIUM BROMIDE 2.5 MCG/ACTUATION MIST FOR INHALATION
2.0000 | Freq: Every day | RESPIRATORY_TRACT | 11 refills | Status: DC
Start: 2017-07-15 — End: 2018-07-06

## 2017-07-15 NOTE — Patient Instructions (Addendum)
Please contact the COPD case manager for any questions about your COPD or your COPD action plan. The COPD case manager can be reached by calling their office at [phone]. After hours, please contact the COPD case manager by pager at [phone](COPD).  After the recorded message, please enter your phone number and he/she will return your call.      ###    You have chronic nasal stuffiness, sinusitis, or post-nasal drip. I am recommending twice daily nasal irrigation twice a day. You may find a nasal irrigation system at your local pharmacy, [facility], [facility], [facility], or other pharmacy stores. One system is [employer] and comes as a kit. There are many other systems as well.     The directions for irrigation generally come with the kits. Briefly:    1. Fill the squeeze bottle with water and add the powder allowing it to dissolve. Do not use tap water alone as this may cause nasal irritation or burning. If you use a homemade formula, be sure it buffers tap water before using.     2. Tilt your head forward and insert the tip of the squeeze bottle into one nostril.    3. Squeeze a stream of liquid from the bottle into your nostril and allow it to come out the other nostril.     4. Repeat this in the other nostril.    ###    The SPACER I provided should be used with SYMBICORT.       Electronically signed by:    Prince Rome, MD, MAS  Associate Professor of [place], Pulmonary and Critical Care Medicine      A Pulmonary Function Test has been ordered for you. The Pulmonary Lab may be booked out 6-8 weeks.  Please make sure your test is completed prior to your follow-up appointment.    To schedule this appointment please call [phone].      When scheduling your appointment you will need the following information:    Your name, with spelling: Kenneth Gordon   You medical record number: [id number]   Ordering physician: Prince Rome, MD   Test ordered:  Pulmonary function test,exercise oximetry      Here is a place to write down the time and date of your appointment:    Pulmonary [facility], Room [address]  [place], [place] [address]   Time:    Date:

## 2017-07-15 NOTE — Telephone Encounter (Signed)
Left VM message with scheduling instructions.

## 2017-07-15 NOTE — Progress Notes (Signed)
Comprehensive COPD Clinic      Dear Dr. Derrek Gu, Kenneth Gordon,    We had the pleasure of seeing Kenneth Gordon in the [facility] COPD Clinic today.  As you are aware, Kenneth Gordon is a 72yr old male patient presenting for evaluation and recommendations for management of COPD.    Chief Complaint/Reason for Referral:  COPD,  dyspnea    History of Present Illness:     72yr old male with a pertinent history of COPD, smoking, peripheral vascular disease, allergic rhinitis, psoriasis, and HTN presents for evaluation of COPD.    Of note, the pt recently say my colleague Dr. Alphonzo Grieve regarding lung cancer screening. He has an upcoming appointment for a LDCT scan.     The patient is limited by dyspnea after walking/moving 2 blocks.   The patient does have chronic cough and sputum producition.   The patient does have wheezing     mMRC from 07/15/2017: 3  CAT from 07/15/2017: 18    The patient does not endorse GERD symptoms.   The patient does not endorse dysphagia symptoms.   The patient does not endorse OSA symptoms.  The patient does endorse rhinitis and post nasal drip symptoms.   The patient does not have a history of childhood asthma.     Kenneth Gordon was first diagnosed with COPD ~2 years ago.   The patient perceives their trajectory as slowly progressive.  Current COPD-related regimen includes: Breo, Incruse, and nebulized meds (we worked on teqchnique)      Kenneth Gordon currently does not use supplemental oxygen.        Exacerbation History:  Prior Exacerbation : yes  Exacerbations per year on average: 1  Prior Emergency Department Visits for COPD: yes  Prior Hospital Admissions: no   Prior Acute Non-Invasive MV: no  Prior Endotracheal Intubation: no    Treatment:  Bronchodilator/nebulizer efficacy: yes  Chronic oral prednisone: no  Response to prednisone: yes  Leukotriene anatgonist: no     Previous clinic and hospital notes were reviewed.    Exposure/Social History:  Smoking: Current, 0.5  packs on average over 35 years.    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   Currently living in [place] and [place]  Born in [place]    Past Medical History:  Pulmonary Diseases:  Asthma: no  Bronchiectasis: no  COPD: yes  Lung Cancer: no  Pulmonary embolism/deep venous thrombosis: yes RLE ?arterial now has stent; currently on warfarin  Pulmonary fibrosis/Interstitial lung disease: no  Pulmonary Hypertension: no  Sleep apnea: no   Previous lung transplantation: no    Extrapulmonary Diseases:  Cardiac arrythmias:no  Cancer: no  Diabetes: no  Myocardial infarction/coronary artery disease: no  Hypertension: yes  Heart Failure: no  Connective Tissue Disease: no    Other Medical Diseases:   Psoriasis    Past Surgical/Family/Social History:  Spine or chest wall surgery:no  Lung Resection: no    Family history of emphysema/COPD: no  Family history of asthma: no       I did review patient's past medical and  family/social history, no changes noted except as described above.     Review of Systems:  Review of Systems   Constitutional: Negative.  Negative for fatigue.   HENT: Positive for postnasal drip and sinus pressure.    Eyes: Negative.    Respiratory: Positive for chest tightness, shortness of breath and wheezing. Negative for cough and choking.    Cardiovascular: Negative for leg swelling.   Gastrointestinal:        Occasional GERD sxs   Endocrine: Negative.    Genitourinary: Negative.    Musculoskeletal: Negative.    Skin: Positive for rash.        Psoriasis   Allergic/Immunologic: Positive  for environmental allergies.   Neurological: Negative.    Hematological: Negative.    Psychiatric/Behavioral: Negative.      All systems were reviewed and were negative unless otherwise noted in the HPI and above.    Medications/Allergies:  Chemotherapy: no  Monoclonal antibodies: no  Immunosuppressive medications: no  Amiodarone: no  Methotrexate: no  Herbal medications: no    No Known Allergies    No outpatient medications have been marked as taking for the 07/15/17 encounter (Office Visit) with Carlyon Shadow, MD.       Medication reconciliation was performed today.     PHYSICAL EXAM:  Vital Signs: BP 105/69 (SITE: left arm, Orthostatic Position: sitting, Cuff Size: regular)   Pulse 94   Temp 36.6 C (97.9 F) (Temporal)   Resp 18   Ht 1.88 m (6' 2.02")   Wt 82 kg (180 lb 12.4 oz)   SpO2 93%   BMI 23.20 kg/m    Physical Exam   Constitutional: He is oriented to person, place, and time and well-developed, well-nourished, and in no distress. No distress.   Audible wheezing   HENT:   Head: Normocephalic and atraumatic.   Right Ear: External ear normal.   Left Ear: External ear normal.   Mouth/Throat: Oropharyngeal exudate present.   Abundant mucus in nasal passages  Brown stains on post OP and tongue   Eyes: Pupils are equal, round, and reactive to light. Conjunctivae and EOM are normal. Right eye exhibits no discharge. Left eye exhibits no discharge. No scleral icterus.   Neck: Normal range of motion. Neck supple. No JVD present. No tracheal deviation present. No thyromegaly present.   Cardiovascular: Normal rate, regular rhythm and normal heart sounds. Exam reveals no gallop and no friction rub.   No murmur heard.  Pulmonary/Chest: Effort normal. No stridor. No respiratory distress. He has wheezes. He has no rales. He exhibits no tenderness.   B/l high-pitched end-exp wheezing globally   Abdominal: Soft. Bowel sounds are normal. He exhibits no distension. There is no tenderness. There is no rebound.    Musculoskeletal: Normal range of motion. He exhibits no edema, tenderness or deformity.   Lymphadenopathy:     He has no cervical adenopathy.   Neurological: He is alert and oriented to person, place, and time. He exhibits normal muscle tone.   Skin: Skin is warm and dry. Rash noted. He is not diaphoretic. No erythema. No pallor.   Psiariasis plaques on extensor elbows   Psychiatric: Mood and affect normal.   Vitals reviewed.      LAB TESTS/STUDIES  Lab Results   Component Value Date    WBC 7.7 11/28/2016    HGB 13.7 11/28/2016    PLT 251 11/28/2016     Lab Results   Component Value Date    NA 137 11/02/2016  ENO: 11  Peripheral Eos:   Lab Results   Component Value Date    AUTOEOS 6.5 11/28/2016    AUTOEOSABS 0.5 11/28/2016     IgE: No results found for: IGE  CRP: pending  BNP: none  RAST: none  Alpha-1 Antitrypsin phenotype: pending    Radiographs:  pending    Echocardiography:  none    Pulmonary Function Testing:  pending    I personally reviewed: images, PFTs, echocardiogram, laboratory, and prior notes.    ASSESSMENT AND PLAN    (J44.9) Chronic obstructive pulmonary disease, unspecified COPD type (HCC)  (primary encounter diagnosis): Pt with likely COPD given clinical airflow limitation, though he clearly has atopic disease characterized by wheezing, rhinosinus sxs, and an elevated peripheral eosinophil count. Additionally, his history of intermittent dyspnea with wheezing supports this. At this point he is having an asthma/COPD exacerbation likely from aeroallergens, and I will prescribe his a burst of steroids. For his chronic controller medications, i would like to start him on Symbicort and Spiriva Respimat because each may be deposited with less inspiratory effort into his airways. I am not certain that his current regimen of Breo Ellipta and Incuse are effective because of impaired deposition. In addition, we discussed smoking cessation.        GOLD grade: deferred; group: D             Phenotype:   ACO +/- emphysema     COPD Registry: patient enrolled in Comprehensive COPD Clinic Registry 07/15/2017    Bronchodilator therapy:   Proper bronchodilator use was reviewed with the patient. The current regimen includes:    Breo switched to Symbicort plus a spacer   Incruse switched to Spiriva Respimat   Albuterol nebs PRN   Combivent Respimat PRN     In patients with severe air trapping or diminished PIF, nebulized bronchodilators and inhaled corticosteroids can offer superior drug delivery.      Airway Clearance: Not needed at present, though will re-eval at next visit    Comorbidity Screening: patients with asthma are at an increased risk for allergic coronary artery disease, OSA, pulmonary hypertension, depression, among other diseases.     Alpha-1 Antitrypsin Deficiency Screening: ATS guidelines recommend screening phenotype for all patients with evidence of obstruction on PFTs. Testing for this patient was ordered today.    Pulmonary Rehabilitation: pulmonary rehabilitation programs involve exercise training, education, and behavior change and have been shown to decrease dyspnea, improve health-related quality of life, decrease days of hospitalization, and decrease health-care utilization. The optimal results are seen with motivated patients.   This has NOT been offered to the patient as he is having an AECOPD and is still smoking. Regardless of participation, continued activity and exercise is recommended for all patients with COPD.     Lung Cancer Screening: Lung cancer screening with low dose CT scan is indicated annually for patients aged 43 to 80 years who have over 30 pack-year of tobacco smoking and who are active smokers or quit within the past 15 years. This was not discussed today.    Lung Volume Reduction Interventions: The efficacy of LVRS varied among patient groups, but there was an overall survival advantage that was most marked in patients with upper lobe emphysema and low exercise capacity (but  FEV1 >20% and DLCO >20% predicted).    Endobronchial valves are indicated in patients with persistent, severe symptoms despite maximal medical therapy with evidence of hyperinflation (TLC >100% and RV >175%) and CT demonstrated severe emphysema  and fissure completeness. Our center participated in the LIBERATE trial studying the efficacy of endobronchial valves in severe COPD.  The results of this study demonstrated a significant benefit in symptoms, functional status, and spirometry.  The FDA just recently approved endobronchial valves for use in the [place] within the past weeks, although it has been used in [place] in [place] for many years now.  We are in the process of preparing to use these valves for clinical practice.    Exacerbation Prevention:  Roflumilast and chronic azithromycin (and to a lesser degree NAC) use are generally limited to COPD patients with recurrent exacerbations despite maximally tolerated inhaled therapies.      Vaccinations: History was reviewed. We recommend influenza vaccine (yearly), pneumovax (PC23) as well as prevnar (PC13) (per CDC protocol), and TdAP (every 10 years).    Nutrition: More than 30 percent of patients with severe COPD have protein-calorie malnutrition. This is associated with increased mortality, impaired respiratory muscle function, and diminished immune competence. High caloric dietary supplements and megace have been used in an effort to combat malnutrition. However, there is no evidence that either imparts long-term benefit.    Palliative Care: disease trajectory is often difficult to predict in COPD. Frequent discussions with patients with severe or deteriorating COPD regarding their wishes is recommended.     Plan: PULMONARY FUNCTION TEST COMPLETE  (PFT) -         PULMONARY LAB, EXERCISE OXIMETRY, C-REACTIVE         PROTEIN, FIBRIN(OGEN) DEG. PRODUCT, EXHALED GAS        DETERMINATION- NITRIC OXIDE, POC SPIROMETRY,         Budesonide/Formoterol (SYMBICORT)  160-4.5         mcg/actuation Inhaler, Tiotropium (SPIRIVA         RESPIMAT) 2.5 mcg/actuation Inhaler    (J45.909) Asthma, unspecified asthma severity, unspecified whether complicated, unspecified whether persistent  Comment: Likely Asthma-COPD Overlap  Plan: Budesonide/Formoterol (SYMBICORT) 160-4.5         mcg/actuation Inhaler, Tiotropium (SPIRIVA         RESPIMAT) 2.5 mcg/actuation Inhaler, PredniSONE        (DELTASONE) 20 mg Tablet, RAST Kenneth Gordon    (F17.200) Smoking  Comment: Advised cessation. He is trying vaporizing. We did not discuss other options including Chantix as he is having an AECOPD.  Plan: SMOKING CESSATION - QUITLINE    (R09.02) Hypoxemia  Comment: Associated with his COPD. Exercise oximetry ordered.  Plan: PULMONARY FUNCTION TEST COMPLETE  (PFT) -         PULMONARY LAB, EXERCISE OXIMETRY    (J30.1) Seasonal allergic rhinitis due to pollen  Comment: Advised nasal irrigation followed by his nasal spray.  Plan: RAST Kenneth Gordon    (R06.09) DOE (dyspnea on exertion)  Comment: Multifactorial. Given his age and active smoking, he is at elevated risk for a CV event in the next 10 years. Will start with a TTE to eval for occult disease, DD., and PH.   Plan: ECHOCARDIOGRAM COMPLETE    EDUCATION:  I educated/instructed the patient or caregiver regarding all aspects of the above stated plan of care.  The patient or caregiver indicated understanding. Overall, I spent 40 minutes directly with the patient,  35 minutes discussing education and our plan for further treatment and evaluation, and another 5 minutes coordinating care.     We will plan to see ALASDAIR KLEVE in COPD clinic in 3 months for follow-up.  Please do not hesitate to contact us  with any additional questions or concerns.     Sincerely,    Electronically signed by:    Carlyon ShadowMichael Karyna Bessler, MD, MAS  Associate Professor of Medicine  [facility], Pulmonary and Critical Care Medicine          [address], [address]  [place], [id number]  Phone:  [phone]  Fax: [phone]

## 2017-07-15 NOTE — Progress Notes (Signed)
COPD Respiratory Therapist Evaluation Note from 07/15/2017    The patient was seen 07/15/2017 in the [place] [facility] clinic.      --Patient at high risk for exacerbation? Yes, pt continues to smoke, query medication deposition with Elliptca and  In the midst of an exacerbation. mMRC   --Spirometry: Results for Kenneth Gordon, Kenneth Gordon (MRN [id number]) as of 07/15/2017 12:13   Ref. Range 07/15/2017 12:10   FEV1 Unknown 0.69   FEV1 % PRED Unknown 18   FVC Unknown 1.99   FVC % PRED Unknown 40      --Inhaler technique reviewed: spacer not needed for his respiratory medication devices, ie on Respimat   --Home DME reviewed and appropriate: states nebulizer is working well.   --Supplemental oxygen qualification test: NA    Kenneth Gordon would benefit from having his medication delivery devices changed to a nebulizer or Respimat form. Encouraged to take his LABA/ICS and LAMA at the same time, not opposite ends of the day.       Electronically signed:    Benedict NeedyMarcy Zemira Zehring, RRT, RT IV, AE-C  COPD/Asthma Case Manager  [phone]  [facility] [phone]

## 2017-07-15 NOTE — Nursing Note (Signed)
Patient was verified with 2 identifiers, vital signs taken, allergies verified, and patient was screened for pain. Patient comes in ambulatory.        Patient is in ROOM # 1    Shaquoya Cosper, MA

## 2017-07-15 NOTE — Addendum Note (Signed)
Addended by: Delanna NoticeMURIN, Tongela Encinas on: 07/15/2017 03:49 PM     Modules accepted: Orders

## 2017-07-16 ENCOUNTER — Encounter: Payer: Self-pay | Admitting: Internal Medicine

## 2017-07-16 LAB — POC SPIROMETRY
FEV1 % PRED: 18
FEV1: 0.69
FVC % PRED: 40
FVC: 1.99

## 2017-07-17 ENCOUNTER — Encounter: Payer: Self-pay | Admitting: Family Medicine

## 2017-07-17 ENCOUNTER — Ambulatory Visit: Payer: Medicare Other | Admitting: Family Medicine

## 2017-07-17 VITALS — BP 110/70 | HR 88 | Temp 98.2°F | Resp 26 | Wt 183.0 lb

## 2017-07-17 DIAGNOSIS — F17200 Nicotine dependence, unspecified, uncomplicated: Secondary | ICD-10-CM

## 2017-07-17 DIAGNOSIS — J301 Allergic rhinitis due to pollen: Secondary | ICD-10-CM | POA: Insufficient documentation

## 2017-07-17 DIAGNOSIS — I70202 Unspecified atherosclerosis of native arteries of extremities, left leg: Secondary | ICD-10-CM

## 2017-07-17 DIAGNOSIS — J449 Chronic obstructive pulmonary disease, unspecified: Principal | ICD-10-CM

## 2017-07-17 DIAGNOSIS — J45909 Unspecified asthma, uncomplicated: Secondary | ICD-10-CM

## 2017-07-17 MED ORDER — FLUTICASONE PROPIONATE 50 MCG/ACTUATION NASAL SPRAY,SUSPENSION: 1.0000 | Freq: Every day | NASAL | 5 refills | Status: DC

## 2017-07-17 NOTE — Nursing Note (Signed)
Patient ID confirmed using 2 identifiers.    Vital signs taken, allergies verified, screened for pain, and med history taken.     Mammography screening for females between 50-71 years of age reviewed as appropriate.    Flu Vaccine status reviewed and updated as appropriate.      My Chart status reviewed and updated as appropriate.    I have asked the patient if they have received any medical treatment/consultations outside of the [facility] within the past 30 days.  The patient has indicated no to receiving services outside of the [facility].  If yes, I have requested information to ascertain these results

## 2017-07-17 NOTE — Progress Notes (Signed)
Chief Complaint: Follow up COPD    Kenneth Gordon is a 72yr old malewith history of COPD, asthma and tobacco dependencewho presents for 2 month follow up. Reports that over this last weekend he began having increased work of breathing intermittently throughout the day.He would wake in the morning and feel "a weight" in his chest that would gradually improve but not completely go away. He notes associated nasal congestion, runny nose and PND and throat clearing.   Saw Pulmonology 07/15/17 who started him on a steroid burst and changed his inhalers to respimat style for better ease of use.  Denies fever, chills, congestion.No change to color or amount of morning sputum. No hemoptysis.     REVIEW OF SYSTEMS:  Constitutional: negative.  Ears, Nose, Mouth, Throat: post nasal drip, rhinorrhea.  CV: negative.  Resp: cough, sputum.    I did review patient's past medical and family/social history, no changes noted.    CURRENT MEDICATIONS:  Current Outpatient Medications   Medication Sig Dispense Refill    Albuterol (ACCUNEB) 1.25 mg/3 mL nebulizer solution Take 3 mL by inhalation every 4 hours if needed. 30 vial 3    Albuterol (ACCUNEB) 1.25 mg/3 mL nebulizer solution INHALE THE CONTENTS OF 1 VIAL(3 ML) BY MOUTH EVERY 4 HOURS AS NEEDED 75 mL 0    Albuterol/Ipratropium (COMBIVENT RESPIMAT) 20-100 mcg/actuation Inhaler Take 1 puff by inhalation 4 times daily. 4 g 12    Aspirin 81 mg Chewable Tablet Take 81 mg by mouth every day.      Atorvastatin (LIPITOR) 40 mg tablet Take 1 tablet by mouth every day. 30 tablet 5    Azelastine Nasal (ASTELIN) 137 mcg (0.1 %) Spray Instill 2 sprays into EACH nostril 2 times daily. 1 bottle 0    Budesonide/Formoterol (SYMBICORT) 160-4.5 mcg/actuation Inhaler Take 2 puffs by inhalation 2 times daily. 1 inhaler 11    Clobetasol (TEMOVATE) 0.05 % Ointment APPLY SPARINGLY EXTERNALLY TO THE AFFECTED AREA TWICE DAILY 60 g 0    Lisinopril (PRINIVIL, ZESTRIL) 20 mg Tablet TAKE 1 TABLET  BY MOUTH EVERY DAY FOR BLOOD PRESSURE 90 tablet 1    PredniSONE (DELTASONE) 20 mg Tablet Take 2 tablets by mouth every day. take 2 tablets by mouth once daily for 5 days 10 tablet 0    Tiotropium (SPIRIVA RESPIMAT) 2.5 mcg/actuation Inhaler Take 2 sprays by inhalation every day. 4 g 11    Warfarin (COUMADIN) 5 mg Tablet Take 2 tablets by mouth every day. 180 tablet 3     No current facility-administered medications for this visit.        PHYSICAL EXAM:  BP 160/80 (SITE: left arm, Orthostatic Position: sitting, Cuff Size: regular)   Pulse 88   Temp 36.8 C (98.2 F) (Tympanic)   Resp 26   Wt 83 kg (183 lb)   SpO2 91%   BMI 23.49 kg/m   General Appearance: healthy, alert, no distress, pleasant affect, cooperative.  Eyes:  conjunctivae and corneas clear. PERRL, EOM's intact. sclerae normal.  Ears:  normal TMs and canal and external inspection of ears show no abnormality.  Nose:  mucosa swollen, pale, and boggy.  Mouth: post nasal drip.  Neck:  Neck supple. No adenopathy, thyroid symmetric, normal size.  Heart:  normal rate and regular rhythm, no murmurs, clicks, or gallops.  Lungs: Distant breath sounds diffusely; expiratory wheezes.           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 Patient verbalizes understanding of teaching and instructions.

## 2017-07-22 ENCOUNTER — Ambulatory Visit: Payer: Medicare Other | Attending: General Practice

## 2017-07-22 DIAGNOSIS — J301 Allergic rhinitis due to pollen: Secondary | ICD-10-CM | POA: Insufficient documentation

## 2017-07-22 DIAGNOSIS — J449 Chronic obstructive pulmonary disease, unspecified: Secondary | ICD-10-CM | POA: Insufficient documentation

## 2017-07-22 DIAGNOSIS — I724 Aneurysm of artery of lower extremity: Principal | ICD-10-CM | POA: Insufficient documentation

## 2017-07-22 DIAGNOSIS — J45909 Unspecified asthma, uncomplicated: Secondary | ICD-10-CM | POA: Insufficient documentation

## 2017-07-22 LAB — CBC WITH DIFFERENTIAL
BASOPHILS % AUTO: 0.8 %
BASOPHILS ABS AUTO: 0.1 10*3/uL (ref 0.0–0.2)
EOSINOPHIL % AUTO: 1.7 %
EOSINOPHIL ABS AUTO: 0.2 10*3/uL (ref 0.0–0.5)
HEMOGLOBIN: 15.5 g/dL (ref 13.5–17.5)
Hematocrit: 46.7 % (ref 41.0–53.0)
LYMPHOCYTE ABS AUTO: 1.5 10*3/uL (ref 1.0–4.8)
LYMPHOCYTES % AUTO: 15.9 %
MCH: 30.8 pg (ref 27.0–33.0)
MCHC: 33.2 % (ref 32.0–36.0)
MCV: 92.7 fL (ref 80.0–100.0)
MONOCYTES % AUTO: 8.7 %
MONOCYTES ABS AUTO: 0.8 10*3/uL (ref 0.1–0.8)
MPV: 8 fL (ref 6.8–10.0)
NEUTROPHIL ABS AUTO: 6.7 10*3/uL (ref 1.8–7.7)
NEUTROPHILS % AUTO: 72.9 %
PLATELET COUNT: 264 10*3/uL (ref 130–400)
RDW: 14.7 % (ref 0.0–14.7)
RED CELL COUNT: 5.04 10*6/uL (ref 4.50–5.90)
White Blood Cell Count: 9.2 10*3/uL (ref 4.5–11.0)

## 2017-07-22 LAB — INR
INR: 2.91 — AB (ref 0.87–1.18)
PROTHROMBIN TIME: 27.4 s — AB (ref 8.0–11.9)

## 2017-07-22 LAB — FIBRIN(OGEN) DEG. PRODUCT

## 2017-07-22 LAB — C-REACTIVE PROTEIN: C-REACTIVE PROTEIN (MULTPLIER 0.1): 0.5 mg/dL (ref 0.1–0.8)

## 2017-07-23 ENCOUNTER — Ambulatory Visit: Payer: Self-pay | Admitting: Pharmacist

## 2017-07-23 DIAGNOSIS — I724 Aneurysm of artery of lower extremity: Principal | ICD-10-CM

## 2017-07-29 ENCOUNTER — Other Ambulatory Visit: Payer: Self-pay | Admitting: Family Medicine

## 2017-07-29 DIAGNOSIS — J410 Simple chronic bronchitis: Principal | ICD-10-CM

## 2017-07-30 NOTE — Telephone Encounter (Signed)
Patient last office visit on 07/17/17  Last filled 12/18/16

## 2017-08-04 ENCOUNTER — Ambulatory Visit (INDEPENDENT_AMBULATORY_CARE_PROVIDER_SITE_OTHER): Payer: Medicare Other

## 2017-08-04 ENCOUNTER — Ambulatory Visit
Admission: RE | Admit: 2017-08-04 | Discharge: 2017-08-04 | Disposition: A | Discharge status: Home / Self Care | Payer: Medicare Other | Source: Ambulatory Visit | Attending: Internal Medicine | Admitting: Internal Medicine

## 2017-08-04 DIAGNOSIS — I724 Aneurysm of artery of lower extremity: Principal | ICD-10-CM

## 2017-08-04 DIAGNOSIS — R931 Abnormal findings on diagnostic imaging of heart and coronary circulation: Secondary | ICD-10-CM | POA: Insufficient documentation

## 2017-08-04 DIAGNOSIS — R0609 Other forms of dyspnea: Principal | ICD-10-CM | POA: Insufficient documentation

## 2017-08-04 LAB — ECHOCARDIOGRAM COMPLETE
IVSD 2D: 1.2 cm (ref 0.6–0.9)
LEFT INTERNAL DIMENSION IN SYSTOLE: 2.8 cm
LEFT VENTRICULAR INTERNAL DIMENSION IN DIASTOLE: 4 cm (ref 3.8–5.2)
LVEF (EST): 60 %
POSTERIOR WALL: 1 cm (ref 0.6–0.9)
RV D(2D): 3.1 cm
TAPSE: 1.5 cm

## 2017-08-04 LAB — INR
INR: 2.25 — AB (ref 0.87–1.18)
PROTHROMBIN TIME: 21.4 s — AB (ref 8.0–11.9)

## 2017-08-05 ENCOUNTER — Other Ambulatory Visit: Payer: Self-pay | Admitting: Pharmacist

## 2017-08-05 ENCOUNTER — Ambulatory Visit: Payer: Self-pay | Admitting: Pharmacist

## 2017-08-05 DIAGNOSIS — I724 Aneurysm of artery of lower extremity: Principal | ICD-10-CM

## 2017-08-06 LAB — RAST GERSHWIN PANEL

## 2017-08-11 ENCOUNTER — Other Ambulatory Visit: Payer: Self-pay | Admitting: Family Medicine

## 2017-08-11 DIAGNOSIS — J410 Simple chronic bronchitis: Principal | ICD-10-CM

## 2017-08-11 NOTE — Telephone Encounter (Signed)
Last OV 07/17/17

## 2017-08-19 ENCOUNTER — Other Ambulatory Visit: Payer: Self-pay | Admitting: Family Medicine

## 2017-08-19 DIAGNOSIS — J410 Simple chronic bronchitis: Principal | ICD-10-CM

## 2017-08-20 NOTE — Telephone Encounter (Signed)
Last OV 07/17/17

## 2017-08-27 ENCOUNTER — Other Ambulatory Visit: Payer: Self-pay | Admitting: Family Medicine

## 2017-08-27 DIAGNOSIS — J410 Simple chronic bronchitis: Principal | ICD-10-CM

## 2017-08-28 ENCOUNTER — Ambulatory Visit: Payer: Self-pay | Admitting: Family Medicine

## 2017-08-28 ENCOUNTER — Encounter: Payer: Self-pay | Admitting: Family Medicine

## 2017-08-28 ENCOUNTER — Other Ambulatory Visit: Payer: Self-pay | Admitting: Family Medicine

## 2017-08-28 DIAGNOSIS — J45909 Unspecified asthma, uncomplicated: Principal | ICD-10-CM

## 2017-08-28 DIAGNOSIS — J410 Simple chronic bronchitis: Principal | ICD-10-CM

## 2017-08-28 MED ORDER — PREDNISONE 20 MG TABLET
40.0000 mg | ORAL_TABLET | Freq: Every day | ORAL | 0 refills | Status: AC
Start: 2017-08-28 — End: 2018-08-28

## 2017-08-28 MED ORDER — IPRATROPIUM 20 MCG-ALBUTEROL 100 MCG/ACTUATION MIST FOR INHALATION
1.0000 | Freq: Four times a day (QID) | RESPIRATORY_TRACT | 3 refills | Status: DC
Start: 2017-08-28 — End: 2017-10-10

## 2017-08-28 NOTE — Telephone Encounter (Signed)
Recommend, if he already hasn't, to contact Pulmonologist for further insights.     In the meantime a refill of prednisone Rx sent to pharmacy.

## 2017-08-28 NOTE — Telephone Encounter (Signed)
From: [name]  To: [name], [name], DO  Sent: 08/27/2017 5:34 PM PDT  Subject: Visit Follow-up Question    Hi Dr. Derrek GuActon    Well, my COPD gotten worse over the last 2 weeks and I have been going through my combivent real fast. I am now almost constantly trying to clear the phlegm from my throat. I don't have any real drainage that I can tell from my nasal cavity but it is coming from somewhere. It is really affecting me doing anything and sleep is almost impossible with max of 2 hours. I am also taking the nebuliser treatments but they don't seem to effect the phlegm. I am taking the nasal spray you prescripted once a day in the morning. Help!!    Kenneth Gordon

## 2017-08-28 NOTE — Telephone Encounter (Signed)
Patients last refill 08/20/17  Patients last office visit 07/17/17

## 2017-08-29 ENCOUNTER — Ambulatory Visit: Payer: Medicare Other | Attending: General Practice

## 2017-08-29 DIAGNOSIS — I724 Aneurysm of artery of lower extremity: Principal | ICD-10-CM | POA: Insufficient documentation

## 2017-08-29 LAB — INR
INR: 1.44 — ABNORMAL HIGH (ref 0.87–1.18)
PROTHROMBIN TIME: 14 s — AB (ref 8.0–11.9)

## 2017-08-30 ENCOUNTER — Ambulatory Visit: Payer: Self-pay

## 2017-08-30 DIAGNOSIS — I724 Aneurysm of artery of lower extremity: Principal | ICD-10-CM

## 2017-09-14 ENCOUNTER — Other Ambulatory Visit: Payer: Self-pay | Admitting: Family Medicine

## 2017-09-14 DIAGNOSIS — J410 Simple chronic bronchitis: Principal | ICD-10-CM

## 2017-09-15 NOTE — Telephone Encounter (Signed)
Last office visit: 07/17/17

## 2017-09-18 ENCOUNTER — Ambulatory Visit: Payer: Medicare Other | Attending: General Practice

## 2017-09-18 DIAGNOSIS — I724 Aneurysm of artery of lower extremity: Principal | ICD-10-CM | POA: Insufficient documentation

## 2017-09-18 LAB — INR
INR: 1.79 — AB (ref 0.87–1.18)
PROTHROMBIN TIME: 17.2 s — AB (ref 8.0–11.9)

## 2017-09-19 ENCOUNTER — Ambulatory Visit: Payer: Self-pay | Admitting: Pharmacist

## 2017-09-19 DIAGNOSIS — I724 Aneurysm of artery of lower extremity: Principal | ICD-10-CM

## 2017-10-02 ENCOUNTER — Ambulatory Visit: Payer: Medicare Other | Attending: General Practice

## 2017-10-02 DIAGNOSIS — I724 Aneurysm of artery of lower extremity: Principal | ICD-10-CM | POA: Insufficient documentation

## 2017-10-02 LAB — INR
INR: 1.94 — ABNORMAL HIGH (ref 0.87–1.18)
Prothrombin Time: 18.6 s — ABNORMAL HIGH (ref 8.0–11.9)

## 2017-10-03 ENCOUNTER — Ambulatory Visit: Payer: Self-pay

## 2017-10-03 DIAGNOSIS — I724 Aneurysm of artery of lower extremity: Principal | ICD-10-CM

## 2017-10-06 ENCOUNTER — Ambulatory Visit: Payer: Medicare Other | Attending: Internal Medicine

## 2017-10-06 DIAGNOSIS — J449 Chronic obstructive pulmonary disease, unspecified: Principal | ICD-10-CM | POA: Insufficient documentation

## 2017-10-06 DIAGNOSIS — R0902 Hypoxemia: Secondary | ICD-10-CM | POA: Insufficient documentation

## 2017-10-06 NOTE — Progress Notes (Signed)
Outpatient pulmonary function test(s) and/or exercise oximetry have been completed as ordered. Preliminary test(s) results are now available.    Lab Results   Component Value Date    SPIROMETRY 2.48 10/06/2017    SPIROMETRY 0.85 10/06/2017    SPIROMETRY 34 10/06/2017         PHYSICIAN INTERPRETED REPORT TO FOLLOW WITHIN 7 DAYS.

## 2017-10-10 ENCOUNTER — Other Ambulatory Visit: Payer: Self-pay | Admitting: Family Medicine

## 2017-10-10 DIAGNOSIS — J410 Simple chronic bronchitis: Principal | ICD-10-CM

## 2017-10-10 NOTE — Telephone Encounter (Signed)
Last refill:08/28/17  Last seen:07/17/17

## 2017-10-11 LAB — PULMONARY FUNCTION TEST COMPLETE  (PFT) - PULMONARY LAB
DLCO (PRE): 11.4 mL/mmHg/min
FEV1 (PRE): 0.85 L (ref 0–12)
FEV1/FVC (PRE): 34 % (ref 0–12)
FVC (PRE): 2.48 L (ref 0–12)
TLC (PRE): 5.93 L (ref 0.05–11.99)

## 2017-10-13 ENCOUNTER — Encounter: Payer: Self-pay | Admitting: Internal Medicine

## 2017-10-13 NOTE — Telephone Encounter (Signed)
From: Kenneth Gordon  To: Carlyon ShadowSchivo, Michael, MD  Sent: 10/10/2017 9:15 PM PDT  Subject: Non-urgent Medical Advice Question    Hi     Well, my chest is tightening up and I am having trouble sleeping.     It started monday morning for a hour and then has gotten more prevalent as the week has progress. None of the treatments seems to have any long term effect    I am getting 4- six hours of sleep a night in 1-3 hour session. Just waking up and not able to go back to sleep for an hour or so.    [name]

## 2017-10-14 ENCOUNTER — Ambulatory Visit: Payer: Medicare Other | Attending: Internal Medicine | Admitting: Internal Medicine

## 2017-10-14 ENCOUNTER — Encounter: Payer: Self-pay | Admitting: Internal Medicine

## 2017-10-14 ENCOUNTER — Other Ambulatory Visit: Payer: Self-pay | Admitting: SURGERY

## 2017-10-14 VITALS — BP 133/80 | HR 85 | Temp 98.1°F | Resp 18 | Ht 74.02 in | Wt 169.8 lb

## 2017-10-14 DIAGNOSIS — J449 Chronic obstructive pulmonary disease, unspecified: Principal | ICD-10-CM | POA: Insufficient documentation

## 2017-10-14 DIAGNOSIS — I519 Heart disease, unspecified: Secondary | ICD-10-CM | POA: Insufficient documentation

## 2017-10-14 DIAGNOSIS — F17200 Nicotine dependence, unspecified, uncomplicated: Secondary | ICD-10-CM | POA: Insufficient documentation

## 2017-10-14 DIAGNOSIS — I5189 Other ill-defined heart diseases: Secondary | ICD-10-CM

## 2017-10-14 DIAGNOSIS — R0982 Postnasal drip: Secondary | ICD-10-CM | POA: Insufficient documentation

## 2017-10-14 DIAGNOSIS — R0609 Other forms of dyspnea: Secondary | ICD-10-CM | POA: Insufficient documentation

## 2017-10-14 DIAGNOSIS — R0902 Hypoxemia: Secondary | ICD-10-CM | POA: Insufficient documentation

## 2017-10-14 NOTE — Nursing Note (Signed)
Patient was verified with 2 identifiers, vital signs taken, allergies verified, and patient was screened for pain. Patient comes in ambulatory.      Patient is in ROOM # 2    Miyeko Mahlum, MA

## 2017-10-14 NOTE — Progress Notes (Signed)
Comprehensive COPD Clinic      Dear Dr. Derrek Gu, Noreene Larsson,    We had the pleasure of seeing Kenneth Gordon in the [facility] in followup today. He was last seen on 07/15/2017.    Chief Complaint/Reason for Referral:  COPD  Subjective/Interval Events: No acute exacerbations. Pt still smoking 1/2 PPD. He has tried the 1-800 smoking cessation number, but this was not effective. Now he takes nicotine lozenges. He is not interested in Chantix. He thinks he may have an adverse reaction. He also did not have a LD chest CT given possible insurance issues.     mMRC form 10/14/2017: 2 (prior 3)  CAT from 10/14/2017: 19 (prior 18)    Interval Exacerbations: The pt had somewhat of chest tightness after taking his PFT. This recurred over several days then it resolved.     He uses Combivent 3-4 times per day regardless if he needs it  Symbicort BID  Spiriva Respimat 2p daily     eNO today: 20    Since his last clinic visits, work up has revealed see below     I did review patient's past medical and family/social history, no changes noted except as described above.     Review of Systems:  Review of Systems   Constitutional: Positive for fatigue.   HENT: Positive for congestion and postnasal drip.    Respiratory: Positive for cough and shortness of breath.         Cough related to runny nose and PND   Cardiovascular: Negative for chest pain and leg swelling.   Gastrointestinal: Negative.    Allergic/Immunologic: Positive for environmental allergies.   All other systems reviewed and are negative.      All systems were reviewed and were negative unless otherwise noted in the HPI and above.      No Known Allergies    No outpatient medications have been marked as taking for the 10/14/17 encounter (Office Visit) with Carlyon Shadow, MD.       Medication reconciliation was performed today.     PHYSICAL EXAM:  Vital Signs: BP 133/80 (SITE: right arm, Orthostatic Position: sitting, Cuff Size: regular)    Pulse 85   Temp 36.7 C (98.1 F) (Temporal)   Resp 18   Ht 1.88 m (6' 2.02")   Wt 77 kg (169 lb 12.1 oz)   SpO2 93%   BMI 21.79 kg/m    Constitutional: He is oriented to person, place, and time and well-developed, well-nourished, and in no distress. No distress.   Audible wheezing   HENT:   Head: Normocephalic and atraumatic.   Right Ear: External ear normal.   Left Ear: External ear normal.   Mouth/Throat: Oropharyngeal exudate present.   Abundant mucus in nasal passages  Brown stains on post OP and tongue   Eyes: Pupils are equal, round, and reactive to light. Conjunctivae and EOM are normal. Right eye exhibits no discharge. Left eye exhibits no discharge. No scleral icterus.   Neck: Normal range of motion. Neck supple. No JVD present. No tracheal deviation present. No thyromegaly present.   Cardiovascular: Normal rate, regular rhythm and normal heart sounds. Exam reveals no gallop and no friction rub.   No murmur heard.  Pulmonary/Chest: Effort normal. No stridor. No respiratory distress. He has wheezes. He has no rales. He exhibits no tenderness.   B/l high-pitched end-exp wheezing globally   Abdominal: Soft. Bowel sounds are normal. He exhibits no distension. There is no  tenderness. There is no rebound.   Musculoskeletal: Normal range of motion. He exhibits no edema, tenderness or deformity.   Lymphadenopathy:     He has no cervical adenopathy.   Neurological: He is alert and oriented to person, place, and time. He exhibits normal muscle tone.   Skin: Skin is warm and dry. Rash noted. He is not diaphoretic. No erythema. No pallor.   Psiariasis plaques on extensor elbows   Psychiatric: Mood and affect normal.   Vitals reviewed.    LAB TESTS/STUDIES  Lab Results   Component Value Date    WBC 9.2 07/22/2017    HGB 15.5 07/22/2017    PLT 264 07/22/2017     Lab Results   Component Value Date    NA 137 11/02/2016     Peripheral Eos:   Lab Results   Component Value Date    AUTOEOS 1.7 07/22/2017    AUTOEOSABS  0.2 07/22/2017     IgE: No results found for: IGE    CRP: Normal  BNP: none  RAST: none  Alpha-1 Antitrypsin phenotype: not done  ENO: 20    Radiographs:  None new    Echocardiography:  TTE 08/04/2017. Nl EF, conc LVH, DD stage 1    Pulmonary Function Testing:  My interpretation is Mixed restriction and obstruction with the predominant pattern being very severe obstruction and a severely reduced DLCO    I personally reviewed: images, PFTs, echocardiogram, laboratory, and prior notes.    ASSESSMENT AND PLAN    (J44.9) Chronic obstructive pulmonary disease, unspecified COPD type (HCC)  (primary encounter diagnosis): Pt with overlap disease. He is still smoking and we spent a considerable amount of time addressing how he might quit. He is not ready. He does ask about home PR exercises, and I provided him a copy. He should continue his current meds. I again strongly advised smoking cessation.           GOLD grade: IV; group: D             Phenotype:  ACO +/- emphysema  Clinician estimated probability of severe COPD exacerbation (ie, ED or hospital admission) in the following 90 days: 75%    COPD Registry: patient enrolled in Comprehensive COPD Clinic Registry 07/15/2017    Bronchodilator therapy:   Proper bronchodilator use was reviewed with the patient. The current regimen includes:    Spiriva respimat   Symbicort   Combivent respimat      In patients with severe air trapping or diminished PIF, nebulized bronchodilators and inhaled corticosteroids can offer superior drug delivery.      Airway Clearance: N/A    Comorbidity Screening: patients with asthma are at an increased risk for allergic coronary artery disease, OSA, pulmonary hypertension, depression, among other diseases.     Alpha-1 Antitrypsin Deficiency Screening: ATS guidelines recommend screening phenotype for all patients with evidence of obstruction on PFTs. Testing for this patient was ordered again today.    Lung Cancer Screening: Lung cancer screening with  low dose CT scan is indicated annually for patients aged 106 to 80 years who have over 30 pack-year of tobacco smoking and who are active smokers or quit within the past 15 years. This was addressed by Dr. Alphonzo Grieve in April, but the pt cannot get the CT scan presumably for insurance reasons.     Exacerbation Prevention:  Roflumilast and chronic azithromycin (and to a lesser degree NAC) use are generally limited to COPD patients with recurrent exacerbations despite maximally  tolerated inhaled therapies.     Vaccinations: History was reviewed. We recommend influenza vaccine (yearly), pneumovax (PC23) as well as prevnar (PC13) (per CDC protocol), and TdAP (every 10 years).    Nutrition: More than 30 percent of patients with severe COPD have protein-calorie malnutrition. This is associated with increased mortality, impaired respiratory muscle function, and diminished immune competence. High caloric dietary supplements and megace have been used in an effort to combat malnutrition. However, there is no evidence that either imparts long-term benefit.    Palliative Care: disease trajectory is often difficult to predict in COPD. Frequent discussions with patients with severe or deteriorating COPD regarding their wishes is recommended.       (R06.09) DOE (dyspnea on exertion)  Comment: Multifactorial though mainly driven by his COPD.     (I51.9) Diastolic dysfunction  Comment: Contributing to his DOE    (R09.02) Hypoxemia  Comment: Will need a formal assessment       EDUCATION:  I educated/instructed the patient or caregiver regarding all aspects of the above stated plan of care.  The patient or caregiver indicated understanding. Overall, I spent 20 minutes directly with the patient,  15 minutes discussing education and our plan for further treatment and evaluation, and another 5 minutes coordinating care.     We will plan to see Kenneth Gordon in general Pulm clinic in 6 months for follow-up.  Please do not hesitate to  contact us with any additional questions or concerns.     Sincerely,    Electronically signed by:    Carlyon ShadowMichael Shakedra Beam, MD, MAS  Associate Professor of Medicine  [facility], Pulmonary and Critical Care Medicine          [address], [address]  [place], [id number]  Phone: [phone]) [phone]  Fax: [phone]) [phone]

## 2017-10-14 NOTE — Patient Instructions (Addendum)
Please contact the COPD case manager for any questions about your COPD or your COPD action plan. The COPD case manager can be reached by calling their office at [phone]. After hours, please contact the COPD case manager by pager at [phone](COPD).  After the recorded message, please enter your phone number and he/she will return your call.      ######    You have chronic nasal stuffiness, sinusitis, or post-nasal drip. I am recommending twice daily nasal irrigation twice a day. You may find a nasal irrigation system at your local pharmacy, [facility], [employer], [employer], or other pharmacy stores. One system is [employer] and comes as a kit. There are many other systems as well.     The directions for irrigation generally come with the kits. Briefly:    1. Fill the squeeze bottle with water and add the powder allowing it to dissolve. Do not use tap water alone as this may cause nasal irritation or burning. If you use a homemade formula, be sure it buffers tap water before using.     2. Tilt your head forward and insert the tip of the squeeze bottle into one nostril.    3. Squeeze a stream of liquid from the bottle into your nostril and allow it to come out the other nostril.     4. Repeat this in the other nostril.      Electronically signed by:    Michael Schivo, MD, MAS  Associate Professor of Medicine  [place], Pulmonary and Critical Care Medicine

## 2017-10-15 NOTE — Telephone Encounter (Signed)
Faxed medication refill request received from patient's pharmacy. Physician or Nurse Practioner's most recent progress notes reviewed in EMR. Patient has been seen over the past year. Medication listed in the progress note. Medication refilled per [facility] Standardized Procedure  P&P II-31.

## 2017-10-16 ENCOUNTER — Ambulatory Visit: Payer: Medicare Other | Attending: General Practice | Admitting: Family Medicine

## 2017-10-16 ENCOUNTER — Ambulatory Visit (INDEPENDENT_AMBULATORY_CARE_PROVIDER_SITE_OTHER): Payer: Medicare Other

## 2017-10-16 ENCOUNTER — Other Ambulatory Visit: Payer: Self-pay | Admitting: Pharmacist

## 2017-10-16 VITALS — BP 120/69 | HR 70 | Temp 98.0°F | Resp 24 | Wt 169.8 lb

## 2017-10-16 DIAGNOSIS — J449 Chronic obstructive pulmonary disease, unspecified: Secondary | ICD-10-CM

## 2017-10-16 DIAGNOSIS — I70202 Unspecified atherosclerosis of native arteries of extremities, left leg: Secondary | ICD-10-CM | POA: Insufficient documentation

## 2017-10-16 DIAGNOSIS — J410 Simple chronic bronchitis: Secondary | ICD-10-CM | POA: Insufficient documentation

## 2017-10-16 DIAGNOSIS — Z1159 Encounter for screening for other viral diseases: Principal | ICD-10-CM

## 2017-10-16 DIAGNOSIS — I724 Aneurysm of artery of lower extremity: Principal | ICD-10-CM

## 2017-10-16 DIAGNOSIS — F17200 Nicotine dependence, unspecified, uncomplicated: Secondary | ICD-10-CM | POA: Insufficient documentation

## 2017-10-16 LAB — INR
INR: 1.75 — AB (ref 0.87–1.18)
PROTHROMBIN TIME: 16.9 s — AB (ref 8.0–11.9)

## 2017-10-16 NOTE — Nursing Note (Signed)
Patient ID confirmed using 2 identifiers.    Vital signs taken, allergies verified, screened for pain, and med history taken.     Mammography screening for females between 50-71 years of age reviewed as appropriate.    Flu Vaccine status reviewed and updated as appropriate.      My Chart status reviewed and updated as appropriate.    I have asked the patient if they have received any medical treatment/consultations outside of the [facility] within the past 30 days.  The patient has indicated no to receiving services outside of the [facility] system.  If yes, I have requested information to ascertain these results

## 2017-10-16 NOTE — Progress Notes (Signed)
Chief Complaint: Follow up COPD    Kenneth Gordon is a 72yr old malewith history of COPD, asthma and tobacco dependencewho presents for 3 month follow up. Reports he will still get get occasional increased work of breathing intermittently throughout the day usually in the morning when he wakes.He can wake in the morning and feel "a weight" in his chest that would gradually improve but not completely go away. He notes associated nasal congestion, runny nose and PND and throat clearing.     Saw Pulmonology 10/14/17 who have him on Symbicort, Spiriva respimat and Combivent respimat for better ease of use.    Denies fever, chills, congestion.No change to color or amount of morning sputum. No hemoptysis.         REVIEW OF SYSTEMS:  Constitutional: negative.  CV: negative.  Resp: negative.  GI: negative.  Neuro: negative.    I did review patient's past medical and family/social history, no changes noted.    CURRENT MEDICATIONS:  Current Outpatient Medications   Medication Sig Dispense Refill    Albuterol (ACCUNEB) 1.25 mg/3 mL nebulizer solution Take 3 mL by inhalation every 4 hours if needed. 30 vial 3    Albuterol (ACCUNEB) 1.25 mg/3 mL nebulizer solution INHALE THE CONTENTS OF 1 VIAL(3 ML) VIA NEBULIZER BY MOUTH EVERY 4 HOURS AS NEEDED 75 mL 0    Aspirin 81 mg Chewable Tablet Take 81 mg by mouth every day.      Atorvastatin (LIPITOR) 40 mg tablet Take 1 tablet by mouth every day. 90 tablet 3    Azelastine Nasal (ASTELIN) 137 mcg (0.1 %) Spray Instill 2 sprays into EACH nostril 2 times daily. 1 bottle 0    Budesonide/Formoterol (SYMBICORT) 160-4.5 mcg/actuation Inhaler Take 2 puffs by inhalation 2 times daily. 1 inhaler 11    Clobetasol (TEMOVATE) 0.05 % Ointment APPLY SPARINGLY EXTERNALLY TO THE AFFECTED AREA TWICE DAILY 60 g 0    COMBIVENT RESPIMAT 20-100 mcg/actuation Inhaler INHALE 1 PUFF BY MOUTH FOUR TIMES DAILY 4 g 0    Fluticasone (FLONASE) 50 mcg/actuation nasal spray Instill 1-2 sprays into  EACH nostril every day. (allergies) 16 g 5    Lisinopril (PRINIVIL, ZESTRIL) 20 mg Tablet TAKE 1 TABLET BY MOUTH EVERY DAY FOR BLOOD PRESSURE 90 tablet 1    PredniSONE (DELTASONE) 20 mg Tablet Take 2 tablets by mouth every day. take 2 tablets by mouth once daily for 5 days 10 tablet 0    Tiotropium (SPIRIVA RESPIMAT) 2.5 mcg/actuation Inhaler Take 2 sprays by inhalation every day. 4 g 11    Warfarin (COUMADIN) 5 mg Tablet Take 2 tablets by mouth every day. 180 tablet 3     No current facility-administered medications for this visit.        PHYSICAL EXAM:  BP 120/69 (SITE: left arm, Orthostatic Position: sitting, Cuff Size: regular)   Pulse 70   Temp 36.7 C (98 F) (Tympanic)   Resp 24   Wt 77 kg (169 lb 12.1 oz)   SpO2 95%   BMI 21.79 kg/m   General Appearance: healthy, alert, no distress, pleasant affect, cooperative.  Neck:  Neck supple. No adenopathy, thyroid symmetric, normal size.  Heart:  normal rate and regular rhythm, no murmurs, clicks, or gallops.  Lungs: Distant breath sounds diffusely; bilateral end expiratory wheezes.  Extremities:  no cyanosis, clubbing, or edema.         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 Patient verbalizes understanding of teaching and instructions.

## 2017-10-16 NOTE — Progress Notes (Signed)
Patient has been identified by the Clinical Pharmacist HCV Screening initiative and is indicated for a one-time HCV AB screen based on CDC recommendations as patient is born between [birth year]    Patient has been notified via Mychart and HCV AB order has been placed.        Kenneth Gordon, PharmD  Hepatology/Infectious Diseases Clinical Pharmacist  [phone], option 2

## 2017-10-17 ENCOUNTER — Ambulatory Visit: Payer: Self-pay

## 2017-10-17 DIAGNOSIS — I724 Aneurysm of artery of lower extremity: Principal | ICD-10-CM

## 2017-10-17 NOTE — Progress Notes (Signed)
Case discussed with pharmacy resident. Plan reviewed and approved by Norman Bier, Pharm.D.

## 2017-10-19 LAB — ALPHA-1-ANTITRYPSIN PHENO: ALPHA-1-ANTITRYPSIN: 186 mg/dL (ref 90–200)

## 2017-10-20 ENCOUNTER — Ambulatory Visit (HOSPITAL_BASED_OUTPATIENT_CLINIC_OR_DEPARTMENT_OTHER): Payer: Medicare Other | Admitting: Vascular Surgery

## 2017-10-20 ENCOUNTER — Encounter: Payer: Self-pay | Admitting: Vascular Surgery

## 2017-10-20 ENCOUNTER — Other Ambulatory Visit: Payer: Self-pay | Admitting: Family Medicine

## 2017-10-20 ENCOUNTER — Ambulatory Visit
Admission: RE | Admit: 2017-10-20 | Discharge: 2017-10-20 | Disposition: A | Discharge status: Home / Self Care | Payer: Medicare Other | Source: Ambulatory Visit | Attending: Vascular Surgery | Admitting: Vascular Surgery

## 2017-10-20 VITALS — BP 118/77 | HR 79 | Temp 96.6°F | Resp 16 | Wt 170.4 lb

## 2017-10-20 DIAGNOSIS — I724 Aneurysm of artery of lower extremity: Secondary | ICD-10-CM | POA: Insufficient documentation

## 2017-10-20 DIAGNOSIS — Z95828 Presence of other vascular implants and grafts: Secondary | ICD-10-CM

## 2017-10-20 DIAGNOSIS — I771 Stricture of artery: Secondary | ICD-10-CM

## 2017-10-20 DIAGNOSIS — J410 Simple chronic bronchitis: Principal | ICD-10-CM

## 2017-10-20 NOTE — Nursing Note (Signed)
Vital signs taken, allergies verified, screened for pain,  Agam Tuohy, MA

## 2017-10-20 NOTE — Patient Instructions (Signed)
Follow up in 6 months for ABI check.

## 2017-10-20 NOTE — Progress Notes (Signed)
Vascular Surgery Clinic Progress Note    10/20/2017  Attending: Donzetta SprungMatthew Mell, MD PI # [phone]    Chief Complaint: f/u left popliteal aneurysm s/p stenting c/b thrombosis requiring endovascular intervention     Subjective:   Kenneth HamperRobert G Reznick is a [age]yr man presenting to the clinic today for follow up for a left popliteal artery aneurysm s/p stenting, complicated by lysis requiring endovascular intervention.  The patient is approximately 71 months status post most recent thrombectomy and re-stenting.  In the interim, he started warfarin and is now therapeutic.  He denies any claudication, wounds or rest pain.     Vascular Surgery History:  05/01/16- left popliteal aneurysm, s/p covered stent placement  10/30/16- left popliteal stent thrombosis s/p popliteal angioplasty and lysis  10/31/16-  angioplasty, thrombectomy of the popliteal artery, and stenting of the popliteal artery, mechanical thromboembolectomy of the left tibioperoneal and posterior tibial arteries      Past Medical History:   Diagnosis Date    Allergic rhinitis     Aneurysm of left popliteal artery 6cm     04/2016 stent in left popliteal artery-> 10/2016 thrombectomy/stent graft of left posterior tib  artery    COPD (chronic obstructive pulmonary disease) (HCC)     Essential hypertension     Hematuria     10/2016 -> neg cystoscopy ;  on xarelto x 1 month after angioplasty/stent graft of left popliteal artery, thromboembolectomy of tibioperoneal/post tibial arteries    PAD (peripheral artery disease) (HCC)     Psoriasis     Tobacco dependence        Past Surgical History:   Procedure Laterality Date    IR ABDOMINAL AORTAGRAM  05/06/2016    Aortogram with bilateral iliofemoral runoff; LLE angio ; self-expanding stents (Viabahn 7 x 100 mm and 9 x 50  mm) in LEFT popliteal artery & Post-stenting angioplasty    IR ANGIOPLASTY PERIPHERAL ARTERY Left 10/31/2016    10/31/2016  Angioplasty /stent graft of left posterior tibial artery;thrombectomy of left tibio  perioneal /post tibial arteries for Thrombosed stent graft in left popliteal artery aneurysm       Social History     Socioeconomic History    Marital status: DIVORCED     Spouse name: Not on file    Number of children: Not on file    Years of education: Not on file    Highest education level: Not on file   Occupational History    Not on file   Social Needs    Financial resource strain: Not on file    Food insecurity:     Worry: Not on file     Inability: Not on file    Transportation needs:     Medical: Not on file     Non-medical: Not on file   Tobacco Use    Smoking status: Current Every Day Smoker     Packs/day: 0.50     Years: 30.00     Pack years: 15.00     Types: Cigarettes    Smokeless tobacco: Never Used    Tobacco comment: needs  counseling   Substance and Sexual Activity    Alcohol use: No     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26     Drug use: No    Sexual activity: Yes     Partners: Female   Lifestyle    Physical activity:     Days per week: Not on file     Minutes per session: Not on  file    Stress: Not on file   Relationships    Social connections:     Talks on phone: Not on file     Gets together: Not on file     Attends religious service: Not on file     Active member of club or organization: Not on file     Attends meetings of clubs or organizations: Not on file     Relationship status: Not on file    Intimate partner violence:     Fear of current or ex partner: Not on file     Emotionally abused: Not on file     Physically abused: Not on file     Forced sexual activity: Not on file   Other Topics Concern    Not on file   Social History Narrative    Not on file       Family History   Problem Relation Name Age of Onset    Diabetes Son         Current Outpatient Medications on File Prior to Visit   Medication Sig Dispense Refill    Albuterol (ACCUNEB) 1.25 mg/3 mL nebulizer solution Take 3 mL by inhalation every 4 hours if needed. 30 vial 3    Albuterol (ACCUNEB) 1.25 mg/3 mL nebulizer  solution INHALE THE CONTENTS OF 1 VIAL(3 ML) VIA NEBULIZER BY MOUTH EVERY 4 HOURS AS NEEDED 75 mL 0    Aspirin 81 mg Chewable Tablet Take 81 mg by mouth every day.      Atorvastatin (LIPITOR) 40 mg tablet Take 1 tablet by mouth every day. 90 tablet 3    Azelastine Nasal (ASTELIN) 137 mcg (0.1 %) Spray Instill 2 sprays into EACH nostril 2 times daily. 1 bottle 0    Budesonide/Formoterol (SYMBICORT) 160-4.5 mcg/actuation Inhaler Take 2 puffs by inhalation 2 times daily. 1 inhaler 11    Clobetasol (TEMOVATE) 0.05 % Ointment APPLY SPARINGLY EXTERNALLY TO THE AFFECTED AREA TWICE DAILY 60 g 0    COMBIVENT RESPIMAT 20-100 mcg/actuation Inhaler INHALE 1 PUFF BY MOUTH FOUR TIMES DAILY 4 g 0    Fluticasone (FLONASE) 50 mcg/actuation nasal spray Instill 1-2 sprays into EACH nostril every day. (allergies) 16 g 5    Lisinopril (PRINIVIL, ZESTRIL) 20 mg Tablet TAKE 1 TABLET BY MOUTH EVERY DAY FOR BLOOD PRESSURE 90 tablet 1    PredniSONE (DELTASONE) 20 mg Tablet Take 2 tablets by mouth every day. take 2 tablets by mouth once daily for 5 days 10 tablet 0    Tiotropium (SPIRIVA RESPIMAT) 2.5 mcg/actuation Inhaler Take 2 sprays by inhalation every day. 4 g 11    Warfarin (COUMADIN) 5 mg Tablet Take 2 tablets by mouth every day. 180 tablet 3     No current facility-administered medications on file prior to visit.        No Known Allergies    Review of Systems   Constitutional: Negative for activity change, appetite change, fatigue, fever and unexpected weight change.   HENT: Negative for hearing loss.    Eyes: Negative for visual disturbance.   Respiratory: Negative for cough and chest tightness.    Cardiovascular: Negative for chest pain and leg swelling.   Gastrointestinal: Negative for abdominal distention and abdominal pain.   Endocrine: Negative for cold intolerance.   Genitourinary: Negative for difficulty urinating.   Musculoskeletal: Negative for gait problem and joint swelling.   Skin: Negative for color change.         +psoriasis, no new  wounds   Allergic/Immunologic: Negative for immunocompromised state.   Neurological: Negative for dizziness and speech difficulty.   Hematological: Does not bruise/bleed easily.   Psychiatric/Behavioral: Negative for agitation.       Physical Exam:  Temp: 35.9 C (96.6 F) (07/29 1143)  Temp src: Tympanic (07/29 1143)  Pulse: 79 (07/29 1143)  BP: 118/77 (07/29 1143)  Resp: 16 (07/29 1143)  SpO2: --  Height: --  Weight: 77.3 kg (170 lb 6.7 oz) (07/29 1143)    Physical Exam   Constitutional: He is oriented to person, place, and time. He appears well-developed. No distress.   Eyes: Pupils are equal, round, and reactive to light. No scleral icterus.   Cardiovascular: Normal rate.   Pulses:       Dorsalis pedis pulses are 2+ on the right side.        Posterior tibial pulses are 2+ on the right side.   Pulmonary/Chest: Effort normal. He has no wheezes.   Abdominal: Soft.   Musculoskeletal: Normal range of motion.   Feet:   Right Foot:   Skin Integrity: Positive for callus and dry skin.   Left Foot:   Skin Integrity: Positive for callus and dry skin.   Neurological: He is alert and oriented to person, place, and time.   Skin: Skin is warm.   Rash consistent with psoriasis on bilateral lower extremities, no concurrent signs of infection     Imaging:  ABI: Right ABI 1.13, TBI 0.65, Left ABI 0.53   LLE arterial duplex:   Occulusion of the popliteal artery, patent distal tibial vessels    Labs:  INR 1.75    Assessment:  72 year old man with history of left popliteal artery aneurysm complicated by thrombosis status post lysis and endovascular interventions with re-thrombosis of the popliteal stent with no symptoms.     Plan:  -Stop warfarin  - Start ASA 81mg    - Continue statin  - Smoking cessation     ALEKSANDR PELLOW will followup in 6 months with repeat ABIs.    The patient was seen and examined with Dr. Donzetta Sprung, MD PI # [phone], vascular surgery attending of record, who is in agreement with this  assessment and plan.    Sharlene Dory DO PGY-3  Vascular Surgery  [phone]    ATTENDING PHYSICIAN ATTESTATION:    I have seen and evaluated Kenneth Gordon and the care plan was developed with the resident.  I agree with the assessment and plan as outlined in the resident's note.    Donzetta Sprung, M.D.  Professor and Stage manager, Division of Vascular Surgery  [facility]  [address]  [place], [place]  [address]  [phone]

## 2017-10-20 NOTE — Telephone Encounter (Signed)
Last refill;10/10/17  Last seen:10/16/17

## 2017-10-23 LAB — IMMUNOGLOBULIN E: IMMUNOGLOBULIN E: 75.8 kU/L — AB (ref 0.0–24.9)

## 2017-10-27 ENCOUNTER — Ambulatory Visit: Payer: Medicare Other | Admitting: Vascular Surgery

## 2017-10-27 ENCOUNTER — Ambulatory Visit: Payer: Medicare Other

## 2017-10-27 ENCOUNTER — Encounter: Payer: Self-pay | Admitting: Internal Medicine

## 2017-10-27 NOTE — Telephone Encounter (Signed)
From: Renard Hamperobert G Taddei  To: Carlyon ShadowSchivo, Michael, MD  Sent: 10/25/2017 4:43 PM PDT  Subject: Test Result Question    Hi    I just saw my IMMUNOGLOBULIN E test which showed the results were high. What does that mean and should I be concerned.    Thanks in Advance.    Joanna Puffobert Calandro

## 2017-10-30 ENCOUNTER — Encounter: Payer: Self-pay | Admitting: Family Medicine

## 2017-10-30 DIAGNOSIS — F17200 Nicotine dependence, unspecified, uncomplicated: Principal | ICD-10-CM

## 2017-10-30 NOTE — Telephone Encounter (Signed)
From: Renard Hamperobert G Daigneault  To: [name], [name], DO  Sent: 10/30/2017 4:31 AM PDT  Subject: Referral Question    Hi Dr Derrek GuActon    With all three of my doctors wanting me to quit smoking and Dr Candice CampMel's news that the blood flow in left leg is restricted it is time. What is the procedure to getting on the Chantix prescription program to quit. I want to schedule a quit date of two weeks after starting the program.    Thanks in Advance.    Ilda MoriBob Bene

## 2017-11-03 ENCOUNTER — Other Ambulatory Visit: Payer: Self-pay | Admitting: FAMILY PRACTICE

## 2017-11-03 ENCOUNTER — Other Ambulatory Visit: Payer: Self-pay | Admitting: Family Medicine

## 2017-11-03 DIAGNOSIS — J410 Simple chronic bronchitis: Principal | ICD-10-CM

## 2017-11-03 NOTE — Telephone Encounter (Signed)
Last office visit: 10/16/17

## 2017-11-03 NOTE — Telephone Encounter (Signed)
Last rf 05/13/17, last ov 10/16/17.

## 2017-11-05 MED ORDER — VARENICLINE TARTRATE 0.5 MG (11)-1 MG (42) TABLETS IN A DOSE PACK
ORAL_TABLET | ORAL | 3 refills | Status: AC
Start: 2017-11-05 — End: 2018-10-31

## 2017-11-05 MED ORDER — VARENICLINE TARTRATE 1 MG TABLET
1.0000 mg | ORAL_TABLET | Freq: Two times a day (BID) | ORAL | 3 refills | Status: DC
Start: 2017-11-05 — End: 2018-04-23

## 2017-11-14 ENCOUNTER — Other Ambulatory Visit: Payer: Self-pay | Admitting: Family Medicine

## 2017-11-14 DIAGNOSIS — J410 Simple chronic bronchitis: Principal | ICD-10-CM

## 2017-11-14 NOTE — Telephone Encounter (Signed)
Last refill:11/03/17  Last seen:07/17/17

## 2017-11-23 ENCOUNTER — Other Ambulatory Visit: Payer: Self-pay | Admitting: Family Medicine

## 2017-11-23 DIAGNOSIS — J410 Simple chronic bronchitis: Principal | ICD-10-CM

## 2017-11-25 NOTE — Telephone Encounter (Signed)
Last office visit: 10/16/17

## 2017-11-28 ENCOUNTER — Ambulatory Visit: Payer: Self-pay

## 2017-11-28 ENCOUNTER — Other Ambulatory Visit: Payer: Self-pay | Admitting: Family Medicine

## 2017-11-28 ENCOUNTER — Telehealth: Payer: Self-pay

## 2017-11-28 DIAGNOSIS — I724 Aneurysm of artery of lower extremity: Principal | ICD-10-CM

## 2017-11-28 DIAGNOSIS — J410 Simple chronic bronchitis: Principal | ICD-10-CM

## 2017-11-28 NOTE — Telephone Encounter (Signed)
Patient was on certified list:  Per notes on 10/20/17 from Dr. Donzetta Sprung, Vascular surgery patient is to stop warfarin and start aspirin 81 mg. Please deactivate patient from active anti-coag list.      Marden Noble, [facility]  [facility]  Ph: [phone]   Fx: [phone]

## 2017-11-28 NOTE — Telephone Encounter (Signed)
Filled Rx last 11/14/17

## 2017-11-28 NOTE — Progress Notes (Signed)
This notice is to inform you that your patient stopped warfarin per notes on 10/20/17 from Dr. Donzetta Sprung, Vascular surgery patient is to stop warfarin and start aspirin 81 mg. Your patient has now been discharged from the Anticoagulation Clinic. Please feel free to contact us with any questions.  Called pharmacy on file to cancel remaining refills.   Anticoagulation Clinic  [phone]

## 2017-11-28 NOTE — Telephone Encounter (Signed)
Done, thank you.    Hollace Hayward, Pharm. D., APh, BCPS  Amb[facility] Pharm[facility]

## 2017-11-30 ENCOUNTER — Encounter: Payer: Self-pay | Admitting: Family Medicine

## 2017-11-30 DIAGNOSIS — J410 Simple chronic bronchitis: Principal | ICD-10-CM

## 2017-12-01 ENCOUNTER — Encounter: Payer: Self-pay | Admitting: Internal Medicine

## 2017-12-01 ENCOUNTER — Encounter: Payer: Self-pay | Admitting: Family Medicine

## 2017-12-01 DIAGNOSIS — J410 Simple chronic bronchitis: Principal | ICD-10-CM

## 2017-12-01 MED ORDER — IPRATROPIUM 20 MCG-ALBUTEROL 100 MCG/ACTUATION MIST FOR INHALATION
1.0000 | Freq: Four times a day (QID) | RESPIRATORY_TRACT | 3 refills | Status: DC
Start: 2017-12-01 — End: 2018-08-27

## 2017-12-01 MED ORDER — IPRATROPIUM 20 MCG-ALBUTEROL 100 MCG/ACTUATION MIST FOR INHALATION
1.0000 | RESPIRATORY_TRACT | 0 refills | Status: DC | PRN
Start: 2017-12-01 — End: 2018-09-21

## 2017-12-01 NOTE — Addendum Note (Signed)
Addended by: Carlyon Shadow on: 12/01/2017 05:39 PM     Modules accepted: Orders

## 2017-12-01 NOTE — Telephone Encounter (Signed)
Last refilled 11-14-17 . Last office visit 10-16-17

## 2017-12-01 NOTE — Telephone Encounter (Signed)
From: [name]  To: Carlyon Shadow, MD  Sent: 11/30/2017 10:58 AM PDT  Subject: MyChart Refill Request    Hi DrMarland Kitchen Rolene Arbour    I am in urgent need of a refill for my Combivent inhaler. There seems to be a problem with refilling the prescription.     Thanks in Advance.    Kenneth Gordon

## 2017-12-01 NOTE — Addendum Note (Signed)
Addended by: Berneda Rose on: 12/01/2017 09:04 AM     Modules accepted: Orders

## 2017-12-01 NOTE — Telephone Encounter (Signed)
Patient states he is needing another refill of combivent. Patient picked up a refill on 11/14/17 and stated he needed to use it more so now he is out. Insurance may not cover the cost     [employer], [place]

## 2017-12-01 NOTE — Telephone Encounter (Signed)
From: [name]  To: Lorrin Jackson, DO  Sent: 11/29/2017 5:01 PM PDT  Subject: MyChart Refill Request    Hi Dr Derrek Gu    Why was my refill for Combivent denied?     [name]

## 2017-12-09 ENCOUNTER — Other Ambulatory Visit: Payer: Self-pay | Admitting: Family Medicine

## 2017-12-09 DIAGNOSIS — Z1211 Encounter for screening for malignant neoplasm of colon: Principal | ICD-10-CM

## 2017-12-31 ENCOUNTER — Encounter: Payer: Self-pay | Admitting: Vascular Surgery

## 2017-12-31 DIAGNOSIS — I724 Aneurysm of artery of lower extremity: Principal | ICD-10-CM

## 2017-12-31 NOTE — Telephone Encounter (Signed)
Patient emailed Dr. Waymon Budge.  C/o of leg pain and requesting to see Dr Waymon Budge.   Ordered VL ABI prior to the appointment.     Will have scheduler contact the patient to set up the study and appointment.     Pearlean Brownie, RN

## 2017-12-31 NOTE — Telephone Encounter (Signed)
From: [name]  To: Mell, Suzie Portela, MD  Sent: 12/31/2017 3:02 PM PDT  Subject: Non-urgent Medical Advice Question    Hi Dr. Waymon Budge    Well, last night I started to have pain in my lower left leg. Really the first time since the last time I saw you. Also, i am getting a tingling sensation in my toes off and on. Nothing painful. But you did say that I would be experiencing pain in my leg.     Before this becomes painful all the time should I make an appointment to see you or have some test scheduled?    Thanks in Advance.    Ilda Mori

## 2018-01-02 ENCOUNTER — Ambulatory Visit
Admission: RE | Admit: 2018-01-02 | Discharge: 2018-01-02 | Disposition: A | Discharge status: Home / Self Care | Payer: Medicare Other | Source: Ambulatory Visit | Attending: Surgery | Admitting: Surgery

## 2018-01-02 DIAGNOSIS — I724 Aneurysm of artery of lower extremity: Principal | ICD-10-CM | POA: Insufficient documentation

## 2018-01-02 DIAGNOSIS — I739 Peripheral vascular disease, unspecified: Secondary | ICD-10-CM

## 2018-01-05 ENCOUNTER — Ambulatory Visit: Payer: Medicare Other | Attending: Vascular Surgery | Admitting: Vascular Surgery

## 2018-01-05 VITALS — BP 116/68 | HR 88 | Temp 97.9°F | Resp 22 | Wt 173.9 lb

## 2018-01-05 DIAGNOSIS — I739 Peripheral vascular disease, unspecified: Principal | ICD-10-CM | POA: Insufficient documentation

## 2018-01-05 DIAGNOSIS — Z95828 Presence of other vascular implants and grafts: Secondary | ICD-10-CM | POA: Insufficient documentation

## 2018-01-05 DIAGNOSIS — F17219 Nicotine dependence, cigarettes, with unspecified nicotine-induced disorders: Secondary | ICD-10-CM | POA: Insufficient documentation

## 2018-01-05 DIAGNOSIS — T82868D Thrombosis of vascular prosthetic devices, implants and grafts, subsequent encounter: Secondary | ICD-10-CM | POA: Insufficient documentation

## 2018-01-05 DIAGNOSIS — J45909 Unspecified asthma, uncomplicated: Secondary | ICD-10-CM

## 2018-01-05 NOTE — Progress Notes (Deleted)
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  He was last seen in clinic on 10/20/17 with Dr. Waymon Budge, he was asymptomatic at that visit despite Duplex imaging which demonstrated total occlusion of his popliteal stent. He presented to the clinic today as he suffered an episode of left leg pain at night and he wanted to check that he was still doing fine from a vascular stand point.    He reports that last week he suffered a 3-4 minute episode of severe 10/10 pain in his left calf that awoke him in the middle of the night. He says that the pain resolved after he stood up and walked around his house. He has not had any other prior episodes similar to this one. He also mentions that he has had numbness of his left toes, that is worse at night, and better when standing, and that this symptoms has been present for several months. He has several other complaints that he would like to discuss during the visit including that his right arm has "fallen asleep for no reason" twice at night since his last visit, and that he feels dizzy when lying on his back, but not when lying on his side. He denies any weakness or pain when using his right arm. We briefly discussed these complaints, but focused this visit on his new onset leg pain.     He reports that he has been free from claudication since his first stenting procedure despite recurrent thrombosis. He denies having rest pain or any non-healing wounds, although his extremities are covered with psoriatic lesions which may increase his risk for developing a non-healing wound.  He continues to smoke daily, although he has cut back significantly recently. He is now smoking 2 cigarettes per day (down from  10-20 per day) with the help of Chantix. He states that his cravings have decreased and that his stop date is tomorrow.     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    Current Outpatient Medications on File Prior to Visit   Medication Sig Dispense Refill    Albuterol (ACCUNEB) 1.25 mg/3 mL nebulizer solution Take 3 mL by inhalation every 4 hours if needed. 30 vial 3    Albuterol (ACCUNEB) 1.25 mg/3 mL nebulizer solution INHALE THE CONTENTS OF 1 VIAL(3 ML) VIA NEBULIZER BY MOUTH EVERY 4 HOURS AS NEEDED 75 mL 0    Albuterol/Ipratropium (COMBIVENT RESPIMAT) 20-100 mcg/actuation Inhaler Take 1 puff by inhalation every 4 hours if needed. 4 g 0    Albuterol/Ipratropium (COMBIVENT RESPIMAT) 20-100 mcg/actuation Inhaler Take 1 puff by inhalation 4 times daily. 3 inhaler 3    Aspirin 81 mg Chewable Tablet Take 81 mg by mouth every day.      Atorvastatin (LIPITOR) 40 mg tablet Take 1 tablet by mouth every day. 90 tablet 3  Azelastine Nasal (ASTELIN) 137 mcg (0.1 %) Spray Instill 2 sprays into EACH nostril 2 times daily. 1 bottle 0    Budesonide/Formoterol (SYMBICORT) 160-4.5 mcg/actuation Inhaler Take 2 puffs by inhalation 2 times daily. 1 inhaler 11    Fluticasone (FLONASE) 50 mcg/actuation nasal spray Instill 1-2 sprays into EACH nostril every day. (allergies) 16 g 5    Lisinopril (PRINIVIL, ZESTRIL) 20 mg Tablet TAKE 1 TABLET BY MOUTH EVERY DAY FOR BLOOD PRESSURE 90 tablet 0    PredniSONE (DELTASONE) 20 mg Tablet Take 2 tablets by mouth every day. take 2  tablets by mouth once daily for 5 days 10 tablet 0    Tiotropium (SPIRIVA RESPIMAT) 2.5 mcg/actuation Inhaler Take 2 sprays by inhalation every day. 4 g 11    Varenicline (CHANTIX CONTINUING MONTH BOX) 1 mg Tablet Take 1 tablet by mouth 2 times daily. 56 tablet 3    Varenicline (CHANTIX STARTING MONTH BOX) 0.5 mg (11)- 1 mg (42) tablet Take 0.5 mg tablet daily for 3 days, then take 0.5 mg tablet twice daily for 4 days, then take 1 mg tablet twice daily. 53 tablet 3     No current facility-administered medications on file prior to visit.        No Known Allergies    Review of Systems    Physical Exam:  Temp: 36.6 C (97.9 F) (10/14 1311)  Temp src: Temporal (10/14 1311)  Pulse: 88 (10/14 1311)  BP: 116/68 (10/14 1312)  Resp: 22 (10/14 1311)  SpO2: --  Height: --  Weight: 78.9 kg (173 lb 15.1 oz) (10/14 1311)    Physical Exam   Constitutional: He is oriented to person, place, and time. He appears well-developed and well-nourished.   HENT:   Head: Normocephalic and atraumatic.   Eyes: Pupils are equal, round, and reactive to light. EOM are normal.   Neck: Normal range of motion. No tracheal deviation present.   Cardiovascular: Normal rate and regular rhythm.   Pulmonary/Chest: Effort normal and breath sounds normal.   Abdominal: Soft. He exhibits no distension.   Musculoskeletal: Normal range of motion. He exhibits no edema or deformity.   Neurological: He is alert and oriented to person, place, and time.   Skin: Skin is warm and dry. Rash noted.   Psoriatic skin lesions affecting bother lower and upper extremities.    Psychiatric: He has a normal mood and affect. His behavior is normal.   Extremities:   - RLE: 2+ DP/PT, femoral arteries.   - LLE: Doppler signals DP/PT, 2+ femoral arteries.       Imaging  Arterial Duplex 01/05/18  Findings:    RIGHT      Pressure Index   Brach A       142       Dorsalis Pedis    154  1.08   PTA         169  1.19   Great Toe      112  0.79      LEFT      Pressure Index   Brach A       139       Dorsalis Pedis    84  0.59   PTA          79  0.56   Great Toe       50  0.35     RIGHT:  Triphasic arterial Doppler waveforms are obtained from the anterior tibial and posterior tibial   arteries.  Sharp systolic upstroke with the presence of a dichrotic notch from photoplethysmographic  arterial waveforms of the great toe.    LEFT:  Dampened, monophasic arterial Doppler waveforms are obtained from the anterior tibial and posterior   tibial arteries. Delayed and dampened systolic upstroke with rounded peaks demonstrated from   photoplethysmographic arterial waveforms of the great toe.      Previous Resting Ankle/Arm and Toe/Arm Ratio:   Previous (10/20/2017) RIGHT LEFT  Ankle/arm ratio: 1.04 0.51   Toe/arm ratio: 0.8 0.24      IMPRESSION:  1. NORMAL ARTERIAL PERFUSION OF THE RIGHT LOWER EXTREMITY.    2. MODERATE ARTERIAL INSUFFICIENCY OF THE LEFT LOWER EXTREMITY.    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  He was last seen in clinic on 10/20/17 with Dr. Waymon Budge, he was asymptomatic at that visit despite Duplex imaging which demonstrated total occlusion of his popliteal stent. Since his last visit he suffered an episode of severe LLE pain in his calf that awoke him in the middle of the night resolved with walking. He denies claudication, rest pain, or non-healing wounds. ABIs today are slightly improved from his last visit. We feel that his episode of LLE pain is unlikely to be secondary to his occluded LLE popliteal stent.     Plan:  - Follow up with scheduled ABI's and arterial Duplex 6 months from his last visit (10/20/17)  - Continue ASA 81 and Atorvastatin 40.    The patient was seen and examined with Dr. Waymon Budge, vascular surgery  attending of record, who is in agreement with this assessment and plan.      Cherly Beach, Med Student   Vascular Surgery Service Pager: [phone]

## 2018-01-05 NOTE — Nursing Note (Signed)
Vital signs taken, allergies verified, screened for pain,Trishia Cuthrell Cooks, MA II

## 2018-01-05 NOTE — Progress Notes (Signed)
01/05/2018    Lorrin Jackson, DO  [address]  [facility]  [place], [place] [address]    Dear Derrek Gu, Noreene Larsson, DO,    I had the distinct pleasure of seeing Kenneth Gordon in the [facility] for follow up of peripheral vascular disease.    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  He was last seen in clinic on 10/20/17 with Dr. Waymon Budge, he was asymptomatic at that visit despite Duplex imaging which demonstrated total occlusion of his popliteal stent. He presented to the clinic today as he suffered an episode of left leg pain at night and he wanted to check that he was still doing fine from a vascular stand point.    He reports that last week he suffered a 3-4 minute episode of severe 10/10 pain in his left calf that awoke him in the middle of the night. He says that the pain resolved after he stood up and walked around his house. He has not had any other prior episodes similar to this one. He also mentions that he has had numbness of his left toes, that is worse at night, and better when standing, and that this symptoms has been present for several months. He has several other complaints that he would like to discuss during the visit including that his right arm has "fallen asleep for no reason" twice at night since his last visit, and that he feels dizzy when lying on his back, but not when lying on his side. He denies any weakness or pain when using his right arm. We briefly discussed these complaints, but focused this visit on his new onset leg pain.     He reports that he has been free from claudication since his first stenting procedure despite recurrent thrombosis. He denies having rest pain or any non-healing wounds, although his extremities are covered with psoriatic lesions which may increase his  risk for developing a non-healing wound.  He continues to smoke daily, although he has cut back significantly recently. He is now smoking 2 cigarettes per day (down from 10-20 per day) with the help of Chantix. He states that his cravings have decreased and that his stop date is tomorrow.     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    Current Outpatient Medications on File Prior to Visit   Medication Sig Dispense Refill    Albuterol (ACCUNEB) 1.25 mg/3 mL nebulizer solution Take 3 mL by inhalation every 4 hours if needed. 30 vial 3    Albuterol (ACCUNEB) 1.25 mg/3 mL nebulizer solution INHALE THE CONTENTS OF 1 VIAL(3 ML) VIA NEBULIZER BY MOUTH EVERY 4 HOURS AS NEEDED 75 mL 0    Albuterol/Ipratropium (COMBIVENT RESPIMAT) 20-100 mcg/actuation Inhaler Take 1 puff by inhalation every 4 hours if needed. 4 g 0    Albuterol/Ipratropium (COMBIVENT RESPIMAT) 20-100 mcg/actuation Inhaler Take 1 puff by inhalation 4 times daily. 3 inhaler  3    Aspirin 81 mg Chewable Tablet Take 81 mg by mouth every day.      Atorvastatin (LIPITOR) 40 mg tablet Take 1 tablet by mouth every day. 90 tablet 3    Azelastine Nasal (ASTELIN) 137 mcg (0.1 %) Spray Instill 2 sprays into EACH nostril 2 times daily. 1 bottle 0    Budesonide/Formoterol (SYMBICORT) 160-4.5 mcg/actuation Inhaler Take 2 puffs by inhalation 2 times daily. 1 inhaler 11    Fluticasone (FLONASE) 50 mcg/actuation nasal spray Instill 1-2 sprays into EACH nostril every day. (allergies) 16 g 5    Lisinopril (PRINIVIL,  ZESTRIL) 20 mg Tablet TAKE 1 TABLET BY MOUTH EVERY DAY FOR BLOOD PRESSURE 90 tablet 0    PredniSONE (DELTASONE) 20 mg Tablet Take 2 tablets by mouth every day. take 2 tablets by mouth once daily for 5 days 10 tablet 0    Tiotropium (SPIRIVA RESPIMAT) 2.5 mcg/actuation Inhaler Take 2 sprays by inhalation every day. 4 g 11    Varenicline (CHANTIX CONTINUING MONTH BOX) 1 mg Tablet Take 1 tablet by mouth 2 times daily. 56 tablet 3    Varenicline (CHANTIX STARTING MONTH BOX) 0.5 mg (11)- 1 mg (42) tablet Take 0.5 mg tablet daily for 3 days, then take 0.5 mg tablet twice daily for 4 days, then take 1 mg tablet twice daily. 53 tablet 3     No current facility-administered medications on file prior to visit.        No Known Allergies    Review of Systems    Physical Exam:  Temp: 36.6 C (97.9 F) (10/14 1311)  Temp src: Temporal (10/14 1311)  Pulse: 88 (10/14 1311)  BP: 116/68 (10/14 1312)  Resp: 22 (10/14 1311)  SpO2: --  Height: --  Weight: 78.9 kg (173 lb 15.1 oz) (10/14 1311)    Physical Exam   Constitutional: He is oriented to person, place, and time. He appears well-developed and well-nourished.   HENT:   Head: Normocephalic and atraumatic.   Eyes: Pupils are equal, round, and reactive to light. EOM are normal.   Neck: Normal range of motion. No tracheal deviation present.   Cardiovascular: Normal rate and regular rhythm.   Pulmonary/Chest: Effort normal and breath sounds normal.   Abdominal: Soft. He exhibits no distension.   Musculoskeletal: Normal range of motion. He exhibits no edema or deformity.   Neurological: He is alert and oriented to person, place, and time.   Skin: Skin is warm and dry. Rash noted.   Psoriatic skin lesions affecting bother lower and upper extremities.    Psychiatric: He has a normal mood and affect. His behavior is normal.   Extremities:   - RLE: 2+ DP/PT, femoral arteries.   - LLE: Doppler signals DP/PT, 2+ femoral arteries.       Imaging  Arterial Duplex 01/05/18  Findings:    RIGHT       Pressure Index   Brach A       142       Dorsalis Pedis    154  1.08   PTA         169  1.19   Great Toe      112  0.79     LEFT      Pressure Index   Brach A       139       Dorsalis Pedis    84  0.59   PTA  79  0.56   Great Toe       50  0.35     RIGHT:  Triphasic arterial Doppler waveforms are obtained from the anterior tibial and posterior tibial   arteries. Sharp systolic upstroke with the presence of a dichrotic notch from photoplethysmographic  arterial waveforms of the great toe.    LEFT:  Dampened, monophasic arterial Doppler waveforms are obtained from the anterior tibial and posterior   tibial arteries. Delayed and dampened systolic upstroke with rounded peaks demonstrated from   photoplethysmographic arterial waveforms of the great toe.      Previous Resting Ankle/Arm and Toe/Arm Ratio:   Previous (10/20/2017) RIGHT LEFT  Ankle/arm ratio: 1.04 0.51   Toe/arm ratio: 0.8 0.24      IMPRESSION:  1. NORMAL ARTERIAL PERFUSION OF THE RIGHT LOWER EXTREMITY.    2. MODERATE ARTERIAL INSUFFICIENCY OF THE LEFT LOWER EXTREMITY.    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  He was last seen in clinic on 10/20/17 with Dr. Waymon Budge, he was asymptomatic at that visit despite Duplex imaging which demonstrated total occlusion of his popliteal stent. Since his last visit he suffered an episode of severe LLE pain in his calf that awoke him in the middle of the night resolved with walking. He denies claudication, rest pain, or non-healing wounds. ABIs today are slightly improved from his last visit. We feel that his episode of LLE pain is unlikely to be secondary to his occluded LLE popliteal stent.     Plan:  - Follow up with scheduled ABI's and  arterial Duplex 6 months from his last visit (10/20/17)  - Continue ASA 81 and Atorvastatin 40.    Donzetta Sprung, M.D.  Professor and Stage manager, Division of Vascular Surgery  [facility]  [address]  [place], [place]  [address]  [phone]

## 2018-01-06 ENCOUNTER — Other Ambulatory Visit: Payer: Self-pay | Admitting: Family Medicine

## 2018-01-06 DIAGNOSIS — L409 Psoriasis, unspecified: Principal | ICD-10-CM

## 2018-01-07 ENCOUNTER — Ambulatory Visit: Payer: Medicare Other

## 2018-01-07 NOTE — Telephone Encounter (Addendum)
Last refill 05/09/17, says discontinued. Last ov 10/16/17.

## 2018-01-08 ENCOUNTER — Encounter: Payer: Self-pay | Admitting: Family Medicine

## 2018-01-08 DIAGNOSIS — J410 Simple chronic bronchitis: Principal | ICD-10-CM

## 2018-01-09 ENCOUNTER — Other Ambulatory Visit: Payer: Self-pay | Admitting: Family Medicine

## 2018-01-09 DIAGNOSIS — J410 Simple chronic bronchitis: Principal | ICD-10-CM

## 2018-01-09 MED ORDER — ALBUTEROL SULFATE 1.25 MG/3 ML SOLUTION FOR NEBULIZATION
1.2500 mg | INHALATION_SOLUTION | RESPIRATORY_TRACT | 3 refills | Status: DC | PRN
Start: 2018-01-09 — End: 2018-01-13

## 2018-01-09 NOTE — Telephone Encounter (Signed)
Last refill 11/25/17, last ov 10/16/17.

## 2018-01-13 ENCOUNTER — Encounter: Payer: Self-pay | Admitting: Family Medicine

## 2018-01-13 DIAGNOSIS — J410 Simple chronic bronchitis: Principal | ICD-10-CM

## 2018-01-14 ENCOUNTER — Encounter: Payer: Self-pay | Admitting: Family Medicine

## 2018-01-14 DIAGNOSIS — J410 Simple chronic bronchitis: Principal | ICD-10-CM

## 2018-01-14 MED ORDER — ALBUTEROL SULFATE 1.25 MG/3 ML SOLUTION FOR NEBULIZATION
1.2500 mg | INHALATION_SOLUTION | RESPIRATORY_TRACT | 3 refills | Status: DC | PRN
Start: 2018-01-14 — End: 2018-05-04

## 2018-01-14 NOTE — Telephone Encounter (Signed)
Patient last seen 10/16/17    Last refilled  01/09/18

## 2018-01-15 ENCOUNTER — Encounter: Payer: Self-pay | Admitting: Family Medicine

## 2018-01-15 NOTE — Telephone Encounter (Signed)
From: [name]  To: Lorrin Jackson, DO  Sent: 01/14/2018 7:13 PM PDT  Subject: MyChart Refill Request    [facility] still doesn't have my RX for Albuterol 1.25 mg/3 mL nebulizer solution    Ilda Mori

## 2018-01-15 NOTE — Telephone Encounter (Signed)
Last refilled 01-15-18 . Last office visit 10-16-17

## 2018-01-16 ENCOUNTER — Encounter: Payer: Self-pay | Admitting: Family Medicine

## 2018-01-16 ENCOUNTER — Other Ambulatory Visit: Payer: Self-pay | Admitting: Family Medicine

## 2018-01-16 ENCOUNTER — Ambulatory Visit (INDEPENDENT_AMBULATORY_CARE_PROVIDER_SITE_OTHER): Payer: Medicare Other

## 2018-01-16 ENCOUNTER — Ambulatory Visit: Payer: Medicare Other | Attending: Family Medicine | Admitting: Family Medicine

## 2018-01-16 VITALS — BP 132/79 | HR 83 | Temp 97.9°F | Resp 22 | Ht 74.0 in | Wt 175.3 lb

## 2018-01-16 DIAGNOSIS — Z23 Encounter for immunization: Secondary | ICD-10-CM | POA: Insufficient documentation

## 2018-01-16 DIAGNOSIS — Z1159 Encounter for screening for other viral diseases: Principal | ICD-10-CM | POA: Insufficient documentation

## 2018-01-16 DIAGNOSIS — L409 Psoriasis, unspecified: Secondary | ICD-10-CM | POA: Insufficient documentation

## 2018-01-16 DIAGNOSIS — J410 Simple chronic bronchitis: Secondary | ICD-10-CM | POA: Insufficient documentation

## 2018-01-16 DIAGNOSIS — I70202 Unspecified atherosclerosis of native arteries of extremities, left leg: Secondary | ICD-10-CM | POA: Insufficient documentation

## 2018-01-16 DIAGNOSIS — F17200 Nicotine dependence, unspecified, uncomplicated: Secondary | ICD-10-CM

## 2018-01-16 DIAGNOSIS — Z1211 Encounter for screening for malignant neoplasm of colon: Secondary | ICD-10-CM | POA: Insufficient documentation

## 2018-01-16 DIAGNOSIS — F17210 Nicotine dependence, cigarettes, uncomplicated: Secondary | ICD-10-CM | POA: Insufficient documentation

## 2018-01-16 LAB — HEPATITIS C AB SCREEN: HEPATITIS C AB SCREEN: NONREACTIVE

## 2018-01-16 NOTE — Telephone Encounter (Signed)
Last office visit: 01/16/2018

## 2018-01-16 NOTE — Progress Notes (Signed)
Chief Complaint: Follow up COPD    Kenneth Gordon is a 72yr old malewith history of COPD, asthmaand tobacco dependencewho presents for3 month follow up.     Reports he will still get get occasionalincreased work of breathing intermittently throughout the day usually in the morning when he wakes.He canwake in the morning and feel "a weight" in his chest that wouldgradually improve but not completely go away. He notes associated nasal congestion, runny nose and PND and throat clearing. The above symptoms have improved after pulmonary consult and medication optimization with respimat style inhalers.    Saw Pulmonology 10/14/17 who have him on Symbicort, Spiriva respimat and Combivent respimat for better ease of use. No using flonase currently.    Denies fever, chills, congestion.No change to color or amount of morning sputum. No hemoptysis.    Today he reports worsening of his psoriasis. Typically uses Clobetasol but this is not working like it used to and now his plaques have expanded beyond his elbows to his knees and back.      REVIEW OF SYSTEMS:  Constitutional: negative.  CV: negative.  Resp: negative.  GI: negative.  GU: negative.  Integumentary: rash.    I did review patient's past medical and family/social history, no changes noted.    CURRENT MEDICATIONS:  Current Outpatient Medications   Medication Sig Dispense Refill    Albuterol (ACCUNEB) 1.25 mg/3 mL nebulizer solution Take 3 mL by inhalation every 4 hours if needed. 30 vial 3    Albuterol/Ipratropium (COMBIVENT RESPIMAT) 20-100 mcg/actuation Inhaler Take 1 puff by inhalation every 4 hours if needed. 4 g 0    Albuterol/Ipratropium (COMBIVENT RESPIMAT) 20-100 mcg/actuation Inhaler Take 1 puff by inhalation 4 times daily. 3 inhaler 3    Aspirin 81 mg Chewable Tablet Take 81 mg by mouth every day.      Atorvastatin (LIPITOR) 40 mg tablet Take 1 tablet by mouth every day. 90 tablet 3    Azelastine Nasal (ASTELIN) 137 mcg (0.1 %) Spray  Instill 2 sprays into EACH nostril 2 times daily. 1 bottle 0    Budesonide/Formoterol (SYMBICORT) 160-4.5 mcg/actuation Inhaler Take 2 puffs by inhalation 2 times daily. 1 inhaler 11    Clobetasol (TEMOVATE) 0.05 % Ointment APPLY SPARINGLY EXTERNALLY TO THE AFFECTED AREA TWICE DAILY 60 g 0    Fluticasone (FLONASE) 50 mcg/actuation nasal spray Instill 1-2 sprays into EACH nostril every day. (allergies) 16 g 5    Lisinopril (PRINIVIL, ZESTRIL) 20 mg Tablet TAKE 1 TABLET BY MOUTH EVERY DAY FOR BLOOD PRESSURE 90 tablet 0    PredniSONE (DELTASONE) 20 mg Tablet Take 2 tablets by mouth every day. take 2 tablets by mouth once daily for 5 days 10 tablet 0    Tiotropium (SPIRIVA RESPIMAT) 2.5 mcg/actuation Inhaler Take 2 sprays by inhalation every day. 4 g 11    Varenicline (CHANTIX CONTINUING MONTH BOX) 1 mg Tablet Take 1 tablet by mouth 2 times daily. 56 tablet 3    Varenicline (CHANTIX STARTING MONTH BOX) 0.5 mg (11)- 1 mg (42) tablet Take 0.5 mg tablet daily for 3 days, then take 0.5 mg tablet twice daily for 4 days, then take 1 mg tablet twice daily. 53 tablet 3     No current facility-administered medications for this visit.        PHYSICAL EXAM:  BP 132/79 (SITE: left arm, Orthostatic Position: sitting, Cuff Size: regular)   Pulse 83   Temp 36.6 C (97.9 F) (Tympanic)   Resp 22  Ht 1.88 m (6\' 2" )   Wt 79.5 kg (175 lb 4.3 oz)   SpO2 95%   BMI 22.50 kg/m   General Appearance: healthy, alert, no distress, pleasant affect, cooperative.  Neck:  Neck supple. No adenopathy, thyroid symmetric, normal size.  Heart:  normal rate and regular rhythm, no murmurs, clicks, or gallops.  Lungs: clear to auscultation.  Extremities:  no cyanosis, clubbing, or edema.  Skin:  plaque - arms, back and lower legs.  Neuro: Gait normal. Reflexes normal and symmetric. Sensation and strength grossly normal.         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, left (HCC)  Comment: Vascular following every 6 months  Plan: Coumadin indefinitely    (F17.200) Tobacco dependence  Comment: Down to 4-5 cigarettes per day  Plan: Continues Chantix    (Z23) Need for influenza vaccination  Comment: Due  Plan: INFLUENZA VACCINE (71 YO AND OLDER), HIGH-DOSE,        TRIVALENT, NO LATEX, NO PRESERVATIVE        Ordered    (Z12.11) Colon cancer screening  Comment: Due   Plan: FECAL BY IMMUNOASSAY (FIT)        Ordered    (L40.9) Psoriasis  Comment: Multiple sites; refractory to Clobetasol  Plan: DERMATOLOGY CLINIC REFERRAL        Referral to optimize treatment          Barriers to Learning assessed: none. Patient verbalizes understanding of teaching and instructions.

## 2018-01-16 NOTE — Nursing Note (Signed)
Two identifiers used, vital signs taken, allergies verified, screened for pain, med hx taken.  I have asked the patient if they have received any medical treatment or consultation outside of the [facility].  The patient has indicated (no) to receiving consults or services outside the [facility].  If yes, we have requested information to ascertain these results.    Samir Ishaq MA II

## 2018-01-16 NOTE — Nursing Note (Signed)
The Influenza Vaccine VIS document for the flu injection was given to patient to review. Patient or person named in permission has answered no to any history of egg allergy, previous serious reaction to a influenza vaccine or current illness which would preclude them receiving an immunization. Any questions were referred to the physician. The Influenza Vaccine was then administered per protocol or by Policy 2041 using the standing order from Dr. James Kirk (Academic) or Dr. Kurt Slapnik (Network).   The patient was observed for immediate reactions to the vaccine per protocol. None were observed.   Kenneth Gordon

## 2018-02-02 ENCOUNTER — Other Ambulatory Visit: Payer: Self-pay | Admitting: Family Medicine

## 2018-02-03 ENCOUNTER — Encounter: Payer: Self-pay | Admitting: Family Medicine

## 2018-02-03 NOTE — Telephone Encounter (Signed)
Last refill 11/03/17, last ov 01/16/18.

## 2018-02-03 NOTE — Telephone Encounter (Signed)
From: Renard Hamperobert G Yett  To: [name], DO  Sent: 01/30/2018 4:58 PM PST  Subject: Visit Follow-up Question    Hi     I got an appointment with the Dermatologist on 12/5 which is great except the psoriasis has spread to my hands. It is on my fingers, finger tips and palms. i can't get an earlier appointment so what can I do.    Thanks in advance.    Joanna Puffobert Chalk

## 2018-02-07 ENCOUNTER — Other Ambulatory Visit: Payer: Self-pay | Admitting: Family Medicine

## 2018-02-07 DIAGNOSIS — J410 Simple chronic bronchitis: Principal | ICD-10-CM

## 2018-02-09 NOTE — Telephone Encounter (Signed)
Last O.V 01/16/18

## 2018-02-09 NOTE — Telephone Encounter (Signed)
Called patient to offer appointment - no answer - appt scheduled - sent mychart for patient to confirm he came make appt for 02/13/18

## 2018-02-13 ENCOUNTER — Encounter: Payer: Self-pay | Admitting: Dermatology

## 2018-02-13 ENCOUNTER — Ambulatory Visit: Payer: Medicare Other | Admitting: Dermatology

## 2018-02-13 VITALS — BP 118/81 | HR 87 | Temp 97.7°F | Wt 175.0 lb

## 2018-02-13 DIAGNOSIS — L409 Psoriasis, unspecified: Principal | ICD-10-CM

## 2018-02-13 NOTE — Nursing Note (Signed)
Two identifiers used, vital signs taken, allergies verified, screened for pain, med hx taken.  I have asked the patient if they have received any medical treatment or consultation outside of the [facility].  The patient has indicated (no) to receiving consults or services outside the [facility].  If yes, we have requested information to ascertain these results.  Flu Vaccine status reviewed and updated as appropriate.      My Chart status reviewed and updated as appropriate.  Jayvon Mounger, MA

## 2018-02-13 NOTE — Progress Notes (Signed)
02/13/2018    I had the pleasure of seeing [name], a 37yr male in consultation.    The patient's chief concern is psoriasis.     The HPI is noted below in the integrated section HPI/Exam/Assessment/Plan      Past Medical History:   History of skin cancer: None    Family History:                                             Skin cancer:None    Social History:  The patient does smoke but does not use tobacco products.    Allergies:  No Known Allergies    Review of Systems: The patient has been asked about current: persistent fevers, night sweats, significant decrease in energy, unintentional weight loss, weakness, dizziness, mouth sores, bleeding gums, visual changes, persistent cough, wheezing, shortness of breath, sinus problems, chest pain, abdominal pain, nausea, vomiting, diarrhea, joint pain, muscle pain, headaches, pain with urination, heat intolerance, cold intolerance, anxiety, depression, excessive bleeding.   Positives are: Significant decrease in energy. All other ROS are negative.     Exam:  BP 118/81 (SITE: left arm, Orthostatic Position: sitting, Cuff Size: regular)   Pulse 87   Temp 36.5 C (97.7 F) (Temporal)   Wt 79.4 kg (175 lb 0.7 oz)   SpO2 100%   BMI 22.47 kg/m   On physical examination, the patient is well developed, well nourished, and pleasant with normal affect. The patient is alert to time, place, and person.    The following areas were inspected  Scalp with palpation, head and face, eyelids and conjunctiva, teeth, gums, tongue, lips, neck, chest (including the breasts and axillae), abdomen, back, buttocks/groin, right and left upper extremities and right and left lower extremities. The digits and nails were inspected and palpated. The lower extremities were inspected for varicosities and palpated for peripheral swelling.    Lymph node basins were palpated in the neck    The findings were normal except for the following pertinent abnormal findings listed in the  integrated HPI/Exam/Assessment/Plan section below    Labs/Imaging reviewed today:   none         HPI/Exam/Assessment/Plan:    # Plaque Psoriasis:   HX: Patient reports hx of psoriasis with pink scaly lesions on extremities and back for many years. Associated with mild intermittent itching and occasional discomfort. Patient has used clobetasol with mild improvement. Denies joint pain.  PE: Multiple well demarcated erythematous round papules and plaques with thick overlying white scale on the forearms, palms, dorsal hands, thighs, knees, legs, dorsal feet, lower back, posterior thighs, buttocks. Total body surface area 20%.   AP: Moderate-Severe predominantly plaque-type psoriasis.  - Given extent of BSA, symptoms and patient distress recommend additional systemic treatment for improved control. Treatment options including phototherapy, MTX, Soriatane, Otezla as well as injectable biologics discussed in detail. Patient denies history of heart failure, lymphoma, serious/active infection, neurological disorders, liver disease or hepatitis B infection. Pt would like to proceed with soriatane.    - Will obtain baseline CBC, CMP, FLP in preparation to start Soriatane. If labs within normal limits will plan to start with 25 mg PO every day. Patient reminded to take with fatty meal for improved absorption. Discussed possibility of combining treatment with NBUVB which may lead to increased improvement. Pt would like to proceed.  - NBUVB 3 treatments  per week to full body. Long term SE including tanning and increased risk of accelerated photoaging and skin cancer discussed.  - Continue Clobetasol ointment 0.05% twice daily. Do not use more than 45 grams of Clobetasol weekly. Continue as needed. Discussed risks of long term use of topical steroids including thinning of the skin, striae, telangiectasias, increased susceptibility to skin infections, allergy to the medication.  - RTc in 3 months      Medication Review: We  discussed risks, benefits, and proper use of dermatological medications with the patient who verbalized understanding.   Education Needs: Education needs were identified. There were no barriers to understanding, and the patient's questions and concerns were addressed.   Handouts Given and Explained: None        The patient was asked to follow up in the clinic in 3 months.     Education needs were addressed and there were no barriers to learning.     Cassie FreerB. Renu Rehal, MD  Associate Physician  Department of Dermatology  [place] [facility] [place],     CC: [name], [name], DO  -------------------------------------------------------------------------------------------------------------------------------------------------------------    SCRIBE STATEMENT  I, Eddie Koc Esau GrewSakazaki, SCRIBE,  am personally taking down the notes in the presence of Dr. Lindwood Quaehal.  Electronically signed by - Reece AgarAmy Makynlie Rossini, SCRIBE, Scribe  02/13/2018  2:07 PM    PROVIDER DISCLAIMER:  This document serves as my personal record of services taken in my presence. It was created on 02/13/2018 on my behalf by the medical scribe noted above.     I have reviewed this document and agree that this note accurately reflects the history and exam findings, the patient care provided, and my medical decision making.     Cassie FreerB. Renu Rehal, MD  Associate Physician  Department of Dermatology  [facility] [place], [facility] [place]

## 2018-02-18 ENCOUNTER — Ambulatory Visit: Payer: Medicare Other | Attending: Dermatology

## 2018-02-18 DIAGNOSIS — L409 Psoriasis, unspecified: Principal | ICD-10-CM | POA: Insufficient documentation

## 2018-02-18 LAB — COMPREHENSIVE METABOLIC PANEL
ALANINE TRANSFERASE (ALT): 14 U/L (ref 6–63)
ALBUMIN: 3.6 g/dL (ref 3.2–4.6)
ALKALINE PHOSPHATASE (ALP): 52 U/L (ref 35–115)
Aspartate Transaminase (AST): 18 U/L (ref 15–43)
BILIRUBIN TOTAL: 0.9 mg/dL (ref 0.3–1.3)
CALCIUM: 9.1 mg/dL (ref 8.6–10.5)
CARBON DIOXIDE TOTAL: 30 mmol/L (ref 24–32)
CHLORIDE: 102 mmol/L (ref 95–110)
CREATININE BLOOD: 1 mg/dL (ref 0.44–1.27)
E-GFR, AFRICAN AMERICAN: 87 mL/min/{1.73_m2}
E-GFR, NON-AFRICAN AMERICAN: 75 mL/min/{1.73_m2}
GLUCOSE: 90 mg/dL (ref 70–99)
POTASSIUM: 4.3 mmol/L (ref 3.3–5.0)
PROTEIN: 6.3 g/dL (ref 6.3–8.3)
SODIUM: 140 mmol/L (ref 135–145)
UREA NITROGEN, BLOOD (BUN): 14 mg/dL (ref 8–22)

## 2018-02-18 LAB — CBC WITH DIFFERENTIAL
Basophils % Auto: 0.9 %
Basophils Abs Auto: 0.1 10*3/uL (ref 0.0–0.2)
Eosinophils % Auto: 2.6 %
Eosinophils Abs Auto: 0.2 10*3/uL (ref 0.0–0.5)
Hematocrit: 44.1 % (ref 41.0–53.0)
Hemoglobin: 15 g/dL (ref 13.5–17.5)
Lymphocytes % Auto: 20.3 %
Lymphocytes Abs Auto: 1.5 10*3/uL (ref 1.0–4.8)
MCH: 32.1 pg (ref 27.0–33.0)
MCHC: 33.9 % (ref 32.0–36.0)
MCV: 94.5 fL (ref 80.0–100.0)
MPV: 7.8 fL (ref 6.8–10.0)
Monocytes % Auto: 9.9 %
Monocytes Abs Auto: 0.7 10*3/uL (ref 0.1–0.8)
Neutrophils % Auto: 66.3 %
Neutrophils Abs Auto: 4.9 10*3/uL (ref 1.8–7.7)
Platelet Count: 201 10*3/uL (ref 130–400)
RDW: 13.5 % (ref 0.0–14.7)
Red Blood Cell Count: 4.66 10*6/uL (ref 4.50–5.90)
White Blood Cell Count: 7.3 10*3/uL (ref 4.5–11.0)

## 2018-02-18 LAB — LIPID PANEL
CHOLESTEROL: 177 mg/dL (ref 0–200)
HDL CHOLESTEROL: 54 mg/dL (ref >=35)
LDL CHOLESTEROL CALCULATION: 108 mg/dL (ref <130)
NON-HDL CHOLESTEROL: 123 mg/dL (ref <=160)
TOTAL CHOLESTEROL:HDL RATIO: 3.3 (ref <4.0)
TRIGLYCERIDE: 75 mg/dL (ref 35–160)

## 2018-02-23 ENCOUNTER — Other Ambulatory Visit: Payer: Self-pay | Admitting: Dermatology

## 2018-02-23 DIAGNOSIS — L409 Psoriasis, unspecified: Principal | ICD-10-CM

## 2018-02-23 MED ORDER — ACITRETIN 25 MG CAPSULE
25.0000 mg | ORAL_CAPSULE | Freq: Every day | ORAL | 11 refills | Status: DC
Start: 2018-02-23 — End: 2018-04-02

## 2018-02-23 NOTE — Progress Notes (Signed)
Department of Internal Medicine  Division of Pulmonary, Sleep, and Critical Care Medicine  Patient Kenneth Gordon                             MRN [id number]       Date of Service: 04/01/2018     Dear Dr. Derrek Gu, Noreene Larsson,    We had the pleasure of seeing Kenneth Gordon in the [facility] today for follow up.  Please see below for our evaluation and recommendations for management.     ID: Kenneth Gordon is a 72yr old male patient who follows in pulmonary clinic for COPD    Interim Events: Since last pulmonary clinic visit on 10/14/17 by Dr. Karie Chimera:  - IgE and a1AT level  - No low dose Chest CT completed given insurance reasons   - Missed oximetry testing     Subjective:  - CAT 18 (Prior 19; 18) (>10 poorly controlled; change in 2 MCID)  - mmRC 2 (Prior 2; 3) (>2 poorly controlled)  - Max exertion: garage to clinic, cant climb stairs   - Perception of breathing is the same as prior.     Exacerbations:  - Never been hospitalized for COPD exacerbation   - Leaf blower makes it worse    Meds:   - Combivent use 4-5x/day , states he uses it as a "habit" . Albuterol nebs 2x/week   - Symbicort BID  - Spiriva Respimat daily     Still smokes 5 cigs/day (from 1 pack/day). Tried smoking cessation number.  Restarted Chantix.     COPD Assessment Test (CAT)   How often do you cough? Frequently Cough   Do you have phlegm (mucus) in your chest? Some Phlegm   Do you have chest tightness? More Tightness than Usual   Do you feel breathless walking uphill or up the stairs? Very Breathless   Are you limited doing any activities at home? Not Limited   Are you confident leaving home despite your lung condition? Mostly Confident   Are you able to sleep soundly despite your lung condition? Sometimes Wake Up   How is your energy level? Have Some Energy   Total Score: 18 (calculated)     Review of Systems:  Cough, dyspnea on exertion   Occasional GERD q3-4 mo. Better with carbonated water     All other systems reviewed during  the visit are negative, except as noted above.  The negative ROS include absence of other constitutional (fevers, chills, night sweats, unintentional weight loss), nasal ulcers, dry mouth, chest pain, dysphagia, musculoskeletal (joint pain, synovitis), skin including rashes and purpura, neurological including mononeuropathies (e. g. peripheral nerve pain affecting hands, arms, legs), and adverse reactions to prescribed drugs.    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 Active 1/2 pack per day    Medications/Allergies:  Medications and Allergies were reviewed with the  patient      COPD Meds    Albuterol (ACCUNEB) 1.25 mg/3 mL nebulizer solution, Take 3 mL by inhalation every 4 hours if needed., Disp: 30 vial, Rfl: 3    Albuterol/Ipratropium (COMBIVENT RESPIMAT) 20-100 mcg/actuation Inhaler, Take 1 puff by inhalation 4 times daily., Disp: 3 inhaler, Rfl: 3    Budesonide/Formoterol (SYMBICORT) 160-4.5 mcg/actuation Inhaler, Take 2 puffs by inhalation 2 times daily., Disp: 1 inhaler, Rfl: 11    Tiotropium (SPIRIVA RESPIMAT) 2.5 mcg/actuation Inhaler, Take 2 sprays by inhalation every day., Disp: 4 g, Rfl: 11    Other Outpatient Medications:     Aspirin 81 mg Chewable Tablet, Take 81 mg by mouth every day., Disp: , Rfl:     Atorvastatin (LIPITOR) 40 mg tablet, Take 1 tablet by mouth every day., Disp: 90 tablet, Rfl: 3    Azelastine Nasal (ASTELIN) 137 mcg (0.1 %) Spray, Instill 2 sprays into EACH nostril 2 times daily., Disp: 1  bottle, Rfl: 0    Fluticasone (FLONASE) 50 mcg/actuation nasal spray, Instill 1-2 sprays into EACH nostril every day. (allergies), Disp: 16 g, Rfl: 5    Lisinopril (PRINIVIL, ZESTRIL) 20 mg Tablet, TAKE 1 TABLET BY MOUTH EVERY DAY FOR BLOOD PRESSURE, Disp: 90 tablet, Rfl: 0    PredniSONE (DELTASONE) 20 mg Tablet, Take 2 tablets by mouth every day. take 2 tablets by mouth once daily for 5 days, Disp: 10 tablet, Rfl: 0    Varenicline (CHANTIX CONTINUING MONTH BOX) 1 mg Tablet, Take 1 tablet by mouth 2 times daily., Disp: 56 tablet, Rfl: 3    Varenicline (CHANTIX STARTING MONTH BOX) 0.5 mg (11)- 1 mg (42) tablet, Take 0.5 mg tablet daily for 3 days, then take 0.5 mg tablet twice daily for 4 days, then take 1 mg tablet twice daily., Disp: 53 tablet, Rfl: 3     PHYSICAL EXAM:  Vital Signs: BP 127/79 (SITE: left arm, Orthostatic Position: sitting, Cuff Size: regular)   Pulse 90   Temp 36.3 C (97.3 F) (Temporal)   Resp 20   Ht 1.88 m (6\' 2" )   Wt 88.1 kg (194 lb 3.6 oz)   SpO2 96%   BMI 24.94 kg/m    Constitutional: The patient is  well-developed. No acute distress.   Ear, Nose, Mouth & Throat:  Oropharynx is benign. No oral thrush.  No palpable lymphadenopathy. Sinuses non-tender. Mucus in nasal passages.   Respiratory:  Normal respiratory effort and work of breathing. No wheezing noted. Hyperresonant. Chest wall enlargement.   Cardiovascular:  Regular rate and regular rhythm. No murmur, rub, or gallop.   Skin: Psoriasis plaques on elbows  Musculoskeletal: No peripheral edema.  Neuro: No focal neurological findings.    LAB TESTS  Labs notable for:  Hematology:  Lab Results   Component Value Date    WBC 7.3 02/18/2018    HGB 15.0 02/18/2018    AUTOEOS 2.6 02/18/2018    AUTOEOSABS 0.2 02/18/2018    IGE 75.8 (H) 10/16/2017     Chemistry:  Lab Results   Lab Name Value Date/Time    CO2 30 02/18/2018 09:46 AM    CO2 30 12/30/2014 07:14 AM    BUN 14 02/18/2018 09:46 AM    BUN 17 12/30/2014 07:14 AM    CR 1.00  02/18/2018 09:46 AM    CR 0.97 12/30/2014 07:14 AM     Coagulation:  Lab Results   Component Value Date    PLT 201 02/18/2018    INR 1.75 (H)  10/16/2017    APTT 26.5 11/01/2016     a1AT - 186 mg/dL  Phenotype [id number]  CRP 0.5  Fibrinogen Degradation Product <10   RAST Panel- negative   FENO- 20     IMAGING/STUDIES    CXR 04/02/17   - Pulmonary hyperinflation c/w COPD  - No nodules by CXR    TTE 08/04/17  1. The LV systolic function is normal. The estimated LV ejection fraction is 60 %.  2. Trivial pericardial effusion.  3. Decreased left ventricular internal cavity size.  4. Mild aortic valve sclerosis without stenosis.  5. Mild concentric left ventricular hypertrophy.  6. The left ventricular diastolic function is abnormal. Stage I: Impaired early left ventricular relaxation (Abnormal relaxation pattern).  7. The left atrium is normal in size and structure.  8. The right atrium is normal in size and structure.    PFT 10/06/17    Very severe COPD is confirmed by reduced expiratory flow rates, volumes, and the flow volume loop. Severe lung hyperinflation. DLco is severely reduced probably because of pulmonary emphysema but anemia, ILD, pulmonary vascular and drug-induced lung disease should remain a concern.     ASSESSMENT AND PLAN  Molly MaduroRobert is a [age]yr old male here for:    # COPD  Severe Obstruction.   GOLD Class: D   Phenotype: Emphysematous  Therapy:  - Bronchodilator therapy:    Rescue- Combivent MDI q4h PRN, albuterol nebs    Controller- Symbicort (ICS/LABA), Spiriva (LAMA)   Exacerbation prevention- Patient not a candidate for chronic azithromycin or Roflumilast at this time as no frequent exacerbations    In patients with severe air trapping or diminished PIF, nebulized bronchodilators and inhaled corticosteroids can offer superior drug delivery. Patient wanting to defer at this time  - Pulmonary Rehab offered to patient and he accepted (current smoker, so may not be accepted by program)  - Patient not a candidate for  lung reduction surgery given ongoing smoking   - Recommend early palliative care involvement     Evaluation:  - Full PFTs with BD response yearly  - Oxygen qualifying test recommended again to patinet  - Annual low dose Chest CT for lung cancer screening recommended and discussed with patient    # Tobacco  Dependence   - I counseled the patient on smoking cessation and provided [place] resources to assist the patient on this goal  - Nicotine lozenges, patches, gum tried without success  - Continue Varencicline (Chantix)     # Nasal Allergies- currently controlled  - Flonase, Astelin sprays as needed     # GERD  - Famotidine 40mg  daily and PRN    # Health Maintenance  - Recommend Influenza, Prevnar, Pneumovax, Tdap vaccinations per CDC guidelines    EDUCATION:  I educated/instructed the patient or caregiver regarding all aspects of the above stated plan of care.  The patient or caregiver indicated understanding.     We will plan to see Renard HamperRobert G Cardell in clinic in 6 months for follow-up.  Please do not hesitate to contact us with any additional questions or concerns.    Patient was seen and discussed with Dr. Charlette CaffeyMorrissey     Sincerely,  Electronically signed by:  Vicente MassonAndrew Odus Clasby, MD  Fellow, Division of Pulmonary and Critical Care Medicine  Department of Internal Medicine  Pager [phone]  [facility] # [phone]      [address], [address]  [place], [id number]  Phone: [phone]  Fax: [phone]

## 2018-02-26 ENCOUNTER — Ambulatory Visit: Payer: Medicare Other | Admitting: Dermatology

## 2018-02-26 ENCOUNTER — Ambulatory Visit: Payer: Medicare Other | Attending: Dermatology

## 2018-02-26 DIAGNOSIS — L409 Psoriasis, unspecified: Principal | ICD-10-CM | POA: Insufficient documentation

## 2018-02-26 NOTE — Nursing Note (Signed)
Referring Doctor:   Lindwood Quarehal  Diagnosis:  psoriasis  Phototherapy Lightbox narrowband       Patient arrived for orientation to phototherapy.  The narrowband light box and dressing rooms were shown to patient. Patient was shown where the gowns and towels are kept and how to use them. The dirty linen basket was shown as well.     Patient was shown how to enter and exist the light box, the location of the call light, how to center his/herself in the light box  and how to exit the box in emergency.     Patient supply box was shown to patient and will be given to the patient upon arrival for first treatment. He/she will recieve goggles, sunblock for use on nipples, chap stick for lips and non-skid slipper socks..     The uvb information sheets were give to and reviewed with patient/patient's parent.  All questions and concerns were answered. uvb information form signed by patient/patient's parent.    [name] will start phototherapy 03/02/18.    D.L. Kenneth Hippler Sr. LVN

## 2018-03-02 ENCOUNTER — Other Ambulatory Visit: Payer: Self-pay | Admitting: Family Medicine

## 2018-03-02 DIAGNOSIS — L409 Psoriasis, unspecified: Principal | ICD-10-CM

## 2018-03-03 NOTE — Telephone Encounter (Signed)
Last RF 01/07/18  Last OV 01/16/18  Sherrie MustacheSharon Willis Gordon, RMA

## 2018-03-04 ENCOUNTER — Ambulatory Visit: Payer: Medicare Other | Attending: Dermatology

## 2018-03-04 DIAGNOSIS — L409 Psoriasis, unspecified: Principal | ICD-10-CM | POA: Insufficient documentation

## 2018-03-04 NOTE — Nursing Note (Signed)
Referral to Phototherapy by: Doctor:  rehal  Changes in medication: none  Changes in topical/lotions: none  Patient tolerated treatment well.    See Doc Flowsheet for phototherapy visit.  D.L. Rosella Crandell Sr. LVN

## 2018-03-06 ENCOUNTER — Ambulatory Visit: Payer: Medicare Other | Attending: Dermatology

## 2018-03-06 DIAGNOSIS — L408 Other psoriasis: Principal | ICD-10-CM | POA: Insufficient documentation

## 2018-03-06 NOTE — Nursing Note (Signed)
Referral to Phototherapy by: Doctor: Dr.Rehal  Reaction to last TX : none  Patient tolerated treatment well.   Changes to medication: none   See Doc Flowsheet for phototherapy visit.      Sony Schlarb, Sr.LVN

## 2018-03-09 ENCOUNTER — Ambulatory Visit: Payer: Medicare Other | Attending: Dermatology

## 2018-03-09 DIAGNOSIS — L409 Psoriasis, unspecified: Principal | ICD-10-CM | POA: Insufficient documentation

## 2018-03-09 NOTE — Nursing Note (Signed)
Referral to Phototherapy by: Doctor: Rehal  Reaction to last TX : none  Patient tolerated treatment well.   Changes to medication: none   See Doc Flowsheet for phototherapy visit.  Kenneth GordonLVN

## 2018-03-11 ENCOUNTER — Ambulatory Visit: Payer: Medicare Other | Attending: Dermatology

## 2018-03-11 DIAGNOSIS — L409 Psoriasis, unspecified: Principal | ICD-10-CM | POA: Insufficient documentation

## 2018-03-11 NOTE — Nursing Note (Signed)
Referral to Phototherapy by: Doctor: Rehal  Reaction to last TX : none  Patient tolerated treatment well.   Changes to medication: none   See Doc Flowsheet for phototherapy visit.  Jadelin Eng Aldridge-Grant sr.LVN

## 2018-03-13 ENCOUNTER — Ambulatory Visit: Payer: Medicare Other | Attending: Dermatology

## 2018-03-13 DIAGNOSIS — L409 Psoriasis, unspecified: Principal | ICD-10-CM | POA: Insufficient documentation

## 2018-03-13 NOTE — Nursing Note (Signed)
Referral to Phototherapy by: Doctor: Rehal  Reaction to last TX : none  Patient tolerated treatment well.   Changes to medication: none   See Doc Flowsheet for phototherapy visit.  Kayden Hutmacher Aldridge-Grant sr.LVN

## 2018-03-16 ENCOUNTER — Ambulatory Visit: Payer: Medicare Other | Attending: Dermatology

## 2018-03-16 DIAGNOSIS — L409 Psoriasis, unspecified: Principal | ICD-10-CM | POA: Insufficient documentation

## 2018-03-16 NOTE — Nursing Note (Signed)
Referral to Phototherapy by: Doctor:  rehal  Changes in medication: none  Changes in topical/lotions: none  Patient tolerated treatment well.    See Doc Flowsheet for phototherapy visit.  D.L. Karysa Heft Sr. LVN

## 2018-03-20 ENCOUNTER — Ambulatory Visit: Payer: Self-pay

## 2018-03-23 ENCOUNTER — Ambulatory Visit: Payer: Medicare Other | Attending: Dermatology

## 2018-03-23 DIAGNOSIS — L409 Psoriasis, unspecified: Principal | ICD-10-CM | POA: Insufficient documentation

## 2018-03-23 NOTE — Nursing Note (Signed)
Referral to Phototherapy by: Doctor: Rehal  Reaction to last TX : none  Patient tolerated treatment well.   Changes to medication: none   See Doc Flowsheet for phototherapy visit.  Jennifer Aldridge-Grant sr.LVN

## 2018-03-27 ENCOUNTER — Ambulatory Visit: Payer: Medicare Other | Attending: Dermatology

## 2018-03-27 DIAGNOSIS — L409 Psoriasis, unspecified: Principal | ICD-10-CM | POA: Insufficient documentation

## 2018-03-27 NOTE — Nursing Note (Signed)
Referral to Phototherapy by: Doctor:  rehal  Changes in medication: none  Changes in topical/lotions: none  Patient tolerated treatment well.    See Doc Flowsheet for phototherapy visit.  D.L. Willette Mudry Sr. LVN

## 2018-03-30 ENCOUNTER — Ambulatory Visit: Payer: Medicare Other | Attending: Dermatology

## 2018-03-30 DIAGNOSIS — L409 Psoriasis, unspecified: Principal | ICD-10-CM | POA: Insufficient documentation

## 2018-03-30 NOTE — Nursing Note (Signed)
Referral to Phototherapy by: Doctor:  rehal  Changes in medication: none  Changes in topical/lotions: none  Patient tolerated treatment well.    See Doc Flowsheet for phototherapy visit.  D.L. Sheina Mcleish Sr. LVN

## 2018-04-01 ENCOUNTER — Ambulatory Visit: Payer: Medicare Other

## 2018-04-01 ENCOUNTER — Ambulatory Visit: Payer: Medicare Other | Attending: Dermatology | Admitting: PULMONARY DISEASE

## 2018-04-01 ENCOUNTER — Encounter: Payer: Self-pay | Admitting: PULMONARY DISEASE

## 2018-04-01 VITALS — BP 127/79 | HR 90 | Temp 97.3°F | Resp 20 | Ht 74.0 in | Wt 194.2 lb

## 2018-04-01 DIAGNOSIS — J309 Allergic rhinitis, unspecified: Secondary | ICD-10-CM

## 2018-04-01 DIAGNOSIS — F17200 Nicotine dependence, unspecified, uncomplicated: Secondary | ICD-10-CM

## 2018-04-01 DIAGNOSIS — J439 Emphysema, unspecified: Secondary | ICD-10-CM

## 2018-04-01 DIAGNOSIS — Z716 Tobacco abuse counseling: Secondary | ICD-10-CM

## 2018-04-01 DIAGNOSIS — J432 Centrilobular emphysema: Secondary | ICD-10-CM | POA: Insufficient documentation

## 2018-04-01 DIAGNOSIS — K219 Gastro-esophageal reflux disease without esophagitis: Secondary | ICD-10-CM

## 2018-04-01 DIAGNOSIS — L409 Psoriasis, unspecified: Principal | ICD-10-CM | POA: Insufficient documentation

## 2018-04-01 MED ORDER — FAMOTIDINE 40 MG TABLET
40.0000 mg | ORAL_TABLET | Freq: Every day | ORAL | 11 refills | Status: DC
Start: 2018-04-01 — End: 2019-04-20

## 2018-04-01 NOTE — Progress Notes (Signed)
ATTENDING NOTE: I have seen and examined the patient. I agree with the findings and recommendations we jointly made as documented in the fellow's notes. I have personally reviewed the radiographs. In brief,  Kenneth Gordon 32 returns for f/u regarding COPD. He has coincident cardiac disease and tobacco use. Exam: alert, engaged, intermittent coughing. Lungs: prolonged exp phase Ext: mild edema.  COPD --recommended and reviewed benefits of cessation of smoking. Reviewed contradiction of inhalers with ongoing smoking.  Reveiwed and recommend supplmentalt oxygen and rehab--both call for smoking cessation.    Electronically signed by: Elna Breslow, MD,  Attending Physician

## 2018-04-01 NOTE — Patient Instructions (Addendum)
-   Perform your walk oxygen testing (call them to schedule)  - Low dose screening chest CT to eval for lung cancer (call them to schedule)  - Continue Chantix and your smoking cessation efforts!  - Famotidine for heart burn as needed    Your opinion matters!  Please help [place] provide feedback for our fellow physicians with a short evaluation.    Evaluation Link: [email or web address] (be sure to turn off pop-up blocker)             Please select: Program/School: Pulmonary Disease & Critical Care Medicine           Please click on Evaluation of an Intern, Resident, or Fellow           Please select your fellow physician from the drop-down menu; once a fellow is selected; the evaluation will appear           Please complete the anonymous evaluation and click on the green submit button.            Evaluation is now complete! Thank you very much!     Radiology scheduling [phone]    Pulmonary Service Lab  [facility], Room 5206  [address] [place], [place] [address]  [phone]

## 2018-04-01 NOTE — Nursing Note (Signed)
Patient was verified with 2 identifiers. Chief complaint/reason for visit noted, allergies verified, vital signs taken, screened for pain, and discussed history of falls and assisted devices. Tobacco use was reviewed and preferred pharmacy verified. Medication reconciliation initiated with printed list of current medications to be reviewed; edited by the patient and reconciled with provider. Kenneth Gordon comes in ambulatory.  Temp: 36.3 C (97.3 F) (01/08 1303)  Temp src: Temporal (01/08 1303)  Pulse: 90 (01/08 1303)  BP: 127/79 (01/08 1303)  Resp: 20 (01/08 1303)  SpO2: 96 % (01/08 1303)  Height: 188 cm (6\' 2" ) (01/08 1303)  Weight: 88.1 kg (194 lb 3.6 oz) (01/08 1303)   Patient is in ROOM # 02  - Heron Sabins, MA II (Float)

## 2018-04-01 NOTE — Nursing Note (Signed)
Referral to Phototherapy by: Doctor:  rehal  Changes in medication: none  Changes in topical/lotions: none  Patient tolerated treatment well.    See Doc Flowsheet for phototherapy visit.  D.L. Angeli Demilio Sr. LVN

## 2018-04-02 ENCOUNTER — Encounter: Payer: Self-pay | Admitting: Dermatology

## 2018-04-02 ENCOUNTER — Telehealth: Payer: Self-pay

## 2018-04-02 DIAGNOSIS — L409 Psoriasis, unspecified: Principal | ICD-10-CM

## 2018-04-02 MED ORDER — ACITRETIN 25 MG CAPSULE
25.0000 mg | ORAL_CAPSULE | Freq: Every day | ORAL | 11 refills | Status: AC
Start: 2018-04-02 — End: 2019-03-28

## 2018-04-02 NOTE — Telephone Encounter (Signed)
Patient last office visit on 02/04/28

## 2018-04-02 NOTE — Telephone Encounter (Signed)
Medical Director Review for Pulmonary Rehab Evaluation  I have briefly reviewed the patient's medical record and note by [facility]. He has significant SOB and pulmonary impairment that warrant evaluation by the PRP team.  Dr Kristene Liberati MD

## 2018-04-02 NOTE — Telephone Encounter (Signed)
04/02/18 3:24 PM    [facility] ([facility])  Intake Interview Questionnaire     ID patient: Using 3 person identifiers [facility]) confirm the 3 listed below:     Full name: Kenneth Gordon   DOB: [birth year]    Address: [address] [place]     Do you know what pulmonary rehabilitation is?      Explained to patient     Have you been through a pulmonary rehabilitation program before?    No  Are you interested attending pulmonary rehab?     Yes    Are you able to commit to 7 weeks?     Tuesday/Thursday   10:30-1:00  Do you have transportation?     Self    Is English your primary language?     Yes    What are your pay options?     Secondary Insurance Medicare Supplement    Medicare    Are you currently smoking?     If yes, are you actively enrolled in a smoking cessation program? Enrolled and down to 3 cigarettes per day. Taking Chantex. Patient says he will have quit by May     What is your current activity level?     Ambulatory walks 1 1/2 miles per day      Do you use any assistive devices?     No     Do you exercise regularly and if yes, what type?     Walks few days per week with moderate SOB. Paces himself     Do you cough? yes  Is your cough productive? Yes, white clear.   Do you have difficulty clearing secretions? Dry cough in AM. Hot coffee helps to lossen secretions.        Supplemental oxygen requirements:     No    Number of respiratory exacerbations in the past 12 months?      Number of ER visits:  none   Number of Admissions:  none   Number of times on antibiotics yes, twice   Number of times on prednisone yes, twice    Other Hospitalizations:     1 times in the past 12 months   Reason for hospitalization(s): clotting in legs. Not on blood thinner anymore. Does not check INR. Takes aspirin daily.     Living Situation:       Assisted living. Will be moving back to [place] when his health gets better     Do you have pain?      Pain Rating:   no    Pulmonary Diagnosis:     COPD    Additional health information:       Cardiac hx: none   Diabetes: none   Recent falls: none   Other: Psoriasis, PVD    Additional notes:   Referred by: Matthys    Exercise Eval appt: 07/27/2018 0930  MD Eval appt: 08/10/2018 0945  Class: Monday/Wednesday  Tuesday/Thursday  10:30-1:00  Program dates:   08/19/2018 -10/07/2018    Intake routed to PR medical director, Dr. Waldon Reining, for approval to evaluate patient for the program.

## 2018-04-03 ENCOUNTER — Ambulatory Visit: Payer: Self-pay

## 2018-04-06 ENCOUNTER — Ambulatory Visit: Payer: Medicare Other | Attending: Dermatology

## 2018-04-06 DIAGNOSIS — L408 Other psoriasis: Principal | ICD-10-CM | POA: Insufficient documentation

## 2018-04-06 NOTE — Nursing Note (Signed)
Referral to Phototherapy by: Doctor:rehal  Reaction to last TX :no  Patient tolerated treatment well.   Changes to medication:no   See Doc Flowsheet for phototherapy visit.  Haley Roza, LVN  Dermatology Department

## 2018-04-07 ENCOUNTER — Encounter: Payer: Self-pay | Admitting: Family Medicine

## 2018-04-08 ENCOUNTER — Ambulatory Visit: Payer: Self-pay

## 2018-04-10 ENCOUNTER — Ambulatory Visit: Payer: Medicare Other | Attending: Dermatology

## 2018-04-10 DIAGNOSIS — L408 Other psoriasis: Principal | ICD-10-CM | POA: Insufficient documentation

## 2018-04-10 NOTE — Nursing Note (Signed)
Referral to Phototherapy by: Doctor: Rehal  Reaction to last TX : none  Patient tolerated treatment well.   Changes to medication: none   See Doc Flowsheet for phototherapy visit.  Tykeisha Peer Aldridge-Grant sr.LVN

## 2018-04-15 ENCOUNTER — Ambulatory Visit: Payer: Medicare Other | Attending: Dermatology

## 2018-04-15 DIAGNOSIS — L408 Other psoriasis: Principal | ICD-10-CM | POA: Insufficient documentation

## 2018-04-15 NOTE — Nursing Note (Signed)
Referral to Phototherapy by: Doctor:  rehal  Changes in medication: none  Changes in topical/lotions: none  Patient stated he has not experienced any adverse reaction from his treatment.  Patient tolerated treatment well.    See Doc Flowsheet for phototherapy visit.  D.L. Miriya Cloer Sr. LVN

## 2018-04-17 ENCOUNTER — Ambulatory Visit: Payer: Medicare Other | Admitting: Family Medicine

## 2018-04-17 ENCOUNTER — Ambulatory Visit: Payer: Medicare Other | Attending: Dermatology

## 2018-04-17 DIAGNOSIS — L408 Other psoriasis: Principal | ICD-10-CM | POA: Insufficient documentation

## 2018-04-17 NOTE — Progress Notes (Deleted)
Chief Complaint: Follow up COPD    Kenneth Gordon is a 73yr old malewith history of COPD, asthmaand tobacco dependencewho presents for59month follow up.     Reports he will still get get occasionalincreased work of breathing intermittently throughout the dayusually in the morning when he wakes.Hecanwake in the morning and feel "a weight" in his chest that wouldgradually improve but not completely go away. He notes associated nasal congestion, runny nose and PND and throat clearing. The above symptoms have improved after pulmonary consult and medication optimization with respimat style inhalers.    Saw Pulmonology7/23/19 whohave him on Symbicort, Spiriva respimat and Combivent respimatfor better ease of use. No using flonase currently.    Denies fever, chills, congestion.No change to color or amount of morning sputum. No hemoptysis.    Today he reports worsening of his psoriasis. Typically uses Clobetasol but this is not working like it used to and now his plaques have expanded beyond his elbows to his knees and back.        REVIEW OF SYSTEMS:   Prob pert (0-1 systems reviewed); Extended (2-9 systems reviewed); Complete (10+ systems reviewed) :300020    I did review patient's past medical and family/social history, no changes noted.    CURRENT MEDICATIONS:  Current Outpatient Medications   Medication Sig Dispense Refill    Acitretin (SORIATANE) 25 mg Capsule Take 1 capsule by mouth every morning with a meal. 30 capsule 11    Albuterol (ACCUNEB) 1.25 mg/3 mL nebulizer solution Take 3 mL by inhalation every 4 hours if needed. 30 vial 3    Albuterol (ACCUNEB) 1.25 mg/3 mL nebulizer solution INHALE THE CONTENTS OF 1 VIAL(3 ML) VIA NEBULIZER BY MOUTH EVERY 4 HOURS AS NEEDED 75 mL 0    Albuterol/Ipratropium (COMBIVENT RESPIMAT) 20-100 mcg/actuation Inhaler Take 1 puff by inhalation every 4 hours if needed. 4 g 0    Albuterol/Ipratropium (COMBIVENT RESPIMAT) 20-100 mcg/actuation Inhaler Take 1  puff by inhalation 4 times daily. 3 inhaler 3    Aspirin 81 mg Chewable Tablet Take 81 mg by mouth every day.      Atorvastatin (LIPITOR) 40 mg tablet Take 1 tablet by mouth every day. 90 tablet 3    Azelastine Nasal (ASTELIN) 137 mcg (0.1 %) Spray Instill 2 sprays into EACH nostril 2 times daily. 1 bottle 0    Budesonide/Formoterol (SYMBICORT) 160-4.5 mcg/actuation Inhaler Take 2 puffs by inhalation 2 times daily. 1 inhaler 11    Clobetasol (TEMOVATE) 0.05 % Ointment APPLY SPARINGLY EXTERNALLY TO THE AFFECTED AREA TWICE DAILY 60 g 0    FamoTIDine (PEPCID) 40 mg Tablet Take 1 tablet by mouth every day. 90 tablet 11    Fluticasone (FLONASE) 50 mcg/actuation nasal spray Instill 1-2 sprays into EACH nostril every day. (allergies) 16 g 5    Lisinopril (PRINIVIL, ZESTRIL) 20 mg Tablet TAKE 1 TABLET BY MOUTH EVERY DAY FOR BLOOD PRESSURE 90 tablet 0    PredniSONE (DELTASONE) 20 mg Tablet Take 2 tablets by mouth every day. take 2 tablets by mouth once daily for 5 days 10 tablet 0    Tiotropium (SPIRIVA RESPIMAT) 2.5 mcg/actuation Inhaler Take 2 sprays by inhalation every day. 4 g 11    Varenicline (CHANTIX CONTINUING MONTH BOX) 1 mg Tablet Take 1 tablet by mouth 2 times daily. 56 tablet 3    Varenicline (CHANTIX STARTING MONTH BOX) 0.5 mg (11)- 1 mg (42) tablet Take 0.5 mg tablet daily for 3 days, then take 0.5 mg tablet twice daily for  4 days, then take 1 mg tablet twice daily. 53 tablet 3     No current facility-administered medications for this visit.        PHYSICAL EXAM:  There were no vitals taken for this visit.   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 Patient verbalizes understanding of teaching and instructions.

## 2018-04-17 NOTE — Nursing Note (Signed)
Referral to Phototherapy by: Doctor:  rehal  Changes in medication: none  Changes in topical/lotions: none  Patient tolerated treatment well.    See Doc Flowsheet for phototherapy visit.  D.L. Cristofer Yaffe Sr. LVN

## 2018-04-20 ENCOUNTER — Ambulatory Visit: Payer: Medicare Other | Attending: Dermatology

## 2018-04-20 ENCOUNTER — Ambulatory Visit: Payer: Medicare Other | Admitting: Vascular Surgery

## 2018-04-20 ENCOUNTER — Ambulatory Visit: Payer: Medicare Other

## 2018-04-20 DIAGNOSIS — L408 Other psoriasis: Principal | ICD-10-CM | POA: Insufficient documentation

## 2018-04-20 NOTE — Nursing Note (Signed)
Referral to Phototherapy by: Doctor: Rehal  Reaction to last TX : none  Patient tolerated treatment well.   Changes to medication: none   See Doc Flowsheet for phototherapy visit.  Georgene Kopper Aldridge-Grant sr.LVN

## 2018-04-22 ENCOUNTER — Ambulatory Visit: Payer: Self-pay

## 2018-04-22 ENCOUNTER — Telehealth: Payer: Self-pay

## 2018-04-22 ENCOUNTER — Other Ambulatory Visit: Payer: Self-pay | Admitting: Family Medicine

## 2018-04-22 DIAGNOSIS — F17200 Nicotine dependence, unspecified, uncomplicated: Principal | ICD-10-CM

## 2018-04-22 NOTE — Telephone Encounter (Signed)
Pulmonary Rehabilitation Telephone Encounter     01/19/2018  12:38 PM    Patient called with the following results: Three patient identifiers confirmed. Patient's PR eval appointment has been rescheduled from 07/27/2018 0930 to 08/03/2018 0930. Patient is ok with this change.     Dina Rich, RT

## 2018-04-23 NOTE — Telephone Encounter (Signed)
Last rf 11/05/17, last ov 01/16/18.

## 2018-04-24 ENCOUNTER — Ambulatory Visit: Payer: Medicare Other | Attending: Dermatology

## 2018-04-24 DIAGNOSIS — L408 Other psoriasis: Principal | ICD-10-CM | POA: Insufficient documentation

## 2018-04-24 NOTE — Nursing Note (Signed)
Referral to Phototherapy by: Doctor: Rehal  Reaction to last TX : none  Patient tolerated treatment well.   Changes to medication: none   See Doc Flowsheet for phototherapy visit.  Kenneth Gordon sr.LVN

## 2018-04-27 ENCOUNTER — Ambulatory Visit
Admission: RE | Admit: 2018-04-27 | Discharge: 2018-04-27 | Disposition: A | Discharge status: Home / Self Care | Payer: Medicare Other | Source: Ambulatory Visit | Attending: Vascular Surgery | Admitting: Vascular Surgery

## 2018-04-27 ENCOUNTER — Ambulatory Visit: Payer: Medicare Other | Attending: Vascular Surgery | Admitting: Vascular Surgery

## 2018-04-27 VITALS — BP 157/76 | HR 88 | Temp 97.8°F | Resp 19 | Wt 177.2 lb

## 2018-04-27 DIAGNOSIS — I739 Peripheral vascular disease, unspecified: Secondary | ICD-10-CM

## 2018-04-27 DIAGNOSIS — Z95828 Presence of other vascular implants and grafts: Secondary | ICD-10-CM | POA: Insufficient documentation

## 2018-04-27 DIAGNOSIS — I724 Aneurysm of artery of lower extremity: Secondary | ICD-10-CM | POA: Insufficient documentation

## 2018-04-27 DIAGNOSIS — Z87891 Personal history of nicotine dependence: Secondary | ICD-10-CM | POA: Insufficient documentation

## 2018-04-27 DIAGNOSIS — T82856D Stenosis of peripheral vascular stent, subsequent encounter: Secondary | ICD-10-CM | POA: Insufficient documentation

## 2018-04-27 NOTE — Progress Notes (Signed)
04/27/2018      Lorrin Jackson, DO  [address]  [facility]  [place], [place] [address]    Dear Derrek Gu, Noreene Larsson, DO,    I had the distinct pleasure of seeing Kenneth Gordon in the [facility] for follow up of peripheral vascular disease.    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  He was last seen in clinic on October 2019 at which time he was asymptomatic at that visit despite Duplex imaging which demonstrated total occlusion of his popliteal stent. He remains asymptomatic with no significant claudication.  Ambulation is limited by COPD.    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    Current Outpatient Medications on File Prior to Visit   Medication Sig Dispense Refill    Albuterol (ACCUNEB) 1.25 mg/3 mL nebulizer solution Take 3 mL by inhalation every 4 hours if needed. 30 vial 3    Albuterol (ACCUNEB) 1.25 mg/3 mL nebulizer solution INHALE THE CONTENTS OF 1 VIAL(3 ML) VIA NEBULIZER BY MOUTH EVERY 4 HOURS AS NEEDED 75 mL 0    Albuterol/Ipratropium (COMBIVENT RESPIMAT) 20-100 mcg/actuation Inhaler Take 1 puff by inhalation every 4 hours if needed. 4 g 0    Albuterol/Ipratropium (COMBIVENT RESPIMAT) 20-100 mcg/actuation Inhaler Take 1 puff by inhalation 4 times daily. 3 inhaler 3    Aspirin 81 mg Chewable Tablet Take 81 mg by mouth every day.       Atorvastatin (LIPITOR) 40 mg tablet Take 1 tablet by mouth every day. 90 tablet 3    Azelastine Nasal (ASTELIN) 137 mcg (0.1 %) Spray Instill 2 sprays into EACH nostril 2 times daily. 1 bottle 0    Budesonide/Formoterol (SYMBICORT) 160-4.5 mcg/actuation Inhaler Take 2 puffs by inhalation 2 times daily. 1 inhaler 11    Fluticasone (FLONASE) 50 mcg/actuation nasal spray Instill 1-2 sprays into EACH nostril every day. (allergies) 16 g 5    Lisinopril (PRINIVIL, ZESTRIL) 20 mg Tablet TAKE 1 TABLET BY MOUTH EVERY DAY FOR BLOOD PRESSURE 90 tablet 0    PredniSONE (DELTASONE) 20 mg Tablet Take 2 tablets by mouth every day. take 2 tablets by mouth once daily for 5 days 10 tablet 0    Tiotropium (SPIRIVA RESPIMAT) 2.5 mcg/actuation Inhaler Take 2 sprays by inhalation every day. 4 g 11    Varenicline (CHANTIX CONTINUING MONTH BOX) 1 mg Tablet Take 1 tablet by mouth 2 times daily. 56 tablet 3    Varenicline (CHANTIX STARTING MONTH BOX) 0.5 mg (11)- 1 mg (42) tablet Take 0.5 mg tablet daily for 3 days, then take 0.5 mg tablet twice daily for 4 days, then take 1 mg tablet twice daily. 53 tablet 3     No current facility-administered medications on file prior to visit.        No Known Allergies    Review of Systems    Physical Exam:  Temp: 36.6 C (97.9 F) (10/14 1311)  Temp src: Temporal (10/14 1311)  Pulse: 88 (10/14 1311)  BP: 116/68 (10/14 1312)  Resp: 22 (10/14 1311)  SpO2: --  Height: --  Weight: 78.9 kg (173 lb 15.1 oz) (10/14 1311)    Physical Exam   Constitutional: He is oriented to person, place, and time. He appears well-developed and well-nourished.   HENT:   Head: Normocephalic and atraumatic.   Eyes: Pupils are equal, round, and reactive to light. EOM are normal.   Neck: Normal range of motion. No tracheal deviation present.   Cardiovascular: Normal rate and regular rhythm.   Pulmonary/Chest: Effort normal and breath sounds normal.   Abdominal: Soft. He exhibits no distension.   Musculoskeletal: Normal  range of motion. He exhibits no edema or deformity.   Neurological: He is alert and oriented to person, place, and time.   Skin: Skin is warm and dry. Rash noted.   Psoriatic skin lesions affecting bother lower and upper extremities.    Psychiatric: He has a normal mood and affect. His behavior is normal.   Extremities:   - RLE: 2+ DP/PT, femoral arteries.   - LLE: Doppler signals DP/PT, 2+ femoral arteries.       Imaging was reviewed:     IMPRESSION:  1. NORMAL ARTERIAL PERFUSION OF THE RIGHT LOWER EXTREMITY.    2. MODERATE ARTERIAL INSUFFICIENCY OF THE LEFT LOWER EXTREMITY WITH ABI OF 0.52, DECREASED FROM 0.59 ON PREVIOUS EXAM.    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   He denies claudication, rest pain, or non-healing wounds. ABIs today are UNCHANGED from his last visit.     Plan:  - Follow up with scheduled ABI's and arterial Duplex in one year- Continue ASA 81 and Atorvastatin 40.    Donzetta Sprung, M.D.  Professor and Stage manager, Division of Vascular Surgery  [facility]  [address]  [place], [place]  [address]  [phone]

## 2018-04-27 NOTE — Nursing Note (Signed)
Vital signs taken, allergies verified, screened for pain,[name], MA II

## 2018-04-28 ENCOUNTER — Telehealth: Payer: Self-pay | Admitting: Family Medicine

## 2018-04-28 DIAGNOSIS — Z79899 Other long term (current) drug therapy: Secondary | ICD-10-CM | POA: Insufficient documentation

## 2018-04-28 NOTE — Telephone Encounter (Signed)
Per insurance, PA is not required for this medication. Pharmacy confirmed paid claim with $27 copay. Thank you.

## 2018-04-28 NOTE — Telephone Encounter (Signed)
chantix 1mg  not covered by plan. Can we initiate prior auth or see what else is covered.

## 2018-04-29 ENCOUNTER — Other Ambulatory Visit: Payer: Self-pay | Admitting: Family Medicine

## 2018-04-29 DIAGNOSIS — J410 Simple chronic bronchitis: Principal | ICD-10-CM

## 2018-05-04 ENCOUNTER — Ambulatory Visit: Payer: Medicare Other | Admitting: Family Medicine

## 2018-05-04 VITALS — BP 110/70 | HR 80 | Temp 97.3°F | Resp 20 | Wt 176.4 lb

## 2018-05-04 DIAGNOSIS — I70202 Unspecified atherosclerosis of native arteries of extremities, left leg: Secondary | ICD-10-CM

## 2018-05-04 DIAGNOSIS — J410 Simple chronic bronchitis: Principal | ICD-10-CM

## 2018-05-04 DIAGNOSIS — F17200 Nicotine dependence, unspecified, uncomplicated: Secondary | ICD-10-CM

## 2018-05-04 DIAGNOSIS — L409 Psoriasis, unspecified: Secondary | ICD-10-CM

## 2018-05-04 NOTE — Progress Notes (Signed)
Chief Complaint: Follow up COPD    Kenneth Gordon is a 73yr old malewith history of COPD, asthmaand tobacco dependencewho presents for49month follow up.     Reports he will still get get occasionalincreased work of breathing intermittently throughout the dayusually in the morning when he wakes.Hecanwake in the morning and feel "a weight" in his chest that wouldgradually improve but not completely go away. He notes associated nasal congestion, runny nose and PND and throat clearing. The above symptoms have improved after pulmonary consult and medication optimization with respimat style inhalers. He also is benefiting from [place] use most days. No nasal rinses at this time.    Saw Pulmonology most recently1/8/20 whohave him on Symbicort, Spiriva respimat and Combivent respimatfor better ease of use.    Denies fever, chills, congestion.No change to color or amount of morning sputum. No hemoptysis.    Today he reports improvement of his psoriasis. Has seen Derm who have been treating with phototherapy and Soriatane.    REVIEW OF SYSTEMS:  Constitutional: negative.  Ears, Nose, Mouth, Throat: post nasal drip.  CV: negative.  Resp: negative.  GI: negative.  Integumentary: rash.  Neuro: negative.    I did review patient's past medical and family/social history, no changes noted.    CURRENT MEDICATIONS:  Current Outpatient Medications   Medication Sig Dispense Refill    Acitretin (SORIATANE) 25 mg Capsule Take 1 capsule by mouth every morning with a meal. 30 capsule 11    Albuterol (ACCUNEB) 1.25 mg/3 mL nebulizer solution INHALE THE CONTENTS OF 1 VIAL(3 ML) VIA NEBULIZER BY MOUTH EVERY 4 HOURS AS NEEDED 75 mL 0    Albuterol/Ipratropium (COMBIVENT RESPIMAT) 20-100 mcg/actuation Inhaler Take 1 puff by inhalation every 4 hours if needed. 4 g 0    Albuterol/Ipratropium (COMBIVENT RESPIMAT) 20-100 mcg/actuation Inhaler Take 1 puff by inhalation 4 times daily. 3 inhaler 3    Aspirin 81 mg Chewable  Tablet Take 81 mg by mouth every day.      Atorvastatin (LIPITOR) 40 mg tablet Take 1 tablet by mouth every day. 90 tablet 3    Azelastine Nasal (ASTELIN) 137 mcg (0.1 %) Spray Instill 2 sprays into EACH nostril 2 times daily. 1 bottle 0    Budesonide/Formoterol (SYMBICORT) 160-4.5 mcg/actuation Inhaler Take 2 puffs by inhalation 2 times daily. 1 inhaler 11    CHANTIX CONTINUING MONTH BOX 1 mg Tablet TAKE 1 TABLET BY MOUTH TWICE DAILY 56 tablet 3    Clobetasol (TEMOVATE) 0.05 % Ointment APPLY SPARINGLY EXTERNALLY TO THE AFFECTED AREA TWICE DAILY 60 g 0    FamoTIDine (PEPCID) 40 mg Tablet Take 1 tablet by mouth every day. 90 tablet 11    Fluticasone (FLONASE) 50 mcg/actuation nasal spray Instill 1-2 sprays into EACH nostril every day. (allergies) 16 g 5    Lisinopril (PRINIVIL, ZESTRIL) 20 mg Tablet TAKE 1 TABLET BY MOUTH EVERY DAY FOR BLOOD PRESSURE 90 tablet 0    PredniSONE (DELTASONE) 20 mg Tablet Take 2 tablets by mouth every day. take 2 tablets by mouth once daily for 5 days 10 tablet 0    Tiotropium (SPIRIVA RESPIMAT) 2.5 mcg/actuation Inhaler Take 2 sprays by inhalation every day. 4 g 11    Varenicline (CHANTIX STARTING MONTH BOX) 0.5 mg (11)- 1 mg (42) tablet Take 0.5 mg tablet daily for 3 days, then take 0.5 mg tablet twice daily for 4 days, then take 1 mg tablet twice daily. 53 tablet 3     No current facility-administered medications for  this visit.        PHYSICAL EXAM:  BP 110/70 (SITE: left arm, Orthostatic Position: sitting, Cuff Size: regular)   Pulse 80   Temp 36.3 C (97.3 F) (Tympanic)   Resp 20   Wt 80 kg (176 lb 5.9 oz)   SpO2 96%   BMI 22.64 kg/m   General Appearance: healthy, alert, no distress, pleasant affect, cooperative.  Neck:  Neck supple. No adenopathy, thyroid symmetric, normal size.  Heart:  normal rate and regular rhythm, no murmurs, clicks, or gallops.  Lungs: distant breath sounds throughout.  Extremities:  no cyanosis, clubbing, or edema.         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 Patient verbalizes understanding of teaching and instructions.

## 2018-05-04 NOTE — Nursing Note (Signed)
Patient ID confirmed using 2 identifiers.    Vital signs taken, allergies verified, screened for pain, and med history taken.     Mammography screening for females between 50-72 years of age reviewed as appropriate.    Flu Vaccine status reviewed and updated as appropriate.      My Chart status reviewed and updated as appropriate.    I have asked the patient if they have received any medical treatment/consultations outside of the [facility] within the past 30 days.  The patient has indicated no to receiving services outside of the [facility] system.  If yes, I have requested information to ascertain these results

## 2018-05-09 ENCOUNTER — Other Ambulatory Visit: Payer: Self-pay | Admitting: Family Medicine

## 2018-05-12 NOTE — Telephone Encounter (Signed)
Last office visit on 05/04/2018,last filled 02/03/2018.

## 2018-05-21 ENCOUNTER — Ambulatory Visit: Payer: Medicare Other | Admitting: Dermatology

## 2018-05-28 ENCOUNTER — Other Ambulatory Visit: Payer: Self-pay | Admitting: Family Medicine

## 2018-05-28 DIAGNOSIS — L409 Psoriasis, unspecified: Principal | ICD-10-CM

## 2018-05-28 NOTE — Telephone Encounter (Signed)
Last rf 03/03/18, last ov 05/04/18.

## 2018-06-09 ENCOUNTER — Telehealth: Payer: Self-pay | Admitting: Family Medicine

## 2018-06-09 NOTE — Telephone Encounter (Signed)
PA is required for this medication. Per insurance, the following are the preferred alternatives:   Augmented betamethasone dipropionate or betamethasone dipropionate ointment or betamethasone valerate ointment, Desoximetasone cream, Fluocinonide ointment, Fluticasone ointment, Halobetasol ointment, Mometasone ointment, Triamcinolone ointment or Triderm.  Please advise. Thank you.

## 2018-06-09 NOTE — Telephone Encounter (Signed)
You're very welcome!

## 2018-06-09 NOTE — Telephone Encounter (Signed)
Clobetasol oin 0.05 % not covered. Temporary supply of drug was given. Pt has medicare. Please change or PA.

## 2018-06-09 NOTE — Telephone Encounter (Addendum)
PA has been approved until 03/25/2019. Pharmacy has been advised. Thank you.

## 2018-06-09 NOTE — Telephone Encounter (Signed)
I will presume it is appropriate as Derm did not recommend change to alternative.

## 2018-06-09 NOTE — Telephone Encounter (Signed)
Is PA an option for this?    Thank you.

## 2018-06-09 NOTE — Telephone Encounter (Addendum)
Would it be appropriate to state for PA that the formulary alternatives are less appropriate/affective for patient use?

## 2018-06-11 ENCOUNTER — Encounter: Payer: Self-pay | Admitting: Dermatology

## 2018-06-11 ENCOUNTER — Ambulatory Visit: Payer: Medicare Other | Admitting: Dermatology

## 2018-06-11 DIAGNOSIS — L409 Psoriasis, unspecified: Principal | ICD-10-CM

## 2018-06-11 DIAGNOSIS — L400 Psoriasis vulgaris: Secondary | ICD-10-CM

## 2018-06-11 NOTE — Progress Notes (Signed)
Ok I will give him call tomorrow Victorino Dike Aldridge-Grant sr.LVN  Thanks for CC'ing the chart

## 2018-06-11 NOTE — Video Visit Notes (Signed)
Video Visit Note     I performed this clinical encounter by utilizing a real time telehealth video connection between my location and the patient's location. The patient's location was confirmed during this visit. I obtained verbal consent from the patient to perform this clinical encounter utilizing video and prepared the patient by answering any questions they had about the telehealth interaction.        CHIEF COMPLAINT: psoriasis    06/11/2018  Last seen 02/13/2018    I had the pleasure of seeing [name], a 30yr male in f/u via video visit.    The patient's chief concern is psoriasis.     The HPI is noted below in the integrated section HPI/Exam/Assessment/Plan      Past Medical History:   History of skin cancer: None    Family History:                                             Skin cancer:None    Social History:  The patient does smoke but does not use tobacco products.    Allergies:  No Known Allergies    Review of Systems: No fevers, chills, weight loss, headache or cough.       Exam:  On physical examination, the patient is well developed, well nourished, and pleasant with normal affect. The patient is alert to time, place, and person.    The following areas were inspected  Scalp, head and face, eyelids and conjunctiva, teeth, gums, tongue, lips, neck, right and left upper extremities.     Lymph node basins were palpated in the neck    The findings were normal except for the following pertinent abnormal findings listed in the integrated HPI/Exam/Assessment/Plan section below    Labs/Imaging reviewed today:   Lab Results   Lab Name Value Date/Time    WBC 7.3 02/18/2018 09:46 AM    WBC 8.2 12/30/2014 07:14 AM    HGB 15.0 02/18/2018 09:46 AM    HGB 16.5 12/30/2014 07:14 AM    HCT 44.1 02/18/2018 09:46 AM    HCT 49.6 12/30/2014 07:14 AM    PLT 201 02/18/2018 09:46 AM    PLT 197 12/30/2014 07:14 AM     Lab Results   Lab Name Value Date/Time    NA 140 02/18/2018 09:46 AM    NA 139  12/30/2014 07:14 AM    K 4.3 02/18/2018 09:46 AM    K 4.3 12/30/2014 07:14 AM    CL 102 02/18/2018 09:46 AM    CL 100 12/30/2014 07:14 AM    CO2 30 02/18/2018 09:46 AM    CO2 30 12/30/2014 07:14 AM    BUN 14 02/18/2018 09:46 AM    BUN 17 12/30/2014 07:14 AM    CR 1.00 02/18/2018 09:46 AM    CR 0.97 12/30/2014 07:14 AM    GLU 90 02/18/2018 09:46 AM    GLU 89 12/30/2014 07:14 AM      Lab Results   Lab Name Value Date/Time    AST 18 02/18/2018 09:46 AM    AST 20 12/30/2014 07:14 AM    ALT 14 02/18/2018 09:46 AM    ALT 16 12/30/2014 07:14 AM    ALP 52 02/18/2018 09:46 AM    ALP 49 12/30/2014 07:14 AM    ALB 3.6 02/18/2018 09:46 AM    ALB 3.8 12/30/2014  07:14 AM    TP 6.3 02/18/2018 09:46 AM    TP 6.5 12/30/2014 07:14 AM    TBIL 0.9 02/18/2018 09:46 AM    TBIL 0.9 12/30/2014 07:14 AM       Lab Results   Lab Name Value Date/Time    CHOL 177 02/18/2018 09:46 AM    CHOL 182 12/30/2014 07:14 AM    LDLC 108 02/18/2018 09:46 AM    LDLC 123 12/30/2014 07:14 AM    HDL 54 02/18/2018 09:46 AM    HDL 44 12/30/2014 07:14 AM    TRIG 75 02/18/2018 09:46 AM    TRIG 77 12/30/2014 07:14 AM         Impression     HPI/Exam/Assessment/Plan:    # Plaque Psoriasis:   HX: Patient reports hx of psoriasis with pink scaly lesions on extremities and back for many years. Associated with mild intermittent itching and occasional discomfort. Patient has used clobetasol with mild improvement. Denies joint pain.  * At last visit, given large BSA of 20%, NBUVB was started along with acitretin 25mg  daily. Today pt reports acitretin caused stomach upset so he stopped taking it after 2 doses. Sx improved. He has had 15 NBUVB txs with improvement. He is unsure why further appts weren't scheduled. He reports his skin is much improved with clobetasol ointment- currently uses bid.   PE: well demarcated erythematous round plaques on the elbows  AP:    - improved  - restart NBUVB, will contact phototherapy nurse  - Continue Clobetasol ointment 0.05% twice  daily. Do not use more than 45 grams of Clobetasol weekly. Continue as needed. Discussed risks of long term use of topical steroids including thinning of the skin, striae, telangiectasias, increased susceptibility to skin infections, allergy to the medication.  - RTC in 3 months      Medication Review: We discussed risks, benefits, and proper use of dermatological medications with the patient who verbalized understanding.   Education Needs: Education needs were identified. There were no barriers to understanding, and the patient's questions and concerns were addressed.   Handouts Given and Explained: None        The patient was asked to follow up in the clinic in 3 months.     Education needs were addressed and there were no barriers to learning.     Cassie Freer, MD  Associate Physician  Department of Dermatology  [employer] [facility] [employer]    Approximately 10 minutes were spent with the patient, of which more than 50% of the time was spent in counseling and/or coordinating care on psoriasis. The patient understands and agrees with the plan of care outlined.    I performed this clinical encounter by utilizing a real time telehealth video connection between my location and the patients location. The patients location was confirmed during this visit. I obtained verbal consent from the patient to perform this clinical encounter utilizing video and prepared the patient by answering any questions they had about the telehealth interaction.

## 2018-06-11 NOTE — Progress Notes (Signed)
Then we are good I will give him

## 2018-06-11 NOTE — Progress Notes (Signed)
Is the original order from 02/10/18 ok?

## 2018-06-12 ENCOUNTER — Telehealth: Payer: Self-pay | Admitting: Dermatology

## 2018-06-12 NOTE — Telephone Encounter (Signed)
I have attempted to contact this patient by phone with the following results: message left to return my call with patient per pt request.

## 2018-06-24 ENCOUNTER — Other Ambulatory Visit: Payer: Self-pay | Admitting: Family Medicine

## 2018-06-24 DIAGNOSIS — J410 Simple chronic bronchitis: Principal | ICD-10-CM

## 2018-06-24 NOTE — Telephone Encounter (Signed)
Last refill: 05/01/2018  Last OV: 05/04/2018

## 2018-06-29 ENCOUNTER — Other Ambulatory Visit: Payer: Self-pay | Admitting: Internal Medicine

## 2018-06-29 DIAGNOSIS — J449 Chronic obstructive pulmonary disease, unspecified: Principal | ICD-10-CM

## 2018-06-29 DIAGNOSIS — J45909 Unspecified asthma, uncomplicated: Secondary | ICD-10-CM

## 2018-07-03 ENCOUNTER — Telehealth: Payer: Self-pay

## 2018-07-03 NOTE — Telephone Encounter (Signed)
Pulmonary Rehabilitation Telephone Encounter     I have attempted to contact this patient with the following results: Three patient identifiers confirmed. Spoke to [employer] regarding upcoming PR program. Informed him about the Zoom PR program due to [employer] recommendations. He is comfortable with this format.    Verified the following for the Zoom PR:  Device for Zoom app: Has smartphone, iPad and desktop  Monitoring equipment: Has BP monitor (wrist cuff), scale. Needs to purchase pulse oximeter  Lives in active living community  Exer: TM and exercise gym at community living  Health status: stable. Denies falls    Confirmed following appointments:  Exercise Eval: 07/27/2018 at 930  MD Eval: 08/10/18 at 945  Program days: T/TH 10:30-12:30  Program session: May 27 to September 30, 2018 via Zoom video visit    I will send another set of questionnaires (pdf via email) and information on pulse oximeter via MyChart.   Email address: [email or web address]    Algis Downs, MHAL, BA, RRT-NPS, RCP  Pulmonary Rehabilitation Program Supervisor  [phone]

## 2018-07-04 ENCOUNTER — Other Ambulatory Visit: Payer: Self-pay | Admitting: Internal Medicine

## 2018-07-04 DIAGNOSIS — J45909 Unspecified asthma, uncomplicated: Secondary | ICD-10-CM

## 2018-07-04 DIAGNOSIS — J449 Chronic obstructive pulmonary disease, unspecified: Principal | ICD-10-CM

## 2018-07-06 NOTE — Telephone Encounter (Signed)
Service: Fellows   Last visit: 04/01/18    Background:Per note Plan is: Controller- Symbicort (ICS/LABA), Spiriva (LAMA)    Future visit: 09/16/18    Prescription refilled per RN prescription refill protocol.     Edwin Cap, RN

## 2018-07-14 ENCOUNTER — Telehealth: Payer: Self-pay

## 2018-07-14 NOTE — Telephone Encounter (Signed)
Pulmonary Rehabilitation Note:  07/14/18  3:35 PM    The patient's chart was opened and reviewed for Pulmonary Rehabilitation exercise evaluation.    Pulmonary Rehabilitation Welcome packet mailed to address on file.    Julieta Bellini, BS, RRT-ACCS, RCP  Pulmonary Rehabilitation Program Coordinator  Pulmonary Disease Educator

## 2018-07-27 ENCOUNTER — Telehealth: Payer: Self-pay

## 2018-07-27 NOTE — Telephone Encounter (Signed)
Pulmonary Rehabilitation Telephone Encounter     01/19/2018  12:38 PM    Patient called with the following results: Three patient identifiers confirmed. [name] called because he thought he had a video visit with [place] today. I verified his upcoming Pulmonary Rehabilitation appointment with him:    May 11 at 9:30  May 18 at 9:45    I also informed [name] that we are looking at transitioning back to center-base PR and will have a better idea of what the program times when we see him next week. [name] stated he was fine this transition.    [name], MHAL, BA, RRT-NPS, RCP  Pulmonary Disease Educator  Pulmonary Rehabilitation Program Supervisor  [phone]

## 2018-07-31 ENCOUNTER — Other Ambulatory Visit: Payer: Self-pay | Admitting: Family Medicine

## 2018-07-31 DIAGNOSIS — J301 Allergic rhinitis due to pollen: Secondary | ICD-10-CM

## 2018-07-31 MED ORDER — FLUTICASONE PROPIONATE 50 MCG/ACTUATION NASAL SPRAY,SUSPENSION: 1.0000 | Freq: Every day | NASAL | 5 refills | Status: DC

## 2018-07-31 NOTE — Telephone Encounter (Signed)
This is a  REFILL AUTHORIZATION for the faxed request for prescription number [phone].Last date filled 08/27/17.

## 2018-08-03 ENCOUNTER — Other Ambulatory Visit: Payer: Self-pay | Admitting: Family Medicine

## 2018-08-03 ENCOUNTER — Ambulatory Visit: Payer: Medicare Other | Attending: Internal Medicine

## 2018-08-03 DIAGNOSIS — J301 Allergic rhinitis due to pollen: Secondary | ICD-10-CM

## 2018-08-03 DIAGNOSIS — J449 Chronic obstructive pulmonary disease, unspecified: Secondary | ICD-10-CM | POA: Insufficient documentation

## 2018-08-03 DIAGNOSIS — J432 Centrilobular emphysema: Secondary | ICD-10-CM | POA: Insufficient documentation

## 2018-08-03 MED ORDER — FLUTICASONE PROPIONATE 50 MCG/ACTUATION NASAL SPRAY,SUSPENSION: 1.0000 | Freq: Every day | NASAL | 1 refills | Status: DC

## 2018-08-03 NOTE — Telephone Encounter (Signed)
Received fax from pharmacy.   Patient is asking for a 90 day supply at a time.

## 2018-08-03 NOTE — Progress Notes (Signed)
Pulmonary Rehabilitation Program Intake Evaluation    Note Started: 08/03/2018, 3:08 PM  Date of Service: 08/03/2018    Two patient identifiers verified:  Name, DOB  Check in time: 0930  Check out time: 1115    Upon arrival Kenneth Gordon was wearing a surgical mask. He denied the following symptoms: fever, cough, shortness of breath or difficulty breathing, chills, repeated shaking with chills muscle pain, headache, sore throat, new loss of taste or smell, diarrhea. Patient roomed in Exam room 12 and completed thorough handwashing prior to the evaluation starting.    Kenneth Gordon is a [age]yr male with a diagnosis of COPD 4, history of asthma, chronic bronchitis, [facility] (PRP[facility] by Dr. Larey DresserMatthys.  Kenneth Gordon was fully evaluated by a Pulmonary Rehabilitation Program Coordinator. Kenneth Gordon functional performance and home-based activity were assessed by the PRP Coordinator by interview and the following modalities:     Aerobic endurance using Six Minute Walk Test   Lower body strength using Chair sit-to-stand   Upper body strength using One arm bicep curls   Berg Balance test to assess fall risk    Based on this evaluation it was determined that Kenneth Gordon should  be [facility].    Vitals Pre Exercise Post Exercise  Comments   Heart Rate 78 85    Respiratory Rate 30 30    SpO2  98% on RA 97% on RA    RPD/ RPE 0 / 0 0 / 0    Pain Rating (0-10) 0 / 10 0 / 10    Pain Location NA NA    Right BP sitting 112/70 109/68    Left BP sitting 114/68     Left BP standing 120/76     Right BP standing 121/73     Breath Sounds Diminished with expiratory wheezes      Temperature 97.9  Oral     THR (220-age): 60% THR: 80% THR: Cardiac Meds:   148 89 118 Aspirin, lisinopril     PFT / Date: 10/06/2017 Results   FVC 52%   FEV1 24%   FEV1/FVC 34%   TLC 79%   DLCO 39%   DLCO Adj 65%   MIP/MEP      1st  6MWT    Pulse Ox  Type  MR5 Location  Forehead Oxygen  RA Oxygen Device  none   Walking Data   Start Time SpO2  HR RPD/RPE Comments   1 Minute 100% 96     2 Minute 100% 100 4 / 4 Rest 2:50 - 3:15 (patient in tripod stance)   3 Minute 100% 102     4 Minute 99% 100     5 Minute 98% 101 4 / 4  Rest 5:00 - 5:20 (patient in tripod stance)   6 Minute 97% 102 5 / 4 "Left calf tightening up"   Distance  300 meters Rest Time  45 Walk Time  5 minutes 15 seconds Notes:         Body Mass  Results   Height (inches) 74   Weight (lbs.) 176.4   BMI (kg/m2) 22.9   Waist (inches) 40.5   Neck (inches) 15.5   Mallampati pending   Questionnaires  Scores   PHQ9  p   GAD7 p   SGRQ Symptoms p   SGRQ Activity p   SGRQ Impact p   SGRQ Total  p    SOB p   MMRC p   CAT (QOL) p   ACT (Asthma only) NA   PSQI p   STOP BANG p   EPWORTH p   BODE index p   GAP index NA     Bicep Curl # Reps RPD/RPE SpO2 on LPM HR   8# 12 2 / 7 93% on RA 93   Sit to Stands # Reps RPD/RPE SpO2 on LPM HR   OBW 11 3 / 5 93% on RA 98       BERG Balance Test    Sitting to standing 4   Standing unsupported 4   Sitting unsupported 4   Standing to sitting 4   Transfers 4   Standing unsupported with eyes closed (10 seconds) 4   Standing unsupported w/feet together (1 minute) 4   Reaching forward with outstretched arm  (10 inches) 4   Retrieving object from the floor 4   Turning to look behind left/right shoulder 4   Turning 360 degrees both directions (> 4 seconds) 4   Alt foot step while standing unsupported (8 in 20  sec) 4   Standing unsupported w/ one foot front  (30 seconds) 4   Standing on one foot (>10 seconds) 4   Total 56/56   Interpretation: 41-56 (low fall risk) 21-40 (medium fall risk) 0-20 (high fall risk)    Barriers to Learning assessed: none  Patient verbalizes understanding of teaching and instruction through teach back.     The complete evaluation on this patient required 1 hours 45 minutes     Report completed by:   Julieta Bellini, BS, RRT-ACCS, RCP  Pulmonary  Rehabilitation Program Coordinator  Pulmonary Disease Educator    Supervising physician: Dr. Magnus Ivan

## 2018-08-04 ENCOUNTER — Telehealth: Payer: Self-pay

## 2018-08-04 NOTE — Telephone Encounter (Signed)
Pulmonary Rehabilitation Telephone Encounter     08/04/2018  1:45 PM    I have attempted to contact this patient with the following results: Three patient identifiers confirmed. Follow-up call regarding exercise eval yesterday.  Patient feels fine after exercise, bicep and legs ok, breathing also ok.  Patient started Chantix today to quit smoking.  Patient confirmed for T/TH 9-10:30 AM    Julieta Bellini, BS, RRT-ACCS, RCP  Pulmonary Rehabilitation Program Coordinator  Pulmonary Disease Educator

## 2018-08-05 ENCOUNTER — Telehealth: Payer: Self-pay | Admitting: Family Medicine

## 2018-08-05 ENCOUNTER — Encounter: Payer: Self-pay | Admitting: Family Medicine

## 2018-08-05 DIAGNOSIS — J441 Chronic obstructive pulmonary disease with (acute) exacerbation: Secondary | ICD-10-CM

## 2018-08-05 DIAGNOSIS — J45909 Unspecified asthma, uncomplicated: Secondary | ICD-10-CM

## 2018-08-05 MED ORDER — PREDNISONE 20 MG TABLET
40.0000 mg | ORAL_TABLET | Freq: Every day | ORAL | 0 refills | Status: AC
Start: 2018-08-05 — End: 2018-08-10

## 2018-08-05 NOTE — Telephone Encounter (Signed)
3 patient identifiers used.  Per:   patient.      ClearTriage Note: Kenneth Gordon is a 73yr old male  pt states his COPD is "really acting up" pt states has not been able to do any activities without increased difficulty breathing since last night.  had PFE yesterday and felt ok, but had increased symptoms since last night.   States previously has been prescribed prednisone by Dr. Derrek Gu and has gotten good relief with that.   134/77 bp, hr 84, O2 sat 95% on room air.  pt speaking in full sentences during triage.  requesting rx for five day course of prednisone.    Protocol Used: Breathing Difficulty (Adult)  Protocol-Based Disposition: Go to ED Now  Override (Final) Disposition: Consult MD  Override Reason: Caller refused suggested disposition    Positive Triage Question:  * [1] MODERATE difficulty breathing (e.g., speaks in phrases, SOB even at rest, pulse 100-120) AND [2] NEW-onset or WORSE than normal    Negative Triage Questions:  * [1] Breathing stopped AND [2] hasn't returned  * Choking on something  * SEVERE difficulty breathing (e.g., struggling for each breath, speaks in single words)  * Bluish (or gray) lips or face now  * Difficult to awaken or acting confused (e.g., disoriented, slurred speech)  * Passed out (i.e., lost consciousness, collapsed and was not responding)  * Wheezing started suddenly after medicine, an allergic food or bee sting  * Stridor  * Slow, shallow and weak breathing  * Sounds like a life-threatening emergency to the triager    Disposition: Consult with MD, please advise to above.  Declined ER at this time  Request Rx from MD, pharmacy verified  Per:   patient verbalizes agreement to plan. Agrees to callback with any increase in symptoms/concerns or questions.     Floyde Parkins, RN  PCN Domingo Dimes

## 2018-08-05 NOTE — Telephone Encounter (Signed)
I sent in the prednisone, but needs to be seen for this in future either by [place] or pulmonology.

## 2018-08-05 NOTE — Telephone Encounter (Signed)
From: [name]  To: [name]. Maple Hudson, MD  Sent: 08/05/2018 11:01 AM PDT  Subject: Preventive Care    Hi Dr.    Well, you haven't seen me yet but my former primary care physician was Dr. Derrek Gu. I am having trouble showering and getting dressed now because of my COPD and was wondering if I could get a prescription for Prednisone which has solved the problem last year.     Thanks in Advance    [employer]

## 2018-08-05 NOTE — Telephone Encounter (Signed)
3 patient identifiers used  Kenneth Gordon is a 72yr old male    Reviewed results and message from MD with pt. Questions were answered and no further questions or concerns at this time. Advised to please callback to the advice line at any time if questions arise.    Floyde Parkins, RN  PCN [facility]

## 2018-08-06 NOTE — Telephone Encounter (Signed)
Please refer to TE 08/05/2018.  Clayton Lefort, RN  PCN Triage

## 2018-08-06 NOTE — Telephone Encounter (Signed)
Last refilled yesterday . duplicate

## 2018-08-07 ENCOUNTER — Telehealth: Payer: Self-pay

## 2018-08-07 NOTE — Progress Notes (Incomplete)
INDIVIDUALIZED TREATMENT PLAN:   Pulmonary Rehabilitation/Respiratory Therapy Services/Date: 08/10/2018  Kenneth Gordon  is a 73yrold male with a diagnosis of COPD 4[place]. History of asthma, chronic bronchitis, L popliteal aneurysm s/p sent in 10/2016, HTN, PAD . Patient is trying to quit smoking and has a prescription for Chantix.  He verbalizes that he started smoking again during the shelter-in order during [place]. His last cigarette was morning of 08/03/2018 and wants to quit. Patient acknowledges that he cannot be smoking during the program.  Drainage coughing up. No issues, coughing up white mucus.  RTALBOT MONARCHwas [facility]. 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 [employer]      [place]  Symptoms  AE/hospitalizations  Functional limits  HRQoL    Patient Goals: Patient states ***    Past Medical History:   Diagnosis Date    Allergic rhinitis     Aneurysm of left popliteal artery 6cm     04/2016 stent in left popliteal artery-> 10/2016 thrombectomy/stent graft of left posterior tib  artery    COPD (chronic obstructive pulmonary disease) (HCC)     Essential hypertension     Hematuria     10/2016 -> neg cystoscopy ;  on xarelto x 1 month after angioplasty/stent graft of left popliteal artery, thromboembolectomy of tibioperoneal/post tibial arteries    PAD (peripheral artery disease) (HCC)     Psoriasis     Tobacco dependence        PFT Results: 10/06/2017  FVC 52%  FEV1 24% FEV1/FVC 34%  TLC 79% DLCO 39% Adj DLCO 65%    Patient Readiness to participate in PR program:  Contemplative    Initial Exercise Assessment/Date: 08/10/2018  Current exercise:  ? None/Sedentary   ? Barriers to exercise: SOB  ? Pain: No    o Location: n/a    o Rating (0-10): 0/10  o Treatment: n/a  ? Initial 6MWT:   o Distance (meters): 300    o Speed: 1.96 RPD/RPE: 5/4   o METS: 2.45  ? Berg Balance score:  56/56     o Fall risk: Low     Functional Fitness tests:   ? One arm bicep curl: 12 reps    RPD/RPE: 2/7  ? Sit to stand: 11 reps    RPD/RPE: 3/5  ? Physical deconditioning: Yes  ? No regular exercise/sedentary lifestyle: Yes  ? Knowledge deficit of exercise guidelines and safety: Yes  ? Decreased exercise tolerance: Yes  ? Exercise-induced hypoxemia: Yes    Exercise Plan  Exercise Goals:  Aerobic exercise as advised by PRP coordinators under PRP medical director supervision.  ? Frequency:  2 times per week in pulmonary rehabilitation/therapy + independent exercise 2-3 days per week  ? Intensity: 60-80% of peak exercise capacity on 6MWT = MET level of 1.47 to 1.96; RPD 4-6/10, RPE 4-6/10  ? Time:  30-60 minutes   ? Type: Walking, Treadmill, NuStep, Recumbent Bicycle, Arm Ergometer, Elliptical     Resistance training  ? Frequency:  3 times per week (2 supervised)  ? Intensity:  Resistance adequate to allow 10-20 reps for each muscle group  ? Time:  1-3 sets of 10-20 reps of upper and lower extremity exercises  ? Type:  UBE: Thera-Band, hand weights, own body weight.  LBE: Seated leg extensions, own body weight for repetitive sit-to-stand and standing toe/heel raises    Balance & Flexibility training  ? Frequency:  3 times per week (2 supervised)  ? Intensity:  NA  ? Time:  1 set of 10 reps  Type:  Ankle circles, toe tapping, single limb stance, standing march     Functional Capacity Goals  ? Increase 6MWT distance by > 30 meters  ? Increase physical activity > 20 min/day  ? Demonstrate proper dyspnea control with pursed-lip breathing     EXERCISE PRESCRIPTION  DO NOT EXERCISE PATIENT WITH PRE-EXERCISE VITALS OF:  ? BP > 140 or < 90/50  ? HR > 100 or < 60   ? SpO2 < 90   ? RR > 24 or < 8     Begin the following exercise modalities 2-3 times per week for 7 weeks as follows:    Endurance Training Increase by 10% to keep SpO2 >92%, HR between 89 to 118 bpm, RPD 4-6/10, RPE 4-6/10  ? Treadmill: Begin at 1.2 mph for 5 to 20 minutes for MET level of 1.47 to 1.96.  ? Recumbent Stepper: Begin at load of 1 for 5 to 20 minutes for MET level of 1.47 to 1.96.   ? Recumbent Bicycle: Begin at level 1 for 5 to 20 minutes   ? Arm Ergometer: Begin at level 1 for 5 to 20 minutes     ? Hallway Walking: Begin for 5 to 20 minutes     Note: Progress individualized exercise program based on participant responses to exercise, including HR, SpO2, BP, RPD, RPE and symptoms of exercise tolerance. Exercise intensity will be progressed individually and titrated based on RPD 4-6. Progression of exercise training volume will result from increases in time, intensity, and frequency. Initial emphasis will be on increasing time.    Resistance Training Increase by at least 10% when your RPE is < 4, as tolerated to keep RPD 4-6/10   Resistance Bands/Weights/Hoist Machine:   Begin with yellow resistance band/5# weight. 1-3 sets of 10-15 reps.  ? Bicep curls  ? Upright rows or Lateral Arm Raises  ? Chest Press  ? Back Pull   ? Triceps Extension  ? Sit-to-Stands    May order and administer Albuterol 0.083%, 2.98m/3ml unit dose via hand-held nebulizer times 1 PRN for wheezing and shortness of breath. Notify referring physician if symptoms do not improve or additional bronchodilator treatments are needed.     May utilize supplemental oxygen 1-4 liters/min via nasal cannula during exercise to keep SpO2 greater than 92%. NOTE: if needed, may use high flow nasal cannula, simple mask, oxy mask, oxymizer oxygen conserver if more comfortable and if indicated.     May utilize TAV Sidekick with TAV nasal pillow. Adjust settings as tolerated by patient to keep SpO2 >92%, HR between 89 to 118 bpm, RPD 4-6/10, RPE 4-6/10 if indicated.     May dispense and instruct patient on Peak Expiratory Flow (PEF) meter as  tolerated by HR, SpO2, RPD and RPE. PEF measures the peak flow for monitoring conditions such as Asthma if indicated.     May dispense and instruct patient on Oscillating PEP device as tolerated by HR, SpO2, RPD and RPE. PEP device opens weak or collapsed airways to mobilize and assist mucociliary clearance to the upper airways where it can be coughed out if indicated.     Exercise Intervention/Education:  ? Prescribe an individualized exercise program based on initial evaluation findings,  including 6MWT results  ? Review benefits and core components of exercise program  ? Review exercise safety guidelines  ? Review RPD and RPE scales to monitor exertion and dyspnea levels  ? Monitor pain rating and modify exercise Rx, as needed  ? Titrate supplemental oxygen per Supplemental Oxygen Policy to maintain SpO2 > 92% during exercise training  ? Prescribe an individualized home-base exercise program  ? Instruct on proper respiratory medication use prior to exercise  ? Instruct on paced breathing with exercise & activity  ? Review Physical Activity log  ? Review home exercise guidelines  ? Discuss effects of exercise on blood glucose and optimal schedule for exercise  ? Train patient on IMT if indicated     Exercise  Reassessment   Date: Final/Discharge   Date:   Aerobic Exercise:  Participated in aerobic     exercise in PR   Frequency:  Intensity (METS):  Time:  Type:   Frequency:  Intensity (METS):  Time:  Type:   Resistance Exercise:  UBE:   Bicep curls   Upright rows/Lateral arm raises   Chest press   Back pulls   Triceps extension  LBE:   Sit to stand   Reclined sit-ups     Frequency:  Intensity:  Time:  Type:            Own body weight     Frequency:  Intensity:  Time:  Type:            Own body weight   Balance Exercise:   Ankle circles, toe tapping, single limb stance, standing march   Frequency:  Intensity:  Time:  Type: Own body weight   Frequency:  Intensity:  Time:  Type: Own body weight    Participates in     independent home     exercises:     Education: Benefits of    Exercise (completed, offered, pending)     Progress toward Goals:   Goals met, Goals not met, OR Showing progress       Exercise Reassessment/Date:   ? Increased participation in physical activities:  ? Improved functional capacity with improved endurance and strength:  ? Increased MET tolerance:  ? Modifications required due to:    Exercise Discharge/Follow-up/Date:    ? Increased participation in physical activities:  ? Improved functional capacity with improved endurance and strength:  ? Increased MET tolerance:  ? Modifications required due to:    NUTRITION ASSESSMENT   Initial Nutrition Assessment/Date: 08/10/2018  ? Admit Height: 72 inches  ? Admit Weight: 176.4 lbs.  ? Admit BMI: 22.6 kg/m2  ? Admit Waist Circumference: 40.5 inches  ? Nutrition/weight status: WNL  ? Recent change in weight: no   ? Difficulty with no chewing, swallowing, digestion, bloating, constipation, diarrhea, shortness of breath with meals/after meals  ? Noticed at dinner, can only eat so much in a sitting. Tired during meals at night. Two meals and snacks. Dinner served at senior living    Knowledge Deficit in management of:  ? Diabetes: No   o A1C: N/A  ? CHF: No   o Base weight: N/A  ? Osteoporosis: No   o Vitamin D/ Calcium supplement: No    Nutrition Plan  Nutrition Goals:  ? Maintain weight if stable   ? Show progress to healthier BMI range 21-25 kg/m2  ? Waist circumference <40 inches (100cm) in male to decrease risk of heart disease  ? Non-fasting BG 80-240 mg/dl  ? Improve  understanding of role of exercise in weight control  ? Improve understanding of weight control with prednisone, if indicated  ? Improve self-management of diabetes, if indicated   ? Improve self-management of CHF, if indicated     Nutrition Interventions/Education:  ? Review BMI, WC and identify target weight and strategies for weight control   ? Education using 3-day Food Diary for weight monitoring  ? Maintain weight   ? RD consult, as needed  ? Education: Nutrition - incorporating [email or web address]/, pulmonary nutrition strategies, role of supplements and prednisone     Nutrition  Reassessment   Date: Final/Discharge   Date:     BMI (kg/m2)         Weight (lbs.)         Waist circumference (inches)   Measured at final     Education: Nutrition Basics (completed, offered, pending)     Progress toward Goals: Goals met, Goals not met, OR Showing Progress       Nutrition Reassessment/Date:   Verbalizes understanding of the connection between  ? dietary adherence:  ? weight management:  ? exercise adherence with diabetes drug therapy:    Referral to Health Maintenance and Nutrition class or RD placed:    Nutrition Discharge/Follow-up/Date:   Rosezena Sensor understanding of the connection between  ? dietary adherence:  ? weight management:  ? exercise adherence with diabetes drug therapy:    Referral to Health Maintenance and Nutrition class or RD placed:    PSYCHOSOCIAL ASSESSMENT   Initial Psychosocial Assessment/Date: 08/10/2018  Verbalizes understanding of the connection between:  ? Patient self-reports yes, depression, loneliness. Talking with someone helps him.   Currently receiving mental health counseling: N/A   Currently using anti-depression medication: N/A   Currently using anti-anxiety medications: N/A   Potential for depression related to living with chronic illness: Yes   Potential for anxiety related to living with chronic illness: Yes   Panic: No   Perceived social isolation: No   Transportation:self   Lives alone   Living situation: senior living   Financial distress: No   Spiritual distress: No   PHQ9: ***   GAD7: ***   SGRQ Total: ***    Psychosocial Plan  Psychosocial Goals:   PHQ-9 score: Decrease to/Maintain < 5 PHQ9 score   GAD-7 score: Decrease to/Maintain < 5 GAD score   SGRQ Total score: Decrease by 4 points    Reports subjective improvement in mood with use of coping skills    Attend appointment with mental health counselor, as needed     Attend appointment with PCP for medication assistance, as needed    Adherence with anti-anxiety medications   Adherence with psychotropic medications    Psychosocial Interventions/Education:   Review screening tool results with patient   Engage patient to continually assess psychosocial status   Provide psychosocial support   Train in relaxation techniques and/or stress management strategies   Discuss benefits of exercise   Refer to Social Services, as needed   Refer for Mental Health counseling, as needed   Refer to PCP for Rx assistance, as needed   Education: Emotional Health and Well Being     Psychosocial  Reassessment   Date: Final/Discharge   Date:   Patient verbalizes/demonstrates management of anxiety     Patient verbalizes/demonstrates management of depression     PHQ-9 score  Score of <5 suggests minimal   depression  5-9 suggests mild depression  10-14 suggests moderate depression  15-19 suggests moderately severe depression  20-27 suggests severe depression     GAD score  Score of <5 suggests a minimal    anxiety  5-9 suggests mild anxiety  10-14 suggests moderate anxiety  15-20 suggests severe anxiety     SGRQ:  ? Symptoms  ? Activity  ? Impact  ? Total         Scored at final SGRQ:  Symptoms:  Activity:  Impact:  Total:   Education: Emotional Health and Well-being  (Completed, offered, pending)     Progress toward Goals:  (Goals met, Goals not met, OR Showing progress)       Psychosocial Reassessment/Date:  Attends appointment with:  ? Social services:   ? Mental health counselor:   ? MD for medication assistance:     Adherent to:  ? Anti-depression medication:   ? Adherent to anti-anxiety medication:     Patient is satisfied with current support system:     Psychosocial Discharge/Follow-up/Date:  Attends appointment with:  ? Social services:    ? Mental health counselor:   ? MD for medication assistance:     Adherent to:  ? Anti-depression medication:   ? Adherent to anti-anxiety medication:     Patient is satisfied with current support system:     OXYGEN ASSESSMENT   Initial Oxygen Assessment/Date: 08/10/2018  Initial SpO2: 98% on room air   ? Patient currently does not require supplemental O2   ? Patient reports adherence to O2 Rx: N/A  ? Needs O2 Rx for rest, ADLs, sleep and exercise: N/A  ? Inadequate knowledge on O2 systems, use and safety  ? Inadequate knowledge on how to monitor SpO2 with pulse oximeter    Oxygen Plan  Oxygen Goals:  ? SpO2 remains >90% at rest and >92% with exercise without use of supplemental oxygen   ? Verbalize/demonstrate proper O2 use and safety principles  ? Verbalize/demonstrates how to monitor SpO2 using pulse oximeter  ? Adherence with O2 use as prescribed    Oxygen Interventions/Education:  ? Monitor SpO2 at rest & with exercise  ? Provide supplemental O2 to maintain SpO2 > 90% at rest and SpO2 > 92% with exercise (per O2 Titration Policy)   ? Discuss benefits of adherence to supplemental oxygen as prescribed  ? Train in how to monitor SpO2 using pulse oximetry  ? Train in appropriate use of O2 at rest and with exercise    ? Train in O2 systems, safety, and the benefits of adherence to supplemental oxygen as prescribed  ? Recommend appropriate O2 to patient physician & PRP medical director, if indicated  ? Assist patient with DME for home O2 & portable O2, if indicated  ? Reinforce principles of oxygen use, safety, and travel  ? HAST ordered, if indicated    Oxygen  Reassessment  Date: Final/Discharge  Date:   Continue to monitor SpO2 at rest and with exercise       Supplemental oxygen   requirements to maintain   SpO2 > 92% during individual exercise modes Oxygen LPM on:  NuStep:   Recumbent bike:   Arm Ergometer:   Treadmill:   ARC Trainer:  Oxygen LPM on:  NuStep:   Recumbent bike:   Arm Ergometer:   Treadmill:    ARC Trainer:    Education:  Oxygen and Travel   (completed, offered, pending)     Progress toward Goals:  (Goals met, Goals not met, OR Showing progress)       Oxygen Reassessment/Date:  ? SpO2 remains >  90% at rest and > 92% with exercise with/without the use of supplemental oxygen:   ? Patient verbalizes/demonstrates understanding of how to use own pulse oximeter:   ? Supplemental oxygen has been titrated as exercise intensity has been progressed and depending on mode of exercise to keep SpO2 > 92%:     Oxygen Discharge/Final/Date:  ? SpO2 remains > 90% at rest and > 92% with exercise with/without the use of supplemental oxygen:   ? Patient verbalizes/demonstrates understanding of how to use own pulse oximeter:   ? Supplemental oxygen has been titrated as exercise intensity has been progressed and depending on mode of exercise to keep SpO2 > 92%:     OTHER CORE MEASURES/RISK FACTORS ASSESSMENTS:  Incorrect Inhaled Medication Use and/or Technique; Ineffective Secretion Clearance; Frequent Respiratory Infections or Disease Exacerbations; Altered Sleep Pattern; Activities of Daily Living     MEDICATIONS: Medications were reviewed and confirmed in the EMR and with patient.     Current Outpatient Medications on File Prior to Visit   Medication Sig Dispense Refill    Acitretin (SORIATANE) 25 mg Capsule Take 1 capsule by mouth every morning with a meal. 30 capsule 11    Albuterol (ACCUNEB) 1.25 mg/3 mL nebulizer solution INHALE THE CONTENTS OF 1 VIAL(3ML) VIA NEBULIZER EVERY 4 HOURS AS NEEDED 75 mL 0    Albuterol/Ipratropium (COMBIVENT RESPIMAT) 20-100 mcg/actuation Inhaler Take 1 puff by inhalation every 4 hours if needed. 4 g 0    Albuterol/Ipratropium (COMBIVENT RESPIMAT) 20-100 mcg/actuation Inhaler Take 1 puff by inhalation 4 times daily. 3 inhaler 3    Aspirin 81 mg Chewable Tablet Take 81 mg by mouth every day.      Atorvastatin (LIPITOR) 40 mg tablet Take 1 tablet by mouth every day. 90 tablet 3     Azelastine Nasal (ASTELIN) 137 mcg (0.1 %) Spray Instill 2 sprays into EACH nostril 2 times daily. 1 bottle 0    CHANTIX CONTINUING MONTH BOX 1 mg Tablet TAKE 1 TABLET BY MOUTH TWICE DAILY 56 tablet 3    FamoTIDine (PEPCID) 40 mg Tablet Take 1 tablet by mouth every day. 90 tablet 11    Fluticasone (FLONASE) 50 mcg/actuation nasal spray Instill 1-2 sprays into EACH nostril every day. (allergies) 16 g 5    Lisinopril (PRINIVIL, ZESTRIL) 20 mg Tablet TAKE 1 TABLET BY MOUTH EVERY DAY FOR BLOOD PRESSURE 90 tablet 0    PredniSONE (DELTASONE) 20 mg Tablet Take 2 tablets by mouth every day. take 2 tablets by mouth once daily for 5 days 10 tablet 0    SYMBICORT 160-4.5 mcg/actuation Inhaler INHALE 2 PUFFS BY MOUTH TWICE DAILY 10.2 g 11    Tiotropium (SPIRIVA RESPIMAT) 2.5 mcg/actuation Inhaler Take 2 sprays by inhalation every day. 4 g 11    Varenicline (CHANTIX STARTING MONTH BOX) 0.5 mg (11)- 1 mg (42) tablet Take 0.5 mg tablet daily for 3 days, then take 0.5 mg tablet twice daily for 4 days, then take 1 mg tablet twice daily. 53 tablet 3     No current facility-administered medications on file prior to visit.        INHALED MEDICATION USED AND/OR TECHNIQUE ASSESSMENT   Initial Inhaled Medication Assessment/Date: 08/10/2018  ? Patient reports medication adherence: Yes  ? Uses correct inhaled medication technique: No  ? MDI medication: symbicort   ? DPI medication: none  ? SMI medication: combivent, spiriva  ? Nebulizer medication: albuterol  ? Spacer: Yes  ? CAT score (for COPD and QOL): ***     ?  ACT score (for asthma): ***  ? Peak flow meter (for asthma): No  ? In Check Dial assessed: No    Inhaled Medication Plan  Inhaled Medication Goals:  ? Adheres to prescribed medications  ? Demonstrates proper technique, timing and care when using inhaled respiratory medications     Inhaled Medication Interventions/Education:  ? Review medication list, purpose, schedule, side-effects and importance of 100% adherence   ? Instruct on correct technique, timing & proper cleaning and care of inhaled medication devices  ? Return demo use of MDI with spacer, DPI and nebulizer  ? Review schedule/timing of inhaled medications  ? Review when to replace MDI, DPI, and nebulizer    Respiratory Medication  Reassessment  Date: Final/Discharge  Date:   CAT score  <10 low impact  10-20 medium impact  21-30 high impact  >30 very high impact               ACT score  <15 very poorly controlled  16-19 not well controlled  >19 well-controlled       Education: Medications  (completed, offered, pending)     Progress toward Goals:  (Goals met, Goals not met, OR Showing progress)       Inhaled Medication Reassessment/Date:   ? Use inhaled medications as directed 100% of the time:   ? Demonstrates proper technique when using inhaled respiratory medication:   ? Demonstrates correct technique and timing of respiratory medications:   ? Demonstrates understanding/verbalizes proper cleaning/care of inhaled medication devices:   ? Peak flow meter (for asthma):  ? In Check Dial assessed:     Inhaled Medication Discharge/Follow-up/Date:  ? Use inhaled medications as directed 100% of the time:   ? Demonstrates proper technique when using inhaled respiratory medication:   ? Demonstrates correct technique and timing of respiratory medications:   ? Demonstrates understanding/verbalizes proper cleaning/care of inhaled medication devices:   ? Peak flow meter (for asthma):  ? In Check Dial assessed:     SECRETION CLEARANCE ASSESSMENT   Initial Secretion Clearance Assessment/Date: 08/10/2018  ? Patient reports productive cough: Yes with infections and sinus drainage   ? Patient reports difficulty clearing secretions from airway: No    Secretion Clearance Plan:   Secretion Clearance Goals:  ? Demonstrate effective cough  ? Effective secretion clearance with controlled coughing  ? Effective secretion clearance with use of secretion mobilization device, if indicated     Secretion Clearance Interventions/Education:  ? Instruct on importance of secretion clearance and managing of nasal congestion   ? Instruct on controlled cough, sputum evaluation and when to call physician  ? Train in secretion clearance devices, proper use and cleaning of airway device, if indicated  ? Instruct on importance of hydration and role of exercise in secretion clearance     Secretion  Reassessment   Date: Final/Discharge   Date:   Education: Breathing Retraining and Secretion Mobilization (completed, offered, pending)     Progress toward Goals: Goals met, Goals not met, OR Showing progress       Secretion Clearance Reassessment/Date:   ? Mobilizes effectively with demonstration with direct controlled cough technique:   ? Demonstrates effective cough:   ? Demonstrates proper technique with airway clearance device:   ? Describes proper cleaning procedure for airway clearance device:     Secretion Clearance Discharge/Follow/Date:   ? Mobilizes effectively with demonstration with direct controlled cough technique:   ? Demonstrates effective cough:   ? Demonstrates proper technique with airway clearance device:   ?  Describes proper cleaning procedure for airway clearance device:     RESPIRATORY INFECTION PREVENTION AND MANAGEMENT ASSESSMENT   Initial Respiratory Infection Assessment/Date: 08/10/2018  Patient reports:  ? Number of respiratory infections/exacerbations in the last year: 0  ? Number of ED visits in the last year: 0  ? Number of hospitalizations in the last year: 0  ? Patient can describe signs and symptoms of lung infection: No  ? Patient can describe when to contact physician: No  ? Patient has been given an emergency action plan by their physician: No  ? Influenza vaccine up-to-date: Yes  ? Pneumonia vaccine up-to-date: Yes    Respiratory Infection/Exacerbation Plan  Respiratory Infection/Exacerbation Goals:    ? Adherence with vaccination recommendations for influenza and pneumonia   ? Demonstrates proper hand hygiene  ? Describe signs and symptoms of infection/exacerbation  ? Describe methods to identify and prevent infection/exacerbation  ? Describe use of an action plan  ? Demonstrate proper implementation of the action plan  ? No ED visits or hospitalizations related to respiratory causes    Respiratory Infection/Exacerbation Interventions/Education:  ? Discuss exacerbation prevention and management  ? Discuss need and procedure to clean respiratory equipment  ? Discuss importance of influenza vaccine and Pneumovax  ? Discuss importance of good oral and hand hygiene, hydration, and evaluating sputum  ? Explain the signs and symptoms of sinus infection, and increased risk of respiratory infection  ? Discuss prompt notification of physician in the presence of symptoms of respiratory infection or disease exacerbation    Respiratory Infection/Exacerbation Reassessment   Date: Final/Discharge   Date:   Education: Respiratory Exacerbations, Triggers, and Action Plan (completed, offered, pending)     Progress toward Goals: Goals met, Goals not met, OR Showing progress       Respiratory Infection/Exacerbation Reassessment/Date:   ? Patient demonstrates proper hand hygiene:   ? Verbalizes understanding of and rationale of good oral hygiene:   ? Verbalizes/demonstrates proper procedure of cleaning respiratory equipment:   ? Verbalizes understanding of action plan and when to call the physician:     Respiratory Infection/Exacerbation Discharge/Follow-up/Date:  ? Patient demonstrates proper hand hygiene:   ? Verbalizes understanding of and rationale of good oral hygiene:   ? Verbalizes/demonstrates proper procedure of cleaning respiratory equipment:   ? Verbalizes understanding of action plan and when to call the physician:     ALTERED SLEEP PATTERN ASSESSMENT   Initial Sleep Assessment/Date: 08/10/2018  ? Patient reports: good sleep habits  ? Blood Gas:   o Date: ***  o Venous/Arterial***  o PO2: ***    o PCO2: ***  o pH: ***    o HCO3: ***   o FiO2: ***   ? Sleep medications: no  ? Opioids: no   ? Sleep intervention: none   ? Patient reports adherence to prescribed sleep intervention: N/A  ? Barriers to adherence to prescribed sleep intervention: N/A  ? PSQI: ***  ? STOP BANG: ***  ? EPWORTH: ***  ? Sleeps 4-6 hours, wakes to use bathroom. If sinus drainage is an issue, he stays up for 1 hour to clear his head.    Sleep Plan  Sleep Goals:  ? Verbalize/demonstrate proper use of sleep intervention: None   ? Adherence to sleep intervention as prescribed  ? Describes optimal sleep hygiene practice    Sleep Intervention/Education:  ? Teach on relationship between sleep and chronic lung disease  ? Discuss relationship between excess weight, work of breathing, and sleep apnea  ?  Discuss relationship between increased neck circumference (> 18 inches) and risk for obstructive sleep apnea  ? Discuss purpose of CPAP, BIPAP, AVAPS, oxygen and importance of long-term adherence during sleep  ? Discuss optimal sleep hygiene practices  ? Refer for Sleep Study, if indicated    Sleep  Reassessment   Date: Final/Discharge   Date:   PSQI: out of 27   <5 good sleep quality  5-9 moderate sleep disturbance  10 or > significant sleep disturbance           Scored at final    EPWORTH Sleepiness score: out of 24  0-10 Normal range  11-14 mild sleepiness  15-17 moderate sleepiness  18 and > severe sleepiness           Scored at final    Education: Sleep and Chronic Lung Disease (completed, offered, pending)     Progress toward Goals: Goals met, Goals not met, OR Showing progress       Sleep Reassessment/Date:   ? Verbalizes understanding of relationship between sleep and chronic lung disease:  ? Verbalizes understanding of relationship between excess weight, work of breathing, and sleep apnea:  ? Verbalizes understanding of relationship between increased neck circumference (> 18 inches) and risk for obstructive sleep apnea:   ? Verbalizes understanding of purpose of CPAP, BIPAP, AVAPS, oxygen and importance of long-term adherence during sleep:  ? Verbalizes practicing recommended sleep hygiene measures:    Sleep Discharge/Final/Date:   ? Verbalizes understanding of relationship between sleep and chronic lung disease:  ? Verbalizes understanding of relationship between excess weight, work of breathing, and sleep apnea:  ? Verbalizes understanding of relationship between increased neck circumference (> 18 inches) and risk for obstructive sleep apnea:  ? Verbalizes understanding of purpose of CPAP, BIPAP, AVAPS, oxygen and importance of long-term adherence during sleep:  ? Verbalizes practicing recommended sleep hygiene measures:    ACTIVITIES OF DAILY LIVING (ADL) ASSESSMENT   Initial ADL Assessment/Date: ***  ? Patient reports: Impaired ADL management with bending over, picking up things, stairs  ? Fear and/or severe dyspnea with stairs and/or incline: Yes  ? RPD rating stairs/incline: 3  ? Need for OT evaluation: No  ? Need for assistive device: No  ? [place]: ***/120  ? MMRC: ***    ADL Plan   ADL Goals:  ? Patient reports ADL management with control of dyspnea  ? Patient reports ADL performance with pacing and dyspnea control  ? Patient reports dyspnea control/pacing with stairs and incline  ? OT evaluation, as needed  ? Assistive device evaluation recommendations & resources, as needed     ADL Intervention/Education:  Train patient on:  ? ADL performance with pacing, dyspnea control  ? Train in dyspnea control/pacing with stairs and incline  ? OT evaluation, as needed  ? Assistive device evaluation recommendations & resources     Activities of Daily Living Reassessment   Date: Final/Discharge   Date:     [place] SOB: out of 120       MMRC:        Education: Nurse, adult (completed, offered, pending)     Progress toward Goals: Goals met, Goals not met, OR Showing progress       ADL Reassessment/Date:    ? Use the 4 Ps: Planning, Posture, Pacing and PLB with ADLs:  ? Patient uses PLB and pacing with ramp and stairs:    ADL Discharge/Follow/Date:   ? Use the 4 Ps: Planning, Posture, Pacing and  PLB with ADLs:  ? Patient uses PLB and pacing with ramp and stairs:    PALLIATIVE CARE ASSESSMENT   Initial Palliative Care Assessment/Date: ***  ? Patient reported symptoms on ESASr:  pain, tiredness, drowsiness, nausea, lack of appetite, SOB, depression, anxiety   ? Patient understands difference between hospice and palliative care:  Yes [id number]  ? Patient has completed advance directive and POLST forms:  Yes [id number]  ? Patient has had a discussion with health care provider most comfortable with:  Yes [id number]  ? BODE index (COPD patients only): ***  ? GAP index (IPF patients only): ***    Palliative Care Plan  Palliative Care Goals:  ? Identify patient symptoms to help manage them  ? Ensure patient is as comfortable and active as possible  ? Assist and support patient and family with difficult medical decisions  ? Coordinate care and treatment among doctors during all stages of illness  ? Identify services that can support patient and family after completing     Palliative Care Plan Intervention/Education:  ? Discuss difference in hospice and palliative care  ? Discuss importance of having a discussion on the Go Wish cards with family/friends   ? Discuss purpose of having an advance directive and POLST form completed  ? Discuss important is having a discussion with your health care provider you're most comfortable with  ? Refer to [facility] team for consultation, if needed or requested    Palliative  Reassessment   Date: Final/Discharge   Date:     BODE index for COPD   Scored at final      GAP index for IPF   Scored at final       Verbalizes having an Scientist, physiological and POLST form completed     Education: What is Palliative Care by the [facility] Team  (completed, offered, pending)     Progress toward Goals: Goals met, Goals not met, OR Showing progress       Palliative Care Reassessment/Date:   ? Understands difference between hospice and palliative care:  ? Patient has completed advance directive and POLST forms:  ? Patient has had a discussion with health care provider most comfortable with:  ? Verbalizes understanding of difference in hospice and palliative care:  ? Verbalizes understanding of the Five Wishes with family/friends:    Palliative Care Discharge/Final/Date:   ? Patient reported symptoms on ESASr: pain, tiredness, drowsiness, nausea, lack of appetite, SOB, depression, anxiety  ? Understands difference between hospice and palliative care:  ? Patient has completed advance directive and POLST forms:  ? Patient has had a discussion with health care provider most comfortable with:  ? Verbalizes understanding of difference in hospice and palliative care:  ? Verbalizes understanding of the Five Wishes with family/friends:    Patient  Outcomes  Initial Assessment  Date: *** Reassessment      Date: Discharge/Final           Date: Additional   Comments   # of Sessions completed at discharge         6MWT           Distance (meters)   *** Held until Discharge/Final     MPH ***      Max HR ***      Low SpO2 ***      RPD ***      RPE ***      METS ***  Functional Fitness       Bicep Curls ***      Sit to Stand ***      BERG Balance ***      Body Mass        Height (inches) ***      Weight (lbs.) ***      BMI (kg/m2) ***      Waist (inches) ***      Neck (inches) ***      Questionnaires        PHQ9  ***      GAD7 ***      SGRQ Total ***      [place] SOB ***      MMRC ***      CAT   (COPD & QoL) ***      ACT   (Asthma only) ***      PSQI ***      STOP BANG ***      EPWORTH ***        Initial Assessment signed by  ***  Pulmonary Rehabilitation Coordinator  Date: ***    Initial Assessment/Medical Director   This patient was seen, evaluated and care plan was developed with the Pulmonary Rehabilitation Coordinator on ***. I agree with the assessment and plan as outlined in her note.

## 2018-08-07 NOTE — Telephone Encounter (Signed)
Pulmonary Rehabilitation Telephone Encounter     08/07/2018  2:25 PM    I have attempted to contact this patient with the following results: Three patient identifiers confirmed. MD evaluation appt with Dr. Dorethea Clan changed to Friday 08/14/2018 at 9:45 AM. Patient confirmed that this would be okay.    Julieta Bellini, BS, RRT-ACCS, RCP  Pulmonary Rehabilitation Program Coordinator  Pulmonary Disease Educator

## 2018-08-10 ENCOUNTER — Other Ambulatory Visit: Payer: Self-pay | Admitting: FAMILY PRACTICE

## 2018-08-10 ENCOUNTER — Ambulatory Visit: Payer: Self-pay | Admitting: Internal Medicine

## 2018-08-10 NOTE — Telephone Encounter (Signed)
LAST REEFILL 05/13/18  LAST O.V 05/04/18 with Dr Derrek Gu.  Pt has future appt on 08/27/18

## 2018-08-13 ENCOUNTER — Telehealth: Payer: Self-pay

## 2018-08-13 NOTE — Telephone Encounter (Signed)
Pulmonary Rehabilitation Telephone Encounter    08/13/2018 1:50 PM    [name] was contacted regarding a pulmonary rehabilitation appoinment.  A voicemail was left for Abrahan to return call at [phone].Since he has already been through the exercise evaluation, I advised Nadine Counts to keep his appointment with Dr. Dorethea Clan. We will discuss future plans for PR and ways to quit smoking.  Nadine Counts agreed and will come tomorrow 08/14/2018 for 9:45 AM appointment.      Julieta Bellini, BS, RRT-ACCS, RCP  Pulmonary Rehabilitation Program Coordinator  Pulmonary Disease Educator

## 2018-08-13 NOTE — Progress Notes (Incomplete)
INDIVIDUALIZED TREATMENT PLAN:   Pulmonary Rehabilitation/Respiratory Therapy Services/Date: 08/14/2018    Kenneth Gordon  is a 73yrold male with a diagnosis of COPD 4-D and chronic bronchitis on triple therapy. 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 Patient reports he was [place]. Patient is a current smoker and is trying to quit.  He says he started smoking again during the stay-at-home order due to the [place]. Patient has a prescription for Chantix.  Recent exacerbation on May 13 and prescribed prednisone.  He has a productive cough with white sputum because of sinus drainage. A1AT test results M2M2.  IGE results high. RAST panel normal.    Patient reports shortness of breath when bending over, picking up things and take the stairs up. Impaired ADL's getting dressed and showering.  [facility] FRETTwas [facility]. 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 [employer]    Patient Goals: Patient states ***    Past Medical History:   Diagnosis Date    Allergic rhinitis     Aneurysm of left popliteal artery 6cm     04/2016 stent in left popliteal artery-> 10/2016 thrombectomy/stent graft of left posterior tib  artery    COPD (chronic obstructive pulmonary disease) (HCC)     Essential hypertension     Hematuria     10/2016 -> neg cystoscopy ;  on xarelto x 1 month after angioplasty/stent graft of left popliteal artery, thromboembolectomy of tibioperoneal/post tibial arteries    PAD (peripheral artery disease) (HCC)     Psoriasis     Tobacco dependence        PFT Results: 10/06/2017  FVC 52%  FEV1 24% FEV1/FVC 34%  TLC 79% DLCO 39% Adj DLCO 65%    Patient Readiness to participate in PR program:  Contemplative     Initial Exercise Assessment/Date: 08/14/2018  Current exercise:  ? None/Sedentary   ? Barriers to exercise: SOB  ? Pain: No    o Location: n/a    o Rating (0-10): 0/10  o Treatment: n/a  ? Initial 6MWT:   o Distance (meters): 300   o Speed: 1.96 RPD/RPE: 5/4   o METS: 2.45  ? Berg Balance score:  56/56     o Fall risk: Low     Functional Fitness tests:   ? One arm bicep curl: 12 reps    RPD/RPE: 2/7  ? Sit to stand: 11 reps    RPD/RPE: 3/5  ? Physical deconditioning: Yes  ? No regular exercise/sedentary lifestyle: Yes  ? Knowledge deficit of exercise guidelines and safety: Yes  ? Decreased exercise tolerance: Yes  ? Exercise-induced hypoxemia: Yes    Exercise Plan  Exercise Goals:  Aerobic exercise as advised by PRP coordinators under PRP medical director supervision.  ? Frequency:  2 times per week in pulmonary rehabilitation/therapy + independent exercise 2-3 days per week  ? Intensity: 60-80% of peak exercise capacity on 6MWT = MET level of 1.47 to 1.96; RPD 4-6/10, RPE 4-6/10  ? Time:  30-60 minutes   ? Type: Walking, Treadmill, NuStep, Recumbent Bicycle, Arm Ergometer, Elliptical     Resistance training  ? Frequency:  3 times per week (2 supervised)  ? Intensity:  Resistance adequate to allow 10-20 reps for each muscle  group  ? Time:  1-3 sets of 10-20 reps of upper and lower extremity exercises  ? Type:  UBE: Thera-Band, hand weights, own body weight.              LBE: Seated leg extensions, own body weight for repetitive sit-to-stand and standing toe/heel raises    Balance & Flexibility training  ? Frequency:  3 times per week (2 supervised)  ? Intensity:  NA  ? Time:  1 set of 10 reps  Type:  Ankle circles, toe tapping, single limb stance, standing march     Functional Capacity Goals  ? Increase 6MWT distance by > 30 meters  ? Increase physical activity > 20 min/day  ? Demonstrate proper dyspnea control with pursed-lip breathing     EXERCISE PRESCRIPTION  DO NOT EXERCISE PATIENT WITH PRE-EXERCISE VITALS OF:   ? BP > 140 or < 90/50  ? HR > 100 or < 60   ? SpO2 < 90   ? RR > 24 or < 8     Begin the following exercise modalities 2-3 times per week for 7 weeks as follows:   Endurance Training Increase by 10% to keep SpO2 >92%, HR between 89 to 118 bpm, RPD 4-6/10, RPE 4-6/10  ? Treadmill: Begin at 1.2 mph for 5 to 20 minutes for MET level of 1.47 to 1.96.  ? Recumbent Stepper: Begin at load of 1 for 5 to 20 minutes for MET level of 1.47 to 1.96.   ? Recumbent Bicycle: Begin at level 1 for 5 to 20 minutes   ? Arm Ergometer: Begin at level 1 for 5 to 20 minutes     ? Hallway Walking: Begin for 5 to 20 minutes     Note: Progress individualized exercise program based on participant responses to exercise, including HR, SpO2, BP, RPD, RPE and symptoms of exercise tolerance. Exercise intensity will be progressed individually and titrated based on RPD 4-6. Progression of exercise training volume will result from increases in time, intensity, and frequency. Initial emphasis will be on increasing time.    Resistance Training Increase by at least 10% when your RPE is < 4, as tolerated to keep RPD 4-6/10   Resistance Bands/Weights/Hoist Machine:   Begin with yellow resistance band/5# weight. 1-3 sets of 10-15 reps.  ? Bicep curls  ? Upright rows or Lateral Arm Raises  ? Chest Press  ? Back Pull   ? Triceps Extension  ? Sit-to-Stands    May order and administer Albuterol 0.083%, 2.43m/3ml unit dose via hand-held nebulizer times 1 PRN for wheezing and shortness of breath. Notify referring physician if symptoms do not improve or additional bronchodilator treatments are needed.     May utilize supplemental oxygen 1-4 liters/min via nasal cannula during exercise to keep SpO2 greater than 92%. NOTE: if needed, may use high flow nasal cannula, simple mask, oxy mask, oxymizer oxygen conserver if more comfortable and if indicated.     May utilize TAV Sidekick with TAV nasal pillow. Adjust settings as  tolerated by patient to keep SpO2 >92%, HR between 89 to 118 bpm, RPD 4-6/10, RPE 4-6/10 if indicated.     May dispense and instruct patient on Peak Expiratory Flow (PEF) meter as tolerated by HR, SpO2, RPD and RPE. PEF measures the peak flow for monitoring conditions such as Asthma if indicated.     May dispense and instruct patient on Oscillating PEP device as tolerated by HR, SpO2, RPD and RPE. PEP device opens  weak or collapsed airways to mobilize and assist mucociliary clearance to the upper airways where it can be coughed out if indicated.     Exercise Intervention/Education:  ? Prescribe an individualized exercise program based on initial evaluation findings, including 6MWT results  ? Review benefits and core components of exercise program  ? Review exercise safety guidelines  ? Review RPD and RPE scales to monitor exertion and dyspnea levels  ? Monitor pain rating and modify exercise Rx, as needed  ? Titrate supplemental oxygen per Supplemental Oxygen Policy to maintain SpO2 > 92% during exercise training  ? Prescribe an individualized home-base exercise program  ? Instruct on proper respiratory medication use prior to exercise  ? Instruct on paced breathing with exercise & activity  ? Review Physical Activity log  ? Review home exercise guidelines  ? Discuss effects of exercise on blood glucose and optimal schedule for exercise  ? Train patient on IMT if indicated     Exercise  Reassessment   Date: Final/Discharge   Date:   Aerobic Exercise:  Participated in aerobic     exercise in PR   Frequency:  Intensity (METS):  Time:  Type:   Frequency:  Intensity (METS):  Time:  Type:   Resistance Exercise:  UBE:   Bicep curls   Upright rows/Lateral arm raises   Chest press   Back pulls   Triceps extension  LBE:   Sit to stand   Reclined sit-ups     Frequency:  Intensity:  Time:  Type:            Own body weight     Frequency:  Intensity:  Time:  Type:            Own body weight   Balance Exercise:    Ankle circles, toe tapping, single limb stance, standing march   Frequency:  Intensity:  Time:  Type: Own body weight   Frequency:  Intensity:  Time:  Type: Own body weight   Participates in     independent home     exercises:     Education: Benefits of    Exercise (completed, offered, pending)     Progress toward Goals:   Goals met, Goals not met, OR Showing progress       Exercise Reassessment/Date:   ? Increased participation in physical activities:  ? Improved functional capacity with improved endurance and strength:  ? Increased MET tolerance:  ? Modifications required due to:    Inspiratory Muscle Training:  ? Frequency:   ? Intensity:  mm  ? Time:  10 minutes; 2 times daily  ? Type: Inspiratory Muscle Resistor    Exercise Discharge/Follow-up/Date:    ? Increased participation in physical activities:  ? Improved functional capacity with improved endurance and strength:  ? Increased MET tolerance:  ? Modifications required due to:    NUTRITION ASSESSMENT   Initial Nutrition Assessment/Date: 08/14/2018  ? Admit Height: 72 inches  ? Admit Weight: 176.4 lbs.  ? Admit BMI: 22.6 kg/m2  ? Admit Waist Circumference: 40.5 inches  ? Nutrition/weight status: WNL  ? Recent change in weight: no   ? Difficulty with no chewing, swallowing, digestion, bloating, constipation, diarrhea, shortness of breath with meals/after meals  ? Noticed at dinner, can only eat so much in a sitting. Tired during meals at night. Two meals and snacks. Dinner served at senior living    Knowledge Deficit in management of:  ? Diabetes: No   o A1C: N/A  ?  CHF: No   o Base weight: N/A  ? Osteoporosis: No   o Vitamin D/ Calcium supplement: No    Nutrition Plan  Nutrition Goals:  ? Maintain weight if stable   ? Show progress to healthier BMI range 21-25 kg/m2  ? Waist circumference <40 inches (100cm) in male to decrease risk of heart disease  ? Non-fasting BG 80-240 mg/dl  ? Improve understanding of role of exercise in weight control   ? Improve understanding of weight control with prednisone, if indicated  ? Improve self-management of diabetes, if indicated   ? Improve self-management of CHF, if indicated     Nutrition Interventions/Education:  ? Review BMI, WC and identify target weight and strategies for weight control  ? Education using 3-day Food Diary for weight monitoring  ? Maintain weight   ? RD consult, as needed  ? Education: Nutrition - incorporating [email or web address]/, pulmonary nutrition strategies, role of supplements and prednisone     Nutrition  Reassessment   Date: Final/Discharge   Date:     BMI (kg/m2)         Weight (lbs.)         Waist circumference (inches)   Measured at final     Education: Nutrition Basics (completed, offered, pending)     Progress toward Goals: Goals met, Goals not met, OR Showing Progress       Nutrition Reassessment/Date:   Verbalizes understanding of the connection between  ? dietary adherence:  ? weight management:  ? exercise adherence with diabetes drug therapy:    Referral to Health Maintenance and Nutrition class or RD placed:    Nutrition Discharge/Follow-up/Date:   Rosezena Sensor understanding of the connection between  ? dietary adherence:  ? weight management:  ? exercise adherence with diabetes drug therapy:    Referral to Health Maintenance and Nutrition class or RD placed:    PSYCHOSOCIAL ASSESSMENT   Initial Psychosocial Assessment/Date: 08/14/2018  Verbalizes understanding of the connection between:  ? Patient self-reports yes, depression, loneliness. Talking with someone helps him.   Currently receiving mental health counseling: N/A   Currently using anti-depression medication: N/A   Currently using anti-anxiety medications: N/A   Potential for depression related to living with chronic illness: Yes   Potential for anxiety related to living with chronic illness: Yes   Panic: No   Perceived social isolation: No   Transportation:self   Lives alone   Living situation: senior living    Financial distress: No   Spiritual distress: No   PHQ9: ***   GAD7: ***   SGRQ Total: ***    Psychosocial Plan  Psychosocial Goals:   PHQ-9 score: Decrease to/Maintain < 5 PHQ9 score   GAD-7 score: Decrease to/Maintain < 5 GAD score   SGRQ Total score: Decrease by 4 points   Reports subjective improvement in mood with use of coping skills    Attend appointment with mental health counselor, as needed     Attend appointment with PCP for medication assistance, as needed    Adherence with anti-anxiety medications   Adherence with psychotropic medications    Psychosocial Interventions/Education:   Review screening tool results with patient   Engage patient to continually assess psychosocial status   Provide psychosocial support   Train in relaxation techniques and/or stress management strategies   Discuss benefits of exercise   Refer to Social Services, as needed   Refer for Mental Health counseling, as needed   Refer to PCP for Rx assistance, as needed  Education: Emotional Health and Well Being     Psychosocial  Reassessment   Date: Final/Discharge   Date:   Patient verbalizes/demonstrates management of anxiety     Patient verbalizes/demonstrates management of depression     PHQ-9 score  Score of <5 suggests minimal   depression  5-9 suggests mild depression  10-14 suggests moderate depression  15-19 suggests moderately severe depression  20-27 suggests severe depression     GAD score  Score of <5 suggests a minimal    anxiety  5-9 suggests mild anxiety  10-14 suggests moderate anxiety  15-20 suggests severe anxiety     SGRQ:  ? Symptoms  ? Activity  ? Impact  ? Total         Scored at final SGRQ:  Symptoms:  Activity:  Impact:  Total:   Education: Emotional Health and Well-being  (Completed, offered, pending)     Progress toward Goals:  (Goals met, Goals not met, OR Showing progress)       Psychosocial Reassessment/Date:  Attends appointment with:  ? Social services:    ? Mental health counselor:   ? MD for medication assistance:     Adherent to:  ? Anti-depression medication:   ? Adherent to anti-anxiety medication:     Patient is satisfied with current support system:     Psychosocial Discharge/Follow-up/Date:  Attends appointment with:  ? Social services:   ? Mental health counselor:   ? MD for medication assistance:     Adherent to:  ? Anti-depression medication:   ? Adherent to anti-anxiety medication:     Patient is satisfied with current support system:     OXYGEN ASSESSMENT   Initial Oxygen Assessment/Date: 08/14/2018  Initial SpO2: 98% on room air   ? Patient currently does not require supplemental O2   ? Patient reports adherence to O2 Rx: N/A  ? Needs O2 Rx for rest, ADLs, sleep and exercise: N/A  ? Inadequate knowledge on O2 systems, use and safety  ? Inadequate knowledge on how to monitor SpO2 with pulse oximeter    Oxygen Plan  Oxygen Goals:  ? SpO2 remains >90% at rest and >92% with exercise without use of supplemental oxygen   ? Verbalize/demonstrate proper O2 use and safety principles  ? Verbalize/demonstrates how to monitor SpO2 using pulse oximeter  ? Adherence with O2 use as prescribed    Oxygen Interventions/Education:  ? Monitor SpO2 at rest & with exercise  ? Provide supplemental O2 to maintain SpO2 > 90% at rest and SpO2 > 92% with exercise (per O2 Titration Policy)   ? Discuss benefits of adherence to supplemental oxygen as prescribed  ? Train in how to monitor SpO2 using pulse oximetry  ? Train in appropriate use of O2 at rest and with exercise    ? Train in O2 systems, safety, and the benefits of adherence to supplemental oxygen as prescribed  ? Recommend appropriate O2 to patient physician & PRP medical director, if indicated  ? Assist patient with DME for home O2 & portable O2, if indicated  ? Reinforce principles of oxygen use, safety, and travel  ? HAST ordered, if indicated    Oxygen  Reassessment  Date: Final/Discharge  Date:    Continue to monitor SpO2 at rest and with exercise       Supplemental oxygen   requirements to maintain   SpO2 > 92% during individual exercise modes Oxygen LPM on:  NuStep:   Recumbent bike:   Arm Ergometer:  Treadmill:   ARC Trainer:  Oxygen LPM on:  NuStep:   Recumbent bike:   Arm Ergometer:   Treadmill:   ARC Trainer:    Education:  Oxygen and Travel   (completed, offered, pending)     Progress toward Goals:  (Goals met, Goals not met, OR Showing progress)       Oxygen Reassessment/Date:  ? SpO2 remains > 90% at rest and > 92% with exercise with/without the use of supplemental oxygen:   ? Patient verbalizes/demonstrates understanding of how to use own pulse oximeter:   ? Supplemental oxygen has been titrated as exercise intensity has been progressed and depending on mode of exercise to keep SpO2 > 92%:     Oxygen Discharge/Final/Date:  ? SpO2 remains > 90% at rest and > 92% with exercise with/without the use of supplemental oxygen:   ? Patient verbalizes/demonstrates understanding of how to use own pulse oximeter:   ? Supplemental oxygen has been titrated as exercise intensity has been progressed and depending on mode of exercise to keep SpO2 > 92%:     OTHER CORE MEASURES/RISK FACTORS ASSESSMENTS:  Incorrect Inhaled Medication Use and/or Technique; Ineffective Secretion Clearance; Frequent Respiratory Infections or Disease Exacerbations; Altered Sleep Pattern; Activities of Daily Living     MEDICATIONS: Medications were reviewed and confirmed in the EMR and with patient.     Current Outpatient Medications on File Prior to Visit   Medication Sig Dispense Refill    Acitretin (SORIATANE) 25 mg Capsule Take 1 capsule by mouth every morning with a meal. 30 capsule 11    Albuterol (ACCUNEB) 1.25 mg/3 mL nebulizer solution INHALE THE CONTENTS OF 1 VIAL(3ML) VIA NEBULIZER EVERY 4 HOURS AS NEEDED 75 mL 0    Albuterol/Ipratropium (COMBIVENT RESPIMAT) 20-100 mcg/actuation Inhaler  Take 1 puff by inhalation every 4 hours if needed. 4 g 0    Albuterol/Ipratropium (COMBIVENT RESPIMAT) 20-100 mcg/actuation Inhaler Take 1 puff by inhalation 4 times daily. 3 inhaler 3    Aspirin 81 mg Chewable Tablet Take 81 mg by mouth every day.      Atorvastatin (LIPITOR) 40 mg tablet Take 1 tablet by mouth every day. 90 tablet 3    Azelastine Nasal (ASTELIN) 137 mcg (0.1 %) Spray Instill 2 sprays into EACH nostril 2 times daily. 1 bottle 0    CHANTIX CONTINUING MONTH BOX 1 mg Tablet TAKE 1 TABLET BY MOUTH TWICE DAILY 56 tablet 3    FamoTIDine (PEPCID) 40 mg Tablet Take 1 tablet by mouth every day. 90 tablet 11    Fluticasone (FLONASE) 50 mcg/actuation nasal spray Instill 1-2 sprays into EACH nostril every day. (allergies) 16 g 5    Lisinopril (PRINIVIL, ZESTRIL) 20 mg Tablet TAKE 1 TABLET BY MOUTH EVERY DAY FOR BLOOD PRESSURE 90 tablet 0    PredniSONE (DELTASONE) 20 mg Tablet Take 2 tablets by mouth every day. take 2 tablets by mouth once daily for 5 days 10 tablet 0    SYMBICORT 160-4.5 mcg/actuation Inhaler INHALE 2 PUFFS BY MOUTH TWICE DAILY 10.2 g 11    Tiotropium (SPIRIVA RESPIMAT) 2.5 mcg/actuation Inhaler Take 2 sprays by inhalation every day. 4 g 11    Varenicline (CHANTIX STARTING MONTH BOX) 0.5 mg (11)- 1 mg (42) tablet Take 0.5 mg tablet daily for 3 days, then take 0.5 mg tablet twice daily for 4 days, then take 1 mg tablet twice daily. 53 tablet 3     No current facility-administered medications on file prior to visit.  INHALED MEDICATION USED AND/OR TECHNIQUE ASSESSMENT   Initial Inhaled Medication Assessment/Date: 08/14/2018  ? Patient reports medication adherence: Yes  ? Uses correct inhaled medication technique: No  ? MDI medication: symbicort   ? DPI medication: none  ? SMI medication: combivent, spiriva  ? Nebulizer medication: albuterol  ? Spacer: Yes  ? CAT score (for COPD and QOL): ***     ? ACT score (for asthma): ***  ? Peak flow meter (for asthma): No   ? In Check Dial assessed: No    Inhaled Medication Plan  Inhaled Medication Goals:  ? Adheres to prescribed medications  ? Demonstrates proper technique, timing and care when using inhaled respiratory medications     Inhaled Medication Interventions/Education:  ? Review medication list, purpose, schedule, side-effects and importance of 100% adherence  ? Instruct on correct technique, timing & proper cleaning and care of inhaled medication devices  ? Return demo use of MDI with spacer, DPI and nebulizer  ? Review schedule/timing of inhaled medications  ? Review when to replace MDI, DPI, and nebulizer    Respiratory Medication  Reassessment  Date: Final/Discharge  Date:   CAT score  <10 low impact  10-20 medium impact  21-30 high impact  >30 very high impact               ACT score  <15 very poorly controlled  16-19 not well controlled  >19 well-controlled       Education: Medications  (completed, offered, pending)     Progress toward Goals:  (Goals met, Goals not met, OR Showing progress)       Inhaled Medication Reassessment/Date:   ? Use inhaled medications as directed 100% of the time:   ? Demonstrates proper technique when using inhaled respiratory medication:   ? Demonstrates correct technique and timing of respiratory medications:   ? Demonstrates understanding/verbalizes proper cleaning/care of inhaled medication devices:   ? Peak flow meter (for asthma):  ? In Check Dial assessed:     Inhaled Medication Discharge/Follow-up/Date:  ? Use inhaled medications as directed 100% of the time:   ? Demonstrates proper technique when using inhaled respiratory medication:   ? Demonstrates correct technique and timing of respiratory medications:   ? Demonstrates understanding/verbalizes proper cleaning/care of inhaled medication devices:   ? Peak flow meter (for asthma):  ? In Check Dial assessed:     SECRETION CLEARANCE ASSESSMENT   Initial Secretion Clearance Assessment/Date: 08/14/2018   ? Patient reports productive cough: Yes with infections; daily productive cough; sinus congestion   ? Patient reports difficulty clearing secretions from airway: No    Secretion Clearance Plan:   Secretion Clearance Goals:  ? Demonstrate effective cough  ? Effective secretion clearance with controlled coughing  ? Effective secretion clearance with use of secretion mobilization device, if indicated    Secretion Clearance Interventions/Education:  ? Instruct on importance of secretion clearance and managing of nasal congestion   ? Instruct on controlled cough, sputum evaluation and when to call physician  ? Train in secretion clearance devices, proper use and cleaning of airway device, if indicated  ? Instruct on importance of hydration and role of exercise in secretion clearance     Secretion  Reassessment   Date: Final/Discharge   Date:   Education: Breathing Retraining and Secretion Mobilization (completed, offered, pending)     Progress toward Goals: Goals met, Goals not met, OR Showing progress       Secretion Clearance Reassessment/Date:   ? Mobilizes effectively  with demonstration with direct controlled cough technique:   ? Demonstrates effective cough:   ? Demonstrates proper technique with airway clearance device:   ? Describes proper cleaning procedure for airway clearance device:     Secretion Clearance Discharge/Follow/Date:   ? Mobilizes effectively with demonstration with direct controlled cough technique:   ? Demonstrates effective cough:   ? Demonstrates proper technique with airway clearance device:   ? Describes proper cleaning procedure for airway clearance device:     RESPIRATORY INFECTION PREVENTION AND MANAGEMENT ASSESSMENT   Initial Respiratory Infection Assessment/Date: 08/14/2018  Patient reports:  ? Number of respiratory infections/exacerbations in the last year: 1 (prescription for prednisone 08/05/2018)  ? Number of ED visits in the last year: 0   ? Number of hospitalizations in the last year: 0  ? Patient can describe signs and symptoms of lung infection: No  ? Patient can describe when to contact physician: No  ? Patient has been given an emergency action plan by their physician: No  ? Influenza vaccine up-to-date: Yes  ? Pneumonia vaccine up-to-date: Yes    Respiratory Infection/Exacerbation Plan  Respiratory Infection/Exacerbation Goals:    ? Adherence with vaccination recommendations for influenza and pneumonia  ? Demonstrates proper hand hygiene  ? Describe signs and symptoms of infection/exacerbation  ? Describe methods to identify and prevent infection/exacerbation  ? Describe use of an action plan  ? Demonstrate proper implementation of the action plan  ? No ED visits or hospitalizations related to respiratory causes    Respiratory Infection/Exacerbation Interventions/Education:  ? Discuss exacerbation prevention and management  ? Discuss need and procedure to clean respiratory equipment  ? Discuss importance of influenza vaccine and Pneumovax  ? Discuss importance of good oral and hand hygiene, hydration, and evaluating sputum  ? Explain the signs and symptoms of sinus infection, and increased risk of respiratory infection  ? Discuss prompt notification of physician in the presence of symptoms of respiratory infection or disease exacerbation    Respiratory Infection/Exacerbation Reassessment   Date: Final/Discharge   Date:   Education: Respiratory Exacerbations, Triggers, and Action Plan (completed, offered, pending)     Progress toward Goals: Goals met, Goals not met, OR Showing progress       Respiratory Infection/Exacerbation Reassessment/Date:   ? Patient demonstrates proper hand hygiene:   ? Verbalizes understanding of and rationale of good oral hygiene:   ? Verbalizes/demonstrates proper procedure of cleaning respiratory equipment:   ? Verbalizes understanding of action plan and when to call the physician:      Respiratory Infection/Exacerbation Discharge/Follow-up/Date:  ? Patient demonstrates proper hand hygiene:   ? Verbalizes understanding of and rationale of good oral hygiene:   ? Verbalizes/demonstrates proper procedure of cleaning respiratory equipment:   ? Verbalizes understanding of action plan and when to call the physician:     ALTERED SLEEP PATTERN ASSESSMENT   Initial Sleep Assessment/Date: 08/14/2018  ? Patient reports: good sleep habits  ? Blood Gas:   o Date: ***  o Venous/Arterial***  o PO2: ***   o PCO2: ***  o pH: ***    o HCO3: ***   o FiO2: ***   ? Sleep medications: no  ? Opioids: no   ? Sleep intervention: none   ? Patient reports adherence to prescribed sleep intervention: N/A  ? Barriers to adherence to prescribed sleep intervention: N/A  ? PSQI: ***  ? STOP BANG: ***  ? EPWORTH: ***  ? Sleeps 4-6 hours, wakes to use bathroom. If sinus  drainage is an issue, he stays up for 1 hour to clear his head.    Sleep Plan  Sleep Goals:  ? Verbalize/demonstrate proper use of sleep intervention: None   ? Adherence to sleep intervention as prescribed  ? Describes optimal sleep hygiene practice    Sleep Intervention/Education:  ? Teach on relationship between sleep and chronic lung disease  ? Discuss relationship between excess weight, work of breathing, and sleep apnea  ? Discuss relationship between increased neck circumference (> 18 inches) and risk for obstructive sleep apnea  ? Discuss purpose of CPAP, BIPAP, AVAPS, oxygen and importance of long-term adherence during sleep  ? Discuss optimal sleep hygiene practices  ? Refer for Sleep Study, if indicated    Sleep  Reassessment   Date: Final/Discharge   Date:   PSQI: out of 27   <5 good sleep quality  5-9 moderate sleep disturbance  10 or > significant sleep disturbance           Scored at final    EPWORTH Sleepiness score: out of 24  0-10 Normal range  11-14 mild sleepiness  15-17 moderate sleepiness  18 and > severe sleepiness           Scored at final     Education: Sleep and Chronic Lung Disease (completed, offered, pending)     Progress toward Goals: Goals met, Goals not met, OR Showing progress       Sleep Reassessment/Date:   ? Verbalizes understanding of relationship between sleep and chronic lung disease:  ? Verbalizes understanding of relationship between excess weight, work of breathing, and sleep apnea:  ? Verbalizes understanding of relationship between increased neck circumference (> 18 inches) and risk for obstructive sleep apnea:  ? Verbalizes understanding of purpose of CPAP, BIPAP, AVAPS, oxygen and importance of long-term adherence during sleep:  ? Verbalizes practicing recommended sleep hygiene measures:    Sleep Discharge/Final/Date:   ? Verbalizes understanding of relationship between sleep and chronic lung disease:  ? Verbalizes understanding of relationship between excess weight, work of breathing, and sleep apnea:  ? Verbalizes understanding of relationship between increased neck circumference (> 18 inches) and risk for obstructive sleep apnea:  ? Verbalizes understanding of purpose of CPAP, BIPAP, AVAPS, oxygen and importance of long-term adherence during sleep:  ? Verbalizes practicing recommended sleep hygiene measures:    ACTIVITIES OF DAILY LIVING (ADL) ASSESSMENT   Initial ADL Assessment/Date: 08/14/2018  ? Patient reports: Impaired ADL management with bending over, picking up things, stairs, getting dressed, and showering  ? Fear and/or severe dyspnea with stairs and/or incline: Yes  ? RPD rating stairs/incline: 3  ? Need for OT evaluation: No  ? Need for assistive device: No  ? [place]: ***/120  ? MMRC: ***    ADL Plan   ADL Goals:  ? Patient reports ADL management with control of dyspnea  ? Patient reports ADL performance with pacing and dyspnea control  ? Patient reports dyspnea control/pacing with stairs and incline  ? OT evaluation, as needed  ? Assistive device evaluation recommendations & resources, as needed      ADL Intervention/Education:  Train patient on:  ? ADL performance with pacing, dyspnea control  ? Train in dyspnea control/pacing with stairs and incline  ? OT evaluation, as needed  ? Assistive device evaluation recommendations & resources     Activities of Daily Living Reassessment   Date: Final/Discharge   Date:     [place] SOB: out of 120  MMRC:        Education: Nurse, adult (completed, offered, pending)     Progress toward Goals: Goals met, Goals not met, OR Showing progress       ADL Reassessment/Date:   ? Use the 4 Ps: Planning, Posture, Pacing and PLB with ADLs:  ? Patient uses PLB and pacing with ramp and stairs:    ADL Discharge/Follow/Date:   ? Use the 4 Ps: Planning, Posture, Pacing and PLB with ADLs:  ? Patient uses PLB and pacing with ramp and stairs:    PALLIATIVE CARE ASSESSMENT   Initial Palliative Care Assessment/Date: 08/14/2018  ? Patient reported symptoms on ESASr:  pain, tiredness, drowsiness, nausea, lack of appetite, SOB, depression, anxiety   ? Patient understands difference between hospice and palliative care: No  ? Patient has completed advance directive and POLST forms: No  ? Patient has had a discussion with health care provider most comfortable with: No  ? BODE index (COPD patients only): ***  ? GAP index (IPF patients only): NA    Palliative Care Plan  Palliative Care Goals:  ? Identify patient symptoms to help manage them  ? Ensure patient is as comfortable and active as possible  ? Assist and support patient and family with difficult medical decisions  ? Coordinate care and treatment among doctors during all stages of illness  ? Identify services that can support patient and family after completing     Palliative Care Plan Intervention/Education:  ? Discuss difference in hospice and palliative care  ? Discuss importance of having a discussion on the Go Wish cards with family/friends   ? Discuss purpose of having an advance directive and POLST form completed   ? Discuss important is having a discussion with your health care provider you're most comfortable with  ? Refer to [place] team for consultation, if needed or requested    Palliative  Reassessment   Date: Final/Discharge   Date:     BODE index for COPD   Scored at final      GAP index for IPF   Scored at final       Verbalizes having an Scientist, physiological and POLST form completed     Education: What is Palliative Care by the [place] Team (completed, offered, pending)     Progress toward Goals: Goals met, Goals not met, OR Showing progress       Palliative Care Reassessment/Date:   ? Understands difference between hospice and palliative care:  ? Patient has completed advance directive and POLST forms:  ? Patient has had a discussion with health care provider most comfortable with:  ? Verbalizes understanding of difference in hospice and palliative care:  ? Verbalizes understanding of the Five Wishes with family/friends:    Palliative Care Discharge/Final/Date:   ? Patient reported symptoms on ESASr: pain, tiredness, drowsiness, nausea, lack of appetite, SOB, depression, anxiety  ? Understands difference between hospice and palliative care:  ? Patient has completed advance directive and POLST forms:  ? Patient has had a discussion with health care provider most comfortable with:  ? Verbalizes understanding of difference in hospice and palliative care:  ? Verbalizes understanding of the Five Wishes with family/friends:    Patient  Outcomes  Initial Assessment  Date: 08/14/2018 Reassessment      Date: Discharge/Final           Date: Additional   Comments   # of Sessions completed at discharge  6MWT           Distance (meters)   300 Held until Discharge/Final     MPH 1.9      Max HR 102      Low SpO2 97      RPD 5      RPE 4      METS 2.45      Functional Fitness       Bicep Curls 12      Sit to Stand 11      BERG Balance 56      Body Mass        Height (inches) 74       Weight (lbs.) 176.4      BMI (kg/m2) 22.6      Waist (inches) 40.5      Neck (inches) 15.5      Questionnaires        PHQ9  ***      GAD7 ***      SGRQ Total ***      [place] SOB ***      MMRC ***      CAT   (COPD & QoL) ***      ACT   (Asthma only) ***      PSQI ***      STOP BANG ***      EPWORTH ***        Initial Assessment signed by  Pennie Rushing, BS, RRT-ACCS, RCP  Pulmonary Rehabilitation Program Coordinator  Pulmonary Disease Educator  Date: 08/14/2018    Initial Assessment/Medical Director  This patient was seen, evaluated and care plan was developed with the Pulmonary Rehabilitation Coordinator on 08/14/2018. I agree with the assessment and plan as outlined in her note.

## 2018-08-13 NOTE — Telephone Encounter (Signed)
Pulmonary Rehabilitation Telephone Encounter     08/13/2018  1:45 PM    I have attempted to contact this patient with the following results: Three patient identifiers confirmed. Called to confirm appointment for 08/14/2018 at 9:45 AM.  Pt verbalizes that is still smoking and thought he would have been quit before the start of the program.  I offered a referral to smoking cessation which patient declined.  He says he needs more time.  Patient is still interested in the program once he quits smoking.    Julieta Bellini, BS, RRT-ACCS, RCP  Pulmonary Rehabilitation Program Coordinator  Pulmonary Disease Educator

## 2018-08-14 ENCOUNTER — Ambulatory Visit: Payer: Self-pay | Admitting: Internal Medicine

## 2018-08-19 ENCOUNTER — Ambulatory Visit: Payer: Medicare Other | Attending: Dermatology

## 2018-08-19 DIAGNOSIS — L408 Other psoriasis: Secondary | ICD-10-CM | POA: Insufficient documentation

## 2018-08-19 NOTE — Nursing Note (Signed)
Referral to Phototherapy by: Doctor:  rehal  Changes in medication: none  Changes in topical/lotions: none  Patient is returning to phototherapy.  Patient tolerated treatment well.    See Doc Flowsheet for phototherapy visit.  D.L. Laurice Kimmons Sr. LVN

## 2018-08-21 ENCOUNTER — Ambulatory Visit: Payer: Medicare Other | Attending: Dermatology

## 2018-08-21 DIAGNOSIS — L408 Other psoriasis: Secondary | ICD-10-CM | POA: Insufficient documentation

## 2018-08-21 NOTE — Nursing Note (Signed)
Referral to Phototherapy by: Doctor:  rehal  Changes in medication: none  Changes in topical/lotions: none  Patient tolerated treatment well.    See Doc Flowsheet for phototherapy visit.  D.L. Tashima Scarpulla Sr. LVN

## 2018-08-24 ENCOUNTER — Ambulatory Visit: Payer: Medicare Other | Attending: Dermatology

## 2018-08-24 DIAGNOSIS — L408 Other psoriasis: Secondary | ICD-10-CM | POA: Insufficient documentation

## 2018-08-24 NOTE — Nursing Note (Signed)
Referral to Phototherapy by: Doctor:rehal  Reaction to last TX :no  Patient tolerated treatment well.   Changes to medication:no   See Doc Flowsheet for phototherapy visit.  Kenneth Gordon, LVN  Dermatology Department

## 2018-08-26 ENCOUNTER — Ambulatory Visit: Payer: Medicare Other | Attending: Dermatology

## 2018-08-26 DIAGNOSIS — L408 Other psoriasis: Secondary | ICD-10-CM | POA: Insufficient documentation

## 2018-08-26 NOTE — Nursing Note (Signed)
Referral to Phototherapy by: Doctor: Rehal  Reaction to last TX : none  Patient tolerated treatment well.   Changes to medication: none   See Doc Flowsheet for phototherapy visit.  Kenneth Gordon sr.LVN

## 2018-08-27 ENCOUNTER — Encounter: Payer: Self-pay | Admitting: Family Medicine

## 2018-08-27 ENCOUNTER — Ambulatory Visit: Payer: Medicare Other | Admitting: Family Medicine

## 2018-08-27 VITALS — BP 131/81 | HR 98 | Temp 97.6°F | Ht 74.0 in | Wt 170.6 lb

## 2018-08-27 DIAGNOSIS — I724 Aneurysm of artery of lower extremity: Secondary | ICD-10-CM

## 2018-08-27 DIAGNOSIS — Z720 Tobacco use: Secondary | ICD-10-CM

## 2018-08-27 DIAGNOSIS — Z23 Encounter for immunization: Secondary | ICD-10-CM

## 2018-08-27 DIAGNOSIS — I10 Essential (primary) hypertension: Secondary | ICD-10-CM

## 2018-08-27 DIAGNOSIS — J42 Unspecified chronic bronchitis: Secondary | ICD-10-CM

## 2018-08-27 MED ORDER — ATORVASTATIN 40 MG TABLET
40.0000 mg | ORAL_TABLET | Freq: Every day | ORAL | 3 refills | Status: DC
Start: 2018-08-27 — End: 2019-08-24

## 2018-08-27 NOTE — Progress Notes (Signed)
Chief Complaint: Follow-up blood pressure, establish care  SUBJECTIVE:  Kenneth Gordon is a 16yr male here for follow-up on his blood pressure.  He is also here to establish care with me today.  Previous patient Dr. Derrek Gu.    Has history of high blood pressure, currently on lisinopril 20 mg.  Denies any side effects.  No complaints of chest pain, headache, dizziness.  Pressures are generally well controlled.    Also with history of COPD.  Follows with pulmonology.  He unfortunately continues to smoke at least 1/4 pack a day today.  He has cut down recently.  Has been smoking for 40+ years.  He is on Spiriva, Symbicort, Combivent as needed.  Denies significant shortness of breath at rest, some with exertion.  Recently called in for a prednisone burst for a "mild flare" with significant relief.  He has had Chantix prescribed in the past which he is not taking currently.    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  No issues currently.  He taking aspirin, no longer taking his statin.    Also history of psoriasis, follows with dermatology.    ROS:see above  No eye complaints  No chest pain on exertion.   No dyspepsia, abdominal pain  No neurological complaints.      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    OBJECTIVE:  BP 131/81 (SITE: left arm, Orthostatic Position: sitting, Cuff Size: regular)   Pulse 98   Temp 36.4 C (97.6 F) (Tympanic)   Ht 1.88 m  (6\' 2" )   Wt 77.4 kg (170 lb 10.2 oz)   SpO2 98%   BMI 21.91 kg/m  Body mass index is 21.91 kg/m.  General Appearance: alert, no distress, pleasant affect  Eyes:  conjunctivae and corneas clear.  Neck:  Neck supple. No adenopathy  Heart:  normal rate and regular rhythm, no murmurs  Lungs: Few coarse breath sounds bilaterally, otherwise clear to auscultation.  Abdomen: BS normal.  Abdomen soft, non-tender.  Extremities:  no cyanosis or edema.    ASSESSMENT/PLAN:  [place] was seen today for establish care.    Diagnoses and all orders for this visit:    Essential hypertension -well-controlled, no medication changes recommended today.    Chronic bronchitis, unspecified chronic bronchitis type (HCC) -long discussion regarding his COPD.  Discussed the absolute importance of tobacco cessation.  Offered my help in any way possible.  He has upcoming follow-up with pulmonology.  It appears lung cancer screening has been offered in the past which he declined.  He is again not interested today.    Tobacco use    Popliteal artery aneurysm (HCC) -we reviewed his last vascular surgery note.  He was to continue both the aspirin and Lipitor, this was resent to his pharmacy today.  -     Atorvastatin (LIPITOR) 40 mg tablet; Take 1 tablet by mouth every day.  Need for vaccination for Strep pneumoniae  -     PNEUMOCOCCAL VACCINE,23-VALENT,ADULT    Encounter for immunization  -     PNEUMOCOCCAL VACCINE,23-VALENT,ADULT    Barriers to Learning: none  Patient/Family Understanding: verbalizes    Follow up 6 months or PRN    Electronically signed by  Alcario Droughthristopher R. Maple HudsonYoung, MD  [facility]     This note was created using the support of [employer]. Please note any grammatical, sound-alike, or syntax errors as likely dictation errors.

## 2018-08-27 NOTE — Nursing Note (Signed)
Patient ID confirmed using 2 identifiers.    Vital signs taken, allergies verified, screened for pain, and med history taken.      Mammography screening for females between 50-72 years of age reviewed as appropriate.    Flu Vaccine status reviewed and updated as appropriate.      My Chart status reviewed and updated as appropriate.    I have asked the patient if they have received any medical treatment/consultations outside of the [facility] within the past 30 days.  The patient has indicated no to receiving services outside of the [facility].  If yes, I have requested information to ascertain these results.     Saryah Loper, M.A.

## 2018-08-27 NOTE — Nursing Note (Signed)
Immunization VIS documentation(s) were given to patient  to review. All questions were answered and the patient consented to the Immunization(s) being given. Patient allergies were reviewed and no contraindications were found. The immunization(s) were given as ordered. The patient was observed for any immediate reactions to the vaccine. None were observed. ABN explained & signed by pt.   Evamae Rowen R Aubrianna Orchard, LVN

## 2018-08-28 ENCOUNTER — Ambulatory Visit: Payer: Medicare Other | Attending: Dermatology

## 2018-08-28 DIAGNOSIS — L408 Other psoriasis: Secondary | ICD-10-CM | POA: Insufficient documentation

## 2018-08-28 NOTE — Nursing Note (Signed)
Referral to Phototherapy by: Doctor: Rehal  Reaction to last TX : none  Patient tolerated treatment well.   Changes to medication: none   See Doc Flowsheet for phototherapy visit.  Kenneth Gordon sr.LVN

## 2018-08-31 ENCOUNTER — Ambulatory Visit: Payer: Self-pay

## 2018-09-02 ENCOUNTER — Ambulatory Visit: Payer: Medicare Other | Attending: Dermatology

## 2018-09-02 DIAGNOSIS — L408 Other psoriasis: Secondary | ICD-10-CM | POA: Insufficient documentation

## 2018-09-02 NOTE — Nursing Note (Signed)
Referral to Phototherapy by: Doctor:  rehal  Changes in medication: no  Changes in topical/lotions: none  Patient tolerated treatment well.    See Doc Flowsheet for phototherapy visit.  D.L. Kenneth Todisco Sr. LVN

## 2018-09-04 ENCOUNTER — Ambulatory Visit: Payer: Medicare Other | Attending: Dermatology

## 2018-09-04 DIAGNOSIS — L408 Other psoriasis: Secondary | ICD-10-CM | POA: Insufficient documentation

## 2018-09-04 NOTE — Nursing Note (Signed)
Referral to Phototherapy by: Doctor:  rehal  Changes in medication: no  Changes in topical/lotions: none  Patient tolerated treatment well.    See Doc Flowsheet for phototherapy visit.  D.L. Aleni Andrus Sr. LVN

## 2018-09-05 ENCOUNTER — Encounter: Payer: Self-pay | Admitting: Family Medicine

## 2018-09-05 DIAGNOSIS — J410 Simple chronic bronchitis: Secondary | ICD-10-CM

## 2018-09-07 ENCOUNTER — Ambulatory Visit: Payer: Medicare Other | Attending: Dermatology

## 2018-09-07 DIAGNOSIS — L408 Other psoriasis: Secondary | ICD-10-CM | POA: Insufficient documentation

## 2018-09-07 MED ORDER — ALBUTEROL SULFATE 1.25 MG/3 ML SOLUTION FOR NEBULIZATION
1.2500 mg | INHALATION_SOLUTION | Freq: Four times a day (QID) | RESPIRATORY_TRACT | 0 refills | Status: DC | PRN
Start: 2018-09-07 — End: 2018-09-08

## 2018-09-07 NOTE — Telephone Encounter (Signed)
Last RF 06/24/18  Last OV 08/27/18  Earney Mallet, RMA

## 2018-09-07 NOTE — Nursing Note (Signed)
Referral to Phototherapy by: Doctor: rehal  Reaction to last TX : none  Patient tolerated treatment well.   Changes to medication: none   See Doc Flowsheet for phototherapy visit.  Kenneth Gordon sr.LVN

## 2018-09-08 MED ORDER — ALBUTEROL SULFATE 1.25 MG/3 ML SOLUTION FOR NEBULIZATION
1.2500 mg | INHALATION_SOLUTION | Freq: Four times a day (QID) | RESPIRATORY_TRACT | 0 refills | Status: DC | PRN
Start: 2018-09-08 — End: 2018-11-13

## 2018-09-08 NOTE — Telephone Encounter (Signed)
This was not sent to pharmacy:    Albuterol (ACCUNEB) 1.25 mg/3 mL nebulizer solution 75 mL 0 09/07/2018 09/07/2019    Sig - Route: Take 3 mL by inhalation 4 times daily if needed. - INHALATION    Class: Back Office

## 2018-09-08 NOTE — Addendum Note (Signed)
Addended byAnnie Paras on: 09/08/2018 08:21 AM     Modules accepted: Orders

## 2018-09-09 ENCOUNTER — Ambulatory Visit: Payer: Medicare Other | Attending: Dermatology

## 2018-09-09 DIAGNOSIS — L408 Other psoriasis: Secondary | ICD-10-CM | POA: Insufficient documentation

## 2018-09-09 NOTE — Nursing Note (Signed)
Referral to Phototherapy by: Doctor: Rehal  Reaction to last TX : none  Patient tolerated treatment well.   Changes to medication: none   See Doc Flowsheet for phototherapy visit.  Telesforo Brosnahan Aldridge-Grant sr.LVN

## 2018-09-11 ENCOUNTER — Ambulatory Visit: Payer: Medicare Other | Attending: Dermatology

## 2018-09-11 DIAGNOSIS — L408 Other psoriasis: Secondary | ICD-10-CM | POA: Insufficient documentation

## 2018-09-11 NOTE — Progress Notes (Deleted)
Department of Internal Medicine  Division of Pulmonary, Sleep, and Critical Care Medicine  Patient Kenneth HamperRobert G Gordon                             MRN [id number]       Date of Service: 09/11/2018     Dear Dr. Derrek GuActon, Noreene Larssonhomas Paul,    We [facility] the [facility] today for follow up.  Please see below for our evaluation and recommendations for management.     ID: Kenneth MaduroRobert is a [age]yr old male patient who follows in pulmonary clinic for COPD    Interim Events: Since last pulmonary clinic visit on 04/01/18  - prednisone burst   - continues to smoke at least 1/4 pack a day. Not taking Chantix.     Subjective:  - CAT *** (Priors 18;19;18) (>10 poorly controlled; change in 2 MCID)  - mmRC *** (Prior 2; 2; 3) (>2 poorly controlled)  - Max exertion: garage to clinic, cant climb stairs   - Perception of breathing is the same as prior.     Exacerbations:   - Never been hospitalized for COPD exacerbation   - Recent prednisone burst  - Leaf blower makes it worse    Meds:   - Combivent use 4-5x/day , states he uses it as a "habit" . Albuterol nebs 2x/week   - Symbicort BID  - Spiriva Respimat daily     Still smokes 5 cigs/day (from 1 pack/day). Tried smoking cessation number.  Restarted Chantix.     COPD Assessment Test (CAT)   How often do you cough? Frequently Cough   Do you have phlegm (mucus) in your chest? Some Phlegm   Do you have chest tightness? More Tightness than Usual   Do you feel breathless walking uphill or up the stairs? Very Breathless   Are you limited doing any activities at home? Not Limited   Are you confident leaving home despite your lung condition? Mostly Confident   Are you able to sleep soundly despite your lung condition? Sometimes Wake Up   How is your energy level? Have Some Energy   Total Score: 18 (calculated)     Review of Systems:  Cough, dyspnea on exertion   Occasional GERD q3-4 mo. Better with carbonated water     All other systems reviewed during  the visit are negative, except as noted above.  The negative ROS include absence of other constitutional (fevers, chills, night sweats, unintentional weight loss), nasal ulcers, dry mouth, chest pain, dysphagia, musculoskeletal (joint pain, synovitis), skin including rashes and purpura, neurological including mononeuropathies (e. g. peripheral nerve pain affecting hands, arms, legs), and adverse reactions to prescribed drugs.    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 Active 1/2 pack per day    Medications/Allergies:  Medications and Allergies were reviewed with the patient      COPD Meds    Albuterol (ACCUNEB) 1.25 mg/3 mL nebulizer solution, Take 3 mL by inhalation every 4 hours if needed., Disp: 30 vial, Rfl: 3    Albuterol/Ipratropium (COMBIVENT RESPIMAT) 20-100 mcg/actuation Inhaler, Take 1 puff by inhalation 4 times daily., Disp: 3 inhaler, Rfl: 3    Budesonide/Formoterol (SYMBICORT) 160-4.5 mcg/actuation Inhaler, Take 2 puffs by inhalation 2 times daily., Disp: 1 inhaler, Rfl: 11    Tiotropium (SPIRIVA RESPIMAT) 2.5 mcg/actuation Inhaler, Take 2 sprays by inhalation every day., Disp: 4 g, Rfl: 11    Other Outpatient Medications:     Aspirin 81 mg  Chewable Tablet, Take 81 mg by mouth every day., Disp: , Rfl:     Atorvastatin (LIPITOR) 40 mg tablet, Take 1 tablet by mouth every day., Disp: 90 tablet, Rfl: 3    Azelastine Nasal (ASTELIN) 137 mcg (0.1 %) Spray, Instill 2 sprays into EACH nostril 2 times daily., Disp: 1 bottle, Rfl: 0    Fluticasone (FLONASE) 50 mcg/actuation nasal spray, Instill 1-2 sprays into EACH nostril every day. (allergies), Disp: 16 g, Rfl: 5    Lisinopril (PRINIVIL, ZESTRIL) 20 mg Tablet, TAKE 1 TABLET BY MOUTH EVERY DAY FOR BLOOD PRESSURE, Disp: 90 tablet, Rfl: 0    PredniSONE (DELTASONE) 20 mg Tablet, Take 2 tablets by mouth every day. take 2 tablets by mouth once daily for 5 days, Disp: 10 tablet, Rfl: 0    Varenicline (CHANTIX CONTINUING MONTH BOX) 1 mg Tablet, Take 1 tablet by mouth 2 times daily., Disp: 56 tablet, Rfl: 3    Varenicline (CHANTIX STARTING MONTH BOX) 0.5 mg (11)- 1 mg (42) tablet, Take 0.5 mg tablet daily for 3 days, then take 0.5 mg tablet twice daily for 4 days, then take 1 mg tablet twice daily., Disp: 53 tablet, Rfl: 3     PHYSICAL EXAM:  Vital Signs: There were no vitals taken for this visit.   Constitutional: The patient is  well-developed. No acute distress.   Ear, Nose, Mouth & Throat:  Oropharynx is benign. No oral thrush.  No palpable lymphadenopathy. Sinuses non-tender. Mucus in nasal passages.   Respiratory:  Normal respiratory effort and work of breathing. No wheezing noted. Hyperresonant. Chest wall enlargement.   Cardiovascular:  Regular rate and regular rhythm. No murmur, rub, or gallop.   Skin: Psoriasis plaques on elbows  Musculoskeletal: No peripheral edema.  Neuro: No focal neurological findings.    LAB TESTS  Labs notable for:  Hematology:  Lab Results   Component Value Date    WBC 7.3 02/18/2018    HGB 15.0 02/18/2018    AUTOEOS 2.6 02/18/2018    AUTOEOSABS 0.2 02/18/2018    IGE 75.8 (H) 10/16/2017     Chemistry:  Lab Results   Lab Name Value Date/Time    CO2 30 02/18/2018 09:46 AM    CO2  30 12/30/2014 07:14 AM    BUN 14 02/18/2018 09:46 AM    BUN 17 12/30/2014 07:14 AM    CR 1.00 02/18/2018 09:46 AM    CR 0.97 12/30/2014 07:14 AM     Coagulation:  Lab Results   Component Value Date    PLT 201 02/18/2018  INR 1.75 (H) 10/16/2017    APTT 26.5 11/01/2016     a1AT - 186 mg/dL  Phenotype M2M2  CRP 0.5  Fibrinogen Degradation Product <10   RAST Panel- negative   FENO- 20     IMAGING/STUDIES    CXR 04/02/17   - Pulmonary hyperinflation c/w COPD  - No nodules by CXR    TTE 08/04/17  1. The LV systolic function is normal. The estimated LV ejection fraction is 60 %.  2. Trivial pericardial effusion.  3. Decreased left ventricular internal cavity size.  4. Mild aortic valve sclerosis without stenosis.  5. Mild concentric left ventricular hypertrophy.  6. The left ventricular diastolic function is abnormal. Stage I: Impaired early left ventricular relaxation (Abnormal relaxation pattern).  7. The left atrium is normal in size and structure.  8. The right atrium is normal in size and structure.    PFT 10/06/17    Very severe COPD is confirmed by reduced expiratory flow rates, volumes, and the flow volume loop. Severe lung hyperinflation. DLco is severely reduced probably because of pulmonary emphysema but anemia, ILD, pulmonary vascular and drug-induced lung disease should remain a concern.     ASSESSMENT AND PLAN  Tristin is a 73yr old male here for:    # COPD  Severe Obstruction.   GOLD Class: D   Phenotype: Emphysematous  Therapy:  - Bronchodilator therapy:    Rescue- Combivent MDI q4h PRN, albuterol nebs    Controller- Symbicort (ICS/LABA), Spiriva Respimat (LAMA)   Exacerbation prevention- Patient not a candidate for chronic azithromycin or Roflumilast at this time as no frequent exacerbations    In patients with severe air trapping or diminished PIF, nebulized bronchodilators and inhaled corticosteroids can offer superior drug delivery. Patient wanting to defer at this time  - Pulmonary Rehab offered to  patient and he accepted (current smoker, so may not be accepted by program)  - Patient not a candidate for lung reduction surgery given ongoing smoking   - Recommend early palliative care involvement     Evaluation:  - Full PFTs with BD response yearly  - Oxygen qualifying test recommended again to patient  - Annual low dose Chest CT for lung cancer screening recommended and discussed with patient    # Tobacco  Dependence   - I counseled the patient on smoking cessation and provided  resources to assist the patient on this goal  - Nicotine lozenges, patches, gum tried without success  - Recommend he resume Varencicline (Chantix)     # Nasal Allergies- currently controlled  - Flonase, Astelin sprays, nasal rinses as needed     # GERD  - Famotidine 40mg  daily and PRN    # Health Maintenance  - Recommend Influenza, Prevnar, Pneumovax, Tdap vaccinations per CDC guidelines    EDUCATION:  I educated/instructed the patient or caregiver regarding all aspects of the above stated plan of care.  The patient or caregiver indicated understanding.     We will plan to see Grier Mitts in clinic in *** months for follow-up.  Please do not hesitate to contact us with any additional questions or concerns.    Patient was seen and discussed with Dr. Marland Kitchen     Sincerely,  Electronically signed by:  Alexia Freestone, MD  Fellow, Division of Pulmonary and Critical Care Medicine  Department of Internal Medicine  Pager [phone]  [facility] # [id number]      [address], [address]  [place], [place]  Phone: [phone]  Fax: [phone]

## 2018-09-11 NOTE — Nursing Note (Signed)
Referral to Phototherapy by: Doctor: Rehal  Reaction to last TX : none  Patient tolerated treatment well.   Changes to medication: none   See Doc Flowsheet for phototherapy visit.  Azarel Banner Aldridge-Grant sr.LVN

## 2018-09-14 ENCOUNTER — Ambulatory Visit: Payer: Self-pay

## 2018-09-16 ENCOUNTER — Ambulatory Visit: Payer: Medicare Other | Admitting: PULMONARY DISEASE

## 2018-09-16 ENCOUNTER — Encounter: Payer: Self-pay | Admitting: Family Medicine

## 2018-09-16 ENCOUNTER — Ambulatory Visit: Payer: Medicare Other | Attending: Dermatology

## 2018-09-16 DIAGNOSIS — L408 Other psoriasis: Secondary | ICD-10-CM | POA: Insufficient documentation

## 2018-09-16 NOTE — Telephone Encounter (Signed)
From: Kenneth Gordon  To: Kenneth Gordon. Annamaria Boots, MD  Sent: 09/16/2018 2:57 PM PDT  Subject: Non-urgent Medical Advice Question    Hi Dr. Annamaria Boots     I am unable to wear a cloth or paper mask and was wondering if you had a solution.     When wearing the mask I feel like I am not getting enough air and with a little walking ( a couple of steps) I need to remove the mask and catch my breath.    Thanks in advance.  Kenneth Gordon

## 2018-09-16 NOTE — Nursing Note (Signed)
Referral to Phototherapy by: Doctor:  rehal  Changes in medication: no  Changes in topical/lotions: none  Patient tolerated treatment well.    See Doc Flowsheet for phototherapy visit.  D.L. [name] LVN

## 2018-09-16 NOTE — Telephone Encounter (Signed)
CONSULT MD: Hold for MD

## 2018-09-18 ENCOUNTER — Ambulatory Visit: Payer: Medicare Other | Attending: Dermatology

## 2018-09-18 DIAGNOSIS — L408 Other psoriasis: Secondary | ICD-10-CM | POA: Insufficient documentation

## 2018-09-18 NOTE — Nursing Note (Signed)
Referral to Phototherapy by: Doctor:  rehal  Changes in medication: no  Changes in topical/lotions: none  Patient tolerated treatment well.    See Doc Flowsheet for phototherapy visit.  D.L. Enrica Corliss Sr. LVN

## 2018-09-21 ENCOUNTER — Other Ambulatory Visit: Payer: Self-pay | Admitting: Internal Medicine

## 2018-09-21 ENCOUNTER — Ambulatory Visit: Payer: Medicare Other | Attending: Dermatology

## 2018-09-21 DIAGNOSIS — J410 Simple chronic bronchitis: Secondary | ICD-10-CM

## 2018-09-21 DIAGNOSIS — L408 Other psoriasis: Secondary | ICD-10-CM | POA: Insufficient documentation

## 2018-09-21 NOTE — Nursing Note (Signed)
Referral to Phototherapy by: Doctor: Rehal  Reaction to last TX : none  Patient tolerated treatment well.   Changes to medication: none   See Doc Flowsheet for phototherapy visit.  Kenneth Gordon sr.LVN

## 2018-09-21 NOTE — Telephone Encounter (Signed)
[place] request  Last fill date 09/04/2018  Last office Visit 07/15/17    Webb Laws, LVN

## 2018-09-23 ENCOUNTER — Ambulatory Visit: Payer: Medicare Other | Attending: Dermatology

## 2018-09-23 DIAGNOSIS — L408 Other psoriasis: Secondary | ICD-10-CM | POA: Insufficient documentation

## 2018-09-28 ENCOUNTER — Ambulatory Visit: Payer: Medicare Other | Attending: Dermatology

## 2018-09-28 DIAGNOSIS — L408 Other psoriasis: Secondary | ICD-10-CM | POA: Insufficient documentation

## 2018-09-28 NOTE — Nursing Note (Signed)
Referral to Phototherapy by: Doctor: Dr.Rehal  Reaction to last TX : none  Patient tolerated treatment well.   Changes to medication: none   See Doc Flowsheet for phototherapy visit.      Cavin Longman, Sr.LVN

## 2018-09-30 ENCOUNTER — Ambulatory Visit: Payer: Self-pay

## 2018-10-02 ENCOUNTER — Ambulatory Visit: Payer: Medicare Other | Attending: Dermatology

## 2018-10-02 DIAGNOSIS — L408 Other psoriasis: Secondary | ICD-10-CM | POA: Insufficient documentation

## 2018-10-02 NOTE — Nursing Note (Signed)
Referral to Phototherapy by: Doctor: Dr.Rehal  Reaction to last TX : none  Patient tolerated treatment well.   Changes to medication: none   See Doc Flowsheet for phototherapy visit.      Kenneth Gordon, Sr.LVN

## 2018-10-05 ENCOUNTER — Ambulatory Visit: Payer: Medicare Other | Attending: Dermatology

## 2018-10-05 DIAGNOSIS — L408 Other psoriasis: Secondary | ICD-10-CM | POA: Insufficient documentation

## 2018-10-05 NOTE — Nursing Note (Signed)
Referral to Phototherapy by: Doctor: Rehal  Reaction to last TX : none  Patient tolerated treatment well.   Changes to medication:none   See Doc Flowsheet for phototherapy visit.  Aaidyn San, LVN

## 2018-10-06 NOTE — Nursing Note (Signed)
Referral to Phototherapy by: Doctor: Rehal  Reaction to last TX : none  Patient tolerated treatment well.   Changes to medication: none   See Doc Flowsheet for phototherapy visit.  Eshika Reckart Aldridge-Grant sr.LVN

## 2018-10-07 ENCOUNTER — Ambulatory Visit: Payer: Medicare Other | Attending: Dermatology

## 2018-10-07 DIAGNOSIS — L408 Other psoriasis: Secondary | ICD-10-CM | POA: Insufficient documentation

## 2018-10-07 NOTE — Nursing Note (Signed)
Referral to Phototherapy by: Doctor: Rehal  Reaction to last TX : none  Patient tolerated treatment well.   Changes to medication: none   See Doc Flowsheet for phototherapy visit.  Kenneth Gordon sr.LVN

## 2018-10-09 ENCOUNTER — Ambulatory Visit: Payer: Self-pay

## 2018-10-12 ENCOUNTER — Ambulatory Visit: Payer: Self-pay

## 2018-10-14 ENCOUNTER — Ambulatory Visit: Payer: Medicare Other | Attending: Dermatology

## 2018-10-14 DIAGNOSIS — L408 Other psoriasis: Secondary | ICD-10-CM | POA: Insufficient documentation

## 2018-10-14 NOTE — Nursing Note (Signed)
Referral to Phototherapy by: Doctor:  rehal  Changes in medication: no  Changes in topical/lotions: none  Patient tolerated treatment well.    See Doc Flowsheet for phototherapy visit.  D.L. Milinda Sweeney Sr. LVN

## 2018-10-16 ENCOUNTER — Ambulatory Visit: Payer: Medicare Other | Attending: Dermatology

## 2018-10-16 DIAGNOSIS — L408 Other psoriasis: Secondary | ICD-10-CM | POA: Insufficient documentation

## 2018-10-16 NOTE — Nursing Note (Signed)
Referral to Phototherapy by: Doctor:  rehal  Changes in medication: no  Changes in topical/lotions: none  Patient tolerated treatment well.    See Doc Flowsheet for phototherapy visit.  D.L. Etrulia Zarr Sr. LVN

## 2018-10-19 ENCOUNTER — Ambulatory Visit: Payer: Self-pay

## 2018-10-21 ENCOUNTER — Ambulatory Visit: Payer: Medicare Other | Admitting: PULMONARY DISEASE

## 2018-10-21 ENCOUNTER — Ambulatory Visit: Payer: Medicare Other | Attending: Dermatology

## 2018-10-21 DIAGNOSIS — L408 Other psoriasis: Secondary | ICD-10-CM | POA: Insufficient documentation

## 2018-10-21 NOTE — Nursing Note (Signed)
Referral to Phototherapy by: Doctor: Rehal  Reaction to last TX : none  Patient tolerated treatment well.   Changes to medication: none   See Doc Flowsheet for phototherapy visit.  Kenneth Gordon sr.LVN

## 2018-10-22 ENCOUNTER — Ambulatory Visit: Payer: Medicare Other | Admitting: Dermatology

## 2018-10-22 NOTE — Progress Notes (Deleted)
10/22/2018  Last seen 02/13/2018    I had the pleasure of seeing [name], a [age]yr male in follow-up.    The patient's chief concern is psoriasis.     The HPI is noted below in the integrated section HPI/Exam/Assessment/Plan      Past Medical History:   History of skin cancer: None    Family History:                                             Skin cancer:None    Social History:  The patient does smoke but does not use tobacco products.    Allergies:  No Known Allergies    Review of Systems: No fevers, chills, weight loss, headache or cough.     Exam:  There were no vitals taken for this visit.  On physical examination, the patient is well developed, well nourished, and pleasant with normal affect. The patient is alert to time, place, and person.    The following areas were inspected***  Scalp with palpation, head and face, eyelids and conjunctiva, teeth, gums, tongue, lips, neck, chest (including the breasts and axillae), abdomen, back, buttocks/groin, right and left upper extremities and right and left lower extremities. The digits and nails were inspected and palpated. The lower extremities were inspected for varicosities and palpated for peripheral swelling.    Lymph node basins were palpated in the neck    The findings were normal except for the following pertinent abnormal findings listed in the integrated HPI/Exam/Assessment/Plan section below    Labs/Imaging reviewed today:   none         HPI/Exam/Assessment/Plan:    # Plaque Psoriasis:   HX: Patient reports hx of psoriasis with pink scaly lesions on extremities and back for many years. Associated with mild intermittent itching and occasional discomfort. Patient has used clobetasol with mild improvement. Denies joint pain.  Phototherapy 3x a week, started 02/26/2018, s/p 37 treatments      PE: Multiple well demarcated erythematous round papules and plaques with thick overlying white scale on the forearms, palms, dorsal hands, thighs, knees, legs,  dorsal feet, lower back, posterior thighs, buttocks. Total body surface area 20%.   AP: Moderate-Severe predominantly plaque-type psoriasis.  - Given extent of BSA, symptoms and patient distress recommend additional systemic treatment for improved control. Treatment options including phototherapy, MTX, Soriatane, Otezla as well as injectable biologics discussed in detail. Patient denies history of heart failure, lymphoma, serious/active infection, neurological disorders, liver disease or hepatitis B infection. Pt would like to proceed with soriatane.    - Will obtain baseline CBC, CMP, FLP in preparation to start Soriatane. If labs within normal limits will plan to start with 25 mg PO every day. Patient reminded to take with fatty meal for improved absorption. Discussed possibility of combining treatment with NBUVB which may lead to increased improvement. Pt would like to proceed.  - NBUVB 3 treatments per week to full body. Long term SE including tanning and increased risk of accelerated photoaging and skin cancer discussed.  - Continue Clobetasol ointment 0.05% twice daily. Do not use more than 45 grams of Clobetasol weekly. Continue as needed. Discussed risks of long term use of topical steroids including thinning of the skin, striae, telangiectasias, increased susceptibility to skin infections, allergy to the medication.  - RTc in 3 months      Medication  Review: We discussed risks, benefits, and proper use of dermatological medications with the patient who verbalized understanding.   Education Needs: Education needs were identified. There were no barriers to understanding, and the patient's questions and concerns were addressed.   Handouts Given and Explained: None        The patient was asked to follow up in the clinic in 3 months.     Education needs were addressed and there were no barriers to learning.     Bronson Ing, MD  Associate Physician  Department of Dermatology  [facility] [employer]    ---------------------------------------------------------------------------------------------------------------------------------  ***

## 2018-10-23 ENCOUNTER — Ambulatory Visit: Payer: Medicare Other | Attending: Dermatology

## 2018-10-23 DIAGNOSIS — L408 Other psoriasis: Secondary | ICD-10-CM | POA: Insufficient documentation

## 2018-10-23 NOTE — Nursing Note (Signed)
Referral to Phototherapy by: Doctor: Rehal  Reaction to last TX : none  Patient tolerated treatment well.   Changes to medication: none   See Doc Flowsheet for phototherapy visit.  Kenneth Gordon sr.LVN

## 2018-10-26 ENCOUNTER — Ambulatory Visit: Payer: Medicare Other | Attending: Dermatology

## 2018-10-26 DIAGNOSIS — L408 Other psoriasis: Secondary | ICD-10-CM | POA: Insufficient documentation

## 2018-10-26 NOTE — Nursing Note (Signed)
Referral to Phototherapy by: Doctor:rehal  Reaction to last TX :no  Patient tolerated treatment well.   Changes to medication:no   See Doc Flowsheet for phototherapy visit.  Jalina Blowers, LVN  Dermatology Department

## 2018-10-28 ENCOUNTER — Ambulatory Visit: Payer: Medicare Other | Attending: Dermatology

## 2018-10-28 DIAGNOSIS — L408 Other psoriasis: Secondary | ICD-10-CM | POA: Insufficient documentation

## 2018-10-28 NOTE — Nursing Note (Signed)
Referral to Phototherapy by: Doctor:rehal  Reaction to last TX :no  Patient tolerated treatment well.   Changes to medication:no   See Doc Flowsheet for phototherapy visit.  Lesta Limbert, LVN  Dermatology Department

## 2018-10-30 ENCOUNTER — Ambulatory Visit: Payer: Medicare Other | Attending: Dermatology

## 2018-10-30 DIAGNOSIS — L408 Other psoriasis: Secondary | ICD-10-CM | POA: Insufficient documentation

## 2018-10-30 NOTE — Nursing Note (Signed)
Referral to Phototherapy by: Doctor: Rehal  Reaction to last TX : none  Patient tolerated treatment well.   Changes to medication:none   See Doc Flowsheet for phototherapy visit.  Suprina Mandeville, LVN

## 2018-11-02 ENCOUNTER — Ambulatory Visit: Payer: Self-pay

## 2018-11-04 ENCOUNTER — Ambulatory Visit: Payer: Medicare Other | Attending: Dermatology

## 2018-11-04 DIAGNOSIS — L408 Other psoriasis: Secondary | ICD-10-CM | POA: Insufficient documentation

## 2018-11-04 NOTE — Nursing Note (Signed)
Referral to Phototherapy by: Doctor: Dr.Rehal  Reaction to last TX : none  Patient tolerated treatment well.   Changes to medication: none   See Doc Flowsheet for phototherapy visit.      Voris Tigert, Sr.LVN

## 2018-11-06 ENCOUNTER — Ambulatory Visit: Payer: Medicare Other | Attending: Dermatology

## 2018-11-06 DIAGNOSIS — L408 Other psoriasis: Secondary | ICD-10-CM | POA: Insufficient documentation

## 2018-11-06 NOTE — Nursing Note (Signed)
Referral to Phototherapy by: Doctor: Rehal  Reaction to last TX : none  Patient tolerated treatment well.   Changes to medication:none   See Doc Flowsheet for phototherapy visit.  Kenneth Gordon, LVN

## 2018-11-09 ENCOUNTER — Ambulatory Visit: Payer: Self-pay

## 2018-11-10 ENCOUNTER — Other Ambulatory Visit: Payer: Self-pay | Admitting: Family Medicine

## 2018-11-10 NOTE — Telephone Encounter (Signed)
Last refill: 08/10/2018    Last seen 08/27/18

## 2018-11-11 ENCOUNTER — Ambulatory Visit: Payer: Self-pay

## 2018-11-13 ENCOUNTER — Ambulatory Visit: Payer: Medicare Other

## 2018-11-13 ENCOUNTER — Encounter: Payer: Self-pay | Admitting: Family Medicine

## 2018-11-13 DIAGNOSIS — J410 Simple chronic bronchitis: Secondary | ICD-10-CM

## 2018-11-13 MED ORDER — ALBUTEROL SULFATE 1.25 MG/3 ML SOLUTION FOR NEBULIZATION
1.2500 mg | INHALATION_SOLUTION | Freq: Four times a day (QID) | RESPIRATORY_TRACT | 5 refills | Status: DC | PRN
Start: 2018-11-13 — End: 2020-06-04

## 2018-11-16 ENCOUNTER — Ambulatory Visit: Payer: Medicare Other

## 2018-11-18 ENCOUNTER — Ambulatory Visit: Payer: Self-pay

## 2018-11-20 ENCOUNTER — Ambulatory Visit: Payer: Self-pay

## 2018-11-23 ENCOUNTER — Ambulatory Visit: Payer: Self-pay

## 2018-11-25 ENCOUNTER — Ambulatory Visit: Payer: Self-pay

## 2018-11-27 ENCOUNTER — Ambulatory Visit: Payer: Self-pay

## 2018-12-02 ENCOUNTER — Ambulatory Visit: Payer: Medicare Other

## 2018-12-04 ENCOUNTER — Ambulatory Visit: Payer: Self-pay

## 2018-12-07 ENCOUNTER — Ambulatory Visit: Payer: Self-pay

## 2018-12-09 ENCOUNTER — Ambulatory Visit: Payer: Self-pay

## 2018-12-11 ENCOUNTER — Ambulatory Visit: Payer: Self-pay

## 2018-12-28 ENCOUNTER — Encounter: Payer: Self-pay | Admitting: Family Medicine

## 2018-12-28 DIAGNOSIS — J410 Simple chronic bronchitis: Secondary | ICD-10-CM

## 2018-12-28 DIAGNOSIS — J45909 Unspecified asthma, uncomplicated: Secondary | ICD-10-CM

## 2018-12-28 DIAGNOSIS — J449 Chronic obstructive pulmonary disease, unspecified: Secondary | ICD-10-CM

## 2018-12-28 NOTE — Telephone Encounter (Signed)
From: Grier Mitts  To: Lisette Grinder. Annamaria Boots, MD  Sent: 12/28/2018 10:32 AM PDT  Subject: Non-urgent Medical Advice Question    Good Morning    Well, I have two questions this morning.    Have I had my flu shot this year. i thought that I had it on my last visit, but I don't see any mention of it in my records. If I haven't had the shot how and when can I get it.    Due to this heavy smoke for the last month I have been using more than prescribed Symbicort and Combivent. Now I have run out Symbicort and my prescription isn't due to be refilled until 9/18. How can I get that refilled early.     Thanks in Advance.

## 2018-12-29 MED ORDER — BUDESONIDE-FORMOTEROL HFA 160 MCG-4.5 MCG/ACTUATION AEROSOL INHALER
2.0000 | INHALATION_SPRAY | Freq: Two times a day (BID) | RESPIRATORY_TRACT | 11 refills | Status: DC
Start: 2018-12-29 — End: 2019-04-08

## 2019-03-02 ENCOUNTER — Ambulatory Visit: Payer: Medicare Other | Admitting: Family Medicine

## 2019-03-04 ENCOUNTER — Telehealth: Payer: Self-pay | Admitting: PULMONARY DISEASE

## 2019-03-04 ENCOUNTER — Encounter: Payer: Self-pay | Admitting: Family Medicine

## 2019-03-04 DIAGNOSIS — J441 Chronic obstructive pulmonary disease with (acute) exacerbation: Secondary | ICD-10-CM

## 2019-03-04 NOTE — Telephone Encounter (Signed)
From: Kenneth Gordon  To: Lisette Grinder. Annamaria Boots, MD  Sent: 03/04/2019 9:40 AM PST  Subject: MyChart Refill Request    Hi Dr. Annamaria Boots    Well, I have gotten to the point that I need to refill the :DELTASONE (PredniSONE) prescription for 5 day regimen . It has been a while since I have felt this bad and the :DELTASONE (PredniSONE) has seemed to do the trick in past. I have been sleeping in 2-3 hour segements, not having a shower in over a week, not being able to walk any great distance with having to stop and rest and having to sit down after the simplest task. Wearing a mask is a problem as I have to remove it to catch my breath. Now that I have a oximeter my readings when I get up in the morning is 87-88 and after I have been up for a while it is 93-95.

## 2019-03-04 NOTE — Telephone Encounter (Signed)
3 patient identifiers used  Reviewed message from MD with patient.   As no appointments available today in clinic, offered to authorize urgent care or advised to call pulmonologist to see if able to refill or get appointment there. Agreed to contact pulmonologist, Dr. Suann Larry. Phone number provided and transferred to Dr. Suann Larry' office now.     Expressed importance in getting evaluated today so advised to call back if unable to get recommendations or appointment from pulmonologist.     Questions were answered and no further questions or concerns at this time. Advised to please callback to the advice line at any time if questions arise.    Deatra Robinson., RN  PCN Triage

## 2019-03-04 NOTE — Telephone Encounter (Signed)
I agree with appointment. Per note by Dr Annamaria Boots on 07/26/2018 for same issue:    "I sent in the prednisone, but needs to be seen for this in future either by [place] or pulmonology. "    Patient can contact his pulmonologist to see if they will send in refill, I recommend appointment    Tomie China, MD

## 2019-03-04 NOTE — Telephone Encounter (Signed)
3 patient identifiers used.  Per:   patient.        CONSULT MD:   no appointments avail; patient declined urgent care; he is requesting a 5 day course of prednisone; please  Advise         Per:   patient verbalizes agreement to plan. Agrees to callback with any increase in symptoms/concerns or questions.    See Assessment Below  ClearTriage Note: patient requesting a refill of prednisone; feels fatigue; feels sob with exertion; no chest  Pain ; uses inhaler Symbicort - helps  ;  87-88 oxygen   Saturation in the morning  Now it  Is 93-95 % ; states symptoms have been going  X 1 week; mild cough; no fever ; no known exposure to co vid       Protocol Used: Breathing Difficulty (Adult)  Protocol-Based Disposition: See HCP within 4 Hours (or PCP Triage by Phone or Video)    Video visit offer not recorded    Positive Triage Question:  * [1] MILD difficulty breathing (e.g., minimal/no SOB at rest, SOB with walking, pulse <100) AND [2] NEW-onset or WORSE than normal    Negative Triage Questions:  * [1] Breathing stopped AND [2] hasn't returned  * Choking on something  * SEVERE difficulty breathing (e.g., struggling for each breath, speaks in single words)  * Bluish (or gray) lips or face now  * Difficult to awaken or acting confused (e.g., disoriented, slurred speech)  * Passed out (i.e., lost consciousness, collapsed and was not responding)  * Wheezing started suddenly after medicine, an allergic food or bee sting  * Stridor  * Slow, shallow and weak breathing  * Sounds like a life-threatening emergency to the triager  * [1] MODERATE difficulty breathing (e.g., speaks in phrases, SOB even at rest, pulse 100-120) AND [2] NEW-onset or WORSE than normal  * Wheezing can be heard across the room  * Drooling or spitting out saliva (because can't swallow)  * History of prior "blood clot" in leg or lungs (i.e., deep vein thrombosis, pulmonary embolism)  * History of inherited increased risk of blood clots (e.g., Factor 5 Leiden,  Anti-thrombin 3, Protein C or Protein S deficiency, Prothrombin mutation)  * Major surgery in the past month  * Hip or leg fracture (broken bone) in past month (or had cast on leg or ankle in past month)  * Illness requiring prolonged bedrest in past month (e.g., immobilization, long hospital stay)  * Long-distance travel in past month (e.g., car, bus, train, plane; with trip lasting 6 or more hours)  * Extra heart beats OR irregular heart beating   (i.e., "palpitations")  * Fever > 103 F (39.4 C)  * [1] Fever > 101 F (38.3 C) AND [2] age > 74  * [1] Fever > 100.0 F (37.8 C) AND [2] bedridden (e.g., nursing home patient, CVA, chronic illness, recovering from surgery)  * [1] Fever > 100.0 F (37.8 C) AND [2] diabetes mellitus or weak immune system (e.g., HIV positive, cancer chemo, splenectomy, organ transplant, chronic steroids)  * [1] Periods where breathing stops and then resumes normally AND [2] bedridden (e.g., nursing home patient, CVA)  * Pregnant or postpartum (< 1 month since delivery)  * Patient sounds very sick or weak to the triager    Audrina Marten, RN  PCN triage

## 2019-03-04 NOTE — Telephone Encounter (Signed)
Refill Request     Patient is requesting refills for PredniSONE (DELTASONE) 20 mg Tablet (Pt is requesting a 5 day supply)     Patient has 0 days remaining.    Pharmacy:    [id number] [address], [address] [place], [phone] [facility] [phone] [place]  [place] [address]  Phone: [phone] Fax: [phone]    Telephone number: [facility]  Ph: [phone]   Fax # [phone]

## 2019-03-04 NOTE — Telephone Encounter (Signed)
Spoke with pt, confirmed identification using three pt identifiers (Name, DOB, Address).  Pt noted that symptom onset started approximate one week ago.  Pt noted an increase in SOB and lethargy.  SOB resolved with periods of rest and use of rescue inhaler (short duration of effect).  Pt concerned d/t inability to complete ADL's d/t fatigue and continued SOB.  Pt denied increase in cough, change in sputum color or presence of fever/chills/myalgia.  Pt requesting prednisone burst as it has been effective in treating symptoms in the past.  Reviewed ED precautions with pt.  Advised that request would be forwarded to Dr. Suann Larry to review and once recommendation was received that we would return call to discuss further.  Pt verbalized understanding, agreed with plan and displayed no barriers to learning. No further questions/concerns at this time.    Dr. Suann Larry -- see pt assessment below:    Wheezing:    Yes  Cough:    None   How long:   One week   Sputum (color/amount): No change noted   Chest congestion:  None  SOB:     Yes  Fever/chills:    None  Pain:     None  Sinus symptoms (post nasal drip/sore throat):  Post nasal drip  Edema:     None  Sleep (raise HOB?):   Nocturnal wakening 1-2 x / day  Sick contacts/exposures:  None    CURRENT MEDICATIONS:  Antibiotics   None  Prednisone   None  Inhalers (rescue/maintenance)  6-8 times / day; maintenance as Rx'd  Oxygen     None    LOCAL PHARMACY CONFIRMED:  [id number] [address], [place] - [address] AT [place], [phone] [facility]    Pt requesting Rx for prednisone burst be sent to address current symptoms.  Pt advised that you are on rotation for ICU coverage, however, pt requesting MyChart message be sent if Rx is sent this evening.    Thank you,    Livia Snellen BSN, RN  Clinical Nurse [facility]  [phone]

## 2019-03-05 MED ORDER — AZITHROMYCIN 500 MG TABLET
500.0000 mg | ORAL_TABLET | Freq: Every day | ORAL | 0 refills | Status: AC
Start: 2019-03-05 — End: 2019-03-10

## 2019-03-05 MED ORDER — PREDNISONE 20 MG TABLET
40.0000 mg | ORAL_TABLET | Freq: Every day | ORAL | 0 refills | Status: AC
Start: 2019-03-05 — End: 2019-03-10

## 2019-03-05 NOTE — Addendum Note (Signed)
Addended by: Alexia Freestone on: 03/05/2019 03:37 AM     Modules accepted: Orders

## 2019-03-05 NOTE — Telephone Encounter (Addendum)
See RNs Livia Snellen) documentation 03/04/19- Symptoms concerning for a COPD exacerbation.   Ordered prednisone 40mg  x 5d. If SOB at rest, fevers, worsening cough, recommend ED eval and COVID testing.     Alexia Freestone, MD  Fellow, Division of Pulmonary and Critical Care Medicine  Department of Internal Medicine

## 2019-03-05 NOTE — Telephone Encounter (Signed)
Spoke with pt, confirmed identification using three pt identifiers (Name, DOB, Address).  Relayed message from provider to pt.  Pt verbalized understanding and was agreeable with plan.  Pt denied having any additional questions at this time.    Livia Snellen BSN, RN  Clinical Nurse [facility]  [phone]

## 2019-04-08 ENCOUNTER — Ambulatory Visit (INDEPENDENT_AMBULATORY_CARE_PROVIDER_SITE_OTHER): Payer: Medicare Other | Admitting: Family Medicine

## 2019-04-08 ENCOUNTER — Encounter: Payer: Self-pay | Admitting: Family Medicine

## 2019-04-08 ENCOUNTER — Ambulatory Visit
Admission: RE | Admit: 2019-04-08 | Discharge: 2019-04-08 | Disposition: A | Discharge status: Home / Self Care | Payer: Medicare Other | Source: Ambulatory Visit | Attending: Family Medicine | Admitting: Family Medicine

## 2019-04-08 ENCOUNTER — Ambulatory Visit (INDEPENDENT_AMBULATORY_CARE_PROVIDER_SITE_OTHER): Payer: Medicare Other

## 2019-04-08 VITALS — BP 135/86 | HR 98 | Temp 97.9°F | Ht 74.0 in | Wt 146.6 lb

## 2019-04-08 DIAGNOSIS — Z23 Encounter for immunization: Secondary | ICD-10-CM

## 2019-04-08 DIAGNOSIS — I10 Essential (primary) hypertension: Secondary | ICD-10-CM

## 2019-04-08 DIAGNOSIS — J449 Chronic obstructive pulmonary disease, unspecified: Secondary | ICD-10-CM

## 2019-04-08 DIAGNOSIS — R634 Abnormal weight loss: Secondary | ICD-10-CM | POA: Insufficient documentation

## 2019-04-08 DIAGNOSIS — Z1211 Encounter for screening for malignant neoplasm of colon: Secondary | ICD-10-CM

## 2019-04-08 DIAGNOSIS — I724 Aneurysm of artery of lower extremity: Secondary | ICD-10-CM

## 2019-04-08 DIAGNOSIS — Z681 Body mass index (BMI) 19 or less, adult: Secondary | ICD-10-CM

## 2019-04-08 DIAGNOSIS — J42 Unspecified chronic bronchitis: Secondary | ICD-10-CM

## 2019-04-08 LAB — COMPREHENSIVE METABOLIC PANEL
Alanine Transferase (ALT): 21 U/L (ref 6–63)
Albumin: 3.9 g/dL (ref 3.2–4.6)
Alkaline Phosphatase (ALP): 64 U/L (ref 35–115)
Aspartate Transaminase (AST): 23 U/L (ref 15–43)
Bilirubin Total: 1 mg/dL (ref 0.3–1.3)
Calcium: 9.6 mg/dL (ref 8.6–10.5)
Carbon Dioxide Total: 31 mmol/L (ref 24–32)
Chloride: 93 mmol/L — ABNORMAL LOW (ref 95–110)
Creatinine Serum: 1 mg/dL (ref 0.44–1.27)
E-GFR Creatinine (Male): 75 mL/min/{1.73_m2}
E-GFR, African American (Male): 87 mL/min/{1.73_m2}
Glucose: 105 mg/dL — ABNORMAL HIGH (ref 70–99)
Potassium: 4.7 mmol/L (ref 3.3–5.0)
Protein: 6.8 g/dL (ref 6.3–8.3)
Sodium: 134 mmol/L — ABNORMAL LOW (ref 135–145)
Urea Nitrogen, Blood (BUN): 10 mg/dL (ref 8–22)

## 2019-04-08 LAB — CBC WITH DIFFERENTIAL
Basophils % Auto: 0.9 %
Basophils Abs Auto: 0.1 10*3/uL (ref 0.0–0.2)
Eosinophils % Auto: 1.3 %
Eosinophils Abs Auto: 0.1 10*3/uL (ref 0.0–0.5)
Hematocrit: 44.4 % (ref 41.0–53.0)
Hemoglobin: 15.1 g/dL (ref 13.5–17.5)
Lymphocytes % Auto: 14.2 %
Lymphocytes Abs Auto: 0.9 10*3/uL — ABNORMAL LOW (ref 1.0–4.8)
MCH: 31.8 pg (ref 27.0–33.0)
MCHC: 34 % (ref 32.0–36.0)
MCV: 93.6 fL (ref 80.0–100.0)
MPV: 7.4 fL (ref 6.8–10.0)
Monocytes % Auto: 10.5 %
Monocytes Abs Auto: 0.6 10*3/uL (ref 0.1–0.8)
Neutrophils % Auto: 73.1 %
Neutrophils Abs Auto: 4.5 10*3/uL (ref 1.8–7.7)
Platelet Count: 217 10*3/uL (ref 130–400)
RDW: 14.1 % (ref 0.0–14.7)
Red Blood Cell Count: 4.75 10*6/uL (ref 4.50–5.90)
White Blood Cell Count: 6.2 10*3/uL (ref 4.5–11.0)

## 2019-04-08 LAB — THYROXINE, FREE (FREE T4): Thyroxine, Free (Free T4): 1.15 ng/dL (ref 0.56–1.64)

## 2019-04-08 LAB — TSH WITH FREE T4 REFLEX: Thyroid Stimulating Hormone: 3.43 u[IU]/mL — ABNORMAL HIGH (ref 0.35–3.30)

## 2019-04-08 MED ORDER — COMBIVENT RESPIMAT 20 MCG-100 MCG/ACTUATION SOLUTION FOR INHALATION
1.0000 | Freq: Four times a day (QID) | RESPIRATORY_TRACT | 3 refills | Status: DC
Start: 2019-04-08 — End: 2019-05-21

## 2019-04-08 MED ORDER — BUDESONIDE-FORMOTEROL HFA 160 MCG-4.5 MCG/ACTUATION AEROSOL INHALER
2.0000 | INHALATION_SPRAY | Freq: Two times a day (BID) | RESPIRATORY_TRACT | 11 refills | Status: DC
Start: 2019-04-08 — End: 2019-11-08

## 2019-04-08 MED ORDER — SPIRIVA RESPIMAT 2.5 MCG/ACTUATION SOLUTION FOR INHALATION
2.0000 | Freq: Every day | RESPIRATORY_TRACT | 11 refills | Status: DC
Start: 2019-04-08 — End: 2019-07-22

## 2019-04-08 NOTE — Nursing Note (Signed)
The patient has received a flu vaccine previously: yes  The Influenza Vaccine VIS document for the flu injection was given to patient to review. Patient or person named in permission has answered no to any history of egg allergy, previous serious reaction to a influenza vaccine or current illness which would preclude them receiving an immunization via protocol and policy 2041 without additional physician input. Any questions were referred to the physician. The Influenza Vaccine was then administered per protocol or by Policy 2041 using the standing order from Dr. James Kirk (Academic) or Dr. Kurt Slapnik (Network). The patient was observed for 0 minutes for immediate reactions to the vaccine per protocol. no reaction was observed.   Kenneth Gordon

## 2019-04-08 NOTE — Progress Notes (Signed)
Chief Complaint: Follow-up blood pressure, COPD  SUBJECTIVE:  Kenneth Gordon is a 74yr male here for follow-up on his blood pressure and COPD today.    He is doing overall well.  Tells me he just got over a significant COPD exacerbation where he was down for several weeks.  Was not seen in office, did called in and was advised to schedule an appointment.  He had received some prednisone from pulmonology for 5 days which did help.  Admits to not eating well during this time and has since lost nearly 30 pounds.  States his appetite is now returning and he is feeling better.  Does follow closely with pulmonology and has an upcoming appointment in a few weeks.  He needs refills of his inhalers which include Spiriva, Symbicort, Combivent.  He admits to using them more frequently during his recent exacerbation.  He continues to smoke at least 1/4 pack of cigarettes a day.    Blood pressures are well controlled, he continues on lisinopril.  No headache, dizziness, chest pain.  No side effects.    Also with history of high cholesterol, continues on Lipitor.    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  No issues currently.  Has upcoming follow-up with vascular surgery.    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  General  Appearance: alert, no distress, pleasant affect  Eyes:  conjunctivae and corneas clear.  Neck:  Neck supple. No adenopathy  Heart:  normal rate and regular rhythm, no murmurs  Lungs: Few coarse breath sounds bilaterally, otherwise clear to auscultation.  Abdomen: BS normal.  Abdomen soft, non-tender.  Extremities:  no cyanosis or edema.    ASSESSMENT/PLAN:  [name] was seen today for medication follow up.    Diagnoses and all orders for this visit:    Essential hypertension -pressures are well controlled today, continue lisinopril.    Chronic bronchitis, unspecified chronic bronchitis type ([place]) -currently stable, but recently got over a significant exacerbation.  Was not seen for this, to complete a 5-day course of prednisone.  His inhalers were refilled today.  Could have contributed to weight loss as below as he was not eating well during this time.  Has upcoming follow-up with pulmonology which he will keep.  Again discussed the importance of tobacco cessation.  -     Budesonide/Formoterol (SYMBICORT) 160-4.5 mcg/actuation Inhaler; Take 2 puffs by inhalation 2 times daily.  -     Albuterol/Ipratropium (COMBIVENT RESPIMAT) 20-100 mcg/actuation Inhaler; Take 1 puff by inhalation 4 times daily.  -     Tiotropium (SPIRIVA RESPIMAT) 2.5 mcg/actuation Inhaler; Take 2 sprays by inhalation every day.  -     DX CHEST  2 VIEWS; Future    Weight loss -nearly 30 pounds over the past 6 months.  Likely contribution from above.  Will obtain labs as below.  He is also agreeable to a chest x-ray today.  We again discussed obtaining a CT scan of the chest which he declined.  Increase caloric intake.  Could consider trial of Ensure or boost.  -     Comprehensive Metabolic Panel; Future  -     CBC with Differential; Future  -     TSH with Free T4 Reflex; Future    Popliteal artery aneurysm (HCC) -appears stable, upcoming follow-up with vascular surgery    Colon cancer screening  -     Fecal by Immunoassay (FIT); Future    Other  orders  -     INFLUENZA VACCINE (65 YO AND OLDER), HIGH-DOSE, QUADRIVALENT, NO LATEX, NO PRESERVATIVE    Barriers to Learning: none  Patient/Family Understanding: verbalizes    Follow up 3 months or PRN    Electronically signed by  [name]. Maple Hudson, MD  [facility]     This note was created using the support of Dragon [employer]. Please note any grammatical, sound-alike, or syntax errors as likely dictation errors.     To the patient: If you are reviewing this progress note and have questions about the meaning or medical terms being used, please schedule an appointment or bring it up at your next follow-up appointment. Medical notes are meant to be a communication tool between medical professionals and require medical terms to be used for efficiency. Thank you.

## 2019-04-08 NOTE — Nursing Note (Signed)
Patient ID confirmed using 2 identifiers.    Patient WAS wearing a surgical mask  Droplet precautions were followed when caring for the patient.   PPE used by provider during encounter: Surgical mask and Face Shield/Goggles    Vital signs taken, allergies verified, screened for pain, and med history taken.     Mammography screening for females between 50-74 years of age reviewed as appropriate.    Flu Vaccine status reviewed and updated as appropriate.      My Chart status reviewed and updated as appropriate.    I have asked the patient if they have received any medical treatment/consultations outside of the [facility] within the past 30 days.  The patient has indicated no to receiving services outside of the [facility].  If yes, I have requested information to ascertain these results.     Eeva Schlosser, M.A.

## 2019-04-11 ENCOUNTER — Encounter: Payer: Self-pay | Admitting: Family Medicine

## 2019-04-11 DIAGNOSIS — R053 Chronic cough: Secondary | ICD-10-CM

## 2019-04-11 DIAGNOSIS — R05 Cough: Secondary | ICD-10-CM

## 2019-04-19 ENCOUNTER — Other Ambulatory Visit: Payer: Self-pay | Admitting: Family Medicine

## 2019-04-20 ENCOUNTER — Other Ambulatory Visit: Payer: Self-pay | Admitting: PULMONARY DISEASE

## 2019-04-20 DIAGNOSIS — K219 Gastro-esophageal reflux disease without esophagitis: Secondary | ICD-10-CM

## 2019-04-20 NOTE — Telephone Encounter (Signed)
Service: Fellows Larey Dresser)    Last visit: 04/01/18    Background: Pt has not been seen in clinic in > 12 months.  Please advise.    Future visit: 04/28/19    Unable to refill prescription per RN prescription refill protocol. Deferring to provider for review.     Britta Mccreedy BSN, RN  Clinical Nurse II  [facility]  [phone]

## 2019-04-21 NOTE — Telephone Encounter (Signed)
Pls order xray

## 2019-04-27 NOTE — Progress Notes (Deleted)
Department of Internal Medicine  Division of Pulmonary, Sleep, and Critical Care Medicine  Patient Kenneth Gordon                             MRN [id number]       Date of Service: 04/28/2019    Dear Dr. Derrek Gu, Noreene Larsson,    We had the pleasure of seeing Kenneth Gordon in the [facility] today for follow up.  Please see below for our evaluation and recommendations for management.     ID: Kenneth Gordon is a 74yr old male patient who follows in pulmonary clinic for COPD    Interim Events: Since last pulmonary clinic visit on 04/01/18  - COPD exacerbation 03/04/19- prednisone x5d  - CXR 04/08/19  - Enrolled but did not perform Pulm Rehab as was still smoking    Subjective:  - CAT *** (Priors 18; 19; 18) (>10 poorly controlled; change in 2 MCID)  - mmRC 2 (Priors 2, 2, 3) (>2 poorly controlled)  - Max exertion: garage to clinic, cant climb stairs   - Perception of breathing is the same as prior.     Exacerbations:  - Last 03/04/19- pred x 5d  - Never been hospitalized for COPD exacerbation   - Leaf blower makes it worse    Meds:   - Combivent use 4-5x/day , states he uses it as a "habit". Albuterol nebs 2x/week   - Symbicort BID  - Spiriva Respimat daily     Still smokes 5 cigs/day (from 1 pack/day). Tried smoking cessation number.  Restarted Chantix.     Review of Systems:  Cough, dyspnea on exertion   Occasional GERD q3-4 mo. Better with carbonated water     All other systems reviewed during the visit are negative, except as noted above.  The negative ROS include absence of other constitutional (fevers, chills, night sweats, unintentional weight loss), nasal ulcers, dry mouth, chest pain, dysphagia, musculoskeletal (joint pain, synovitis), skin including rashes and purpura, neurological including mononeuropathies (e. g. peripheral nerve pain affecting hands, arms, legs), and adverse reactions to prescribed drugs.    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 Active 1/2 pack per day    Medications/Allergies:  Medications and Allergies were reviewed with the patient      Current Outpatient Medications on File Prior to Visit   Medication    Acitretin (SORIATANE) 25 mg Capsule    Albuterol (ACCUNEB) 1.25 mg/3 mL nebulizer solution    Albuterol/Ipratropium (COMBIVENT RESPIMAT) 20-100 mcg/actuation Inhaler    Aspirin 81 mg Chewable Tablet    Atorvastatin (LIPITOR) 40  mg tablet    Azelastine Nasal (ASTELIN) 137 mcg (0.1 %) Spray    Budesonide/Formoterol (SYMBICORT) 160-4.5 mcg/actuation Inhaler    FamoTIDine (PEPCID) 40 mg Tablet    Fluticasone (FLONASE) 50 mcg/actuation nasal spray    Lisinopril (PRINIVIL, ZESTRIL) 20 mg Tablet    Tiotropium (SPIRIVA RESPIMAT) 2.5 mcg/actuation Inhaler     No current facility-administered medications on file prior to visit.      PHYSICAL EXAM:  Vital Signs: There were no vitals taken for this visit.   Constitutional: The patient is well-developed. No acute distress.   Ear: External inspection normal  Nose: Sinuses non-tender.  Mouth & Throat:  Oropharynx is benign. No oral thrush.  Neck:   No palpable lymphadenopathy.   Respiratory:  Normal respiratory effort and work of breathing. No wheezing noted. Hyperresonant. Chest wall  enlargement.   Cardiovascular:  Regular rate and regular rhythm. No murmur, rub, or gallop.   Skin: Psoriasis plaques on elbows  Musculoskeletal: No peripheral edema.  Neuro: No focal neurological findings.    LAB TESTS  Hematology:  Lab Results   Component Value Date    WBC 6.2 04/08/2019    HGB 15.1 04/08/2019    AUTOEOS 1.3 04/08/2019    AUTOEOSABS 0.1 04/08/2019    IGE 75.8 (H) 10/16/2017     Chemistry:  Lab Results   Lab Name Value Date/Time    CO2 31 04/08/2019 11:04 AM    CO2 30 12/30/2014 07:14 AM    BUN 10 04/08/2019 11:04 AM    BUN 17 12/30/2014 07:14 AM    CR 1.00 04/08/2019 11:04 AM    CR 0.97 12/30/2014 07:14 AM     Coagulation:  Lab Results   Component Value Date    PLT 217 04/08/2019    INR 1.75 (H) 10/16/2017    APTT 26.5 11/01/2016     a1AT- 186 mg/dL  Phenotype M2M2  CRP 0.5  Fibrinogen Degradation Product <10   RAST Panel- negative   FENO- 20     IMAGING/STUDIES I personally reviewed the chest imaging with my attending    CXR 04/08/19   - Pulmonary hyperinflation c/w COPD  - No nodules by CXR    TTE 08/04/17  1. The LV systolic function is normal. The estimated LV ejection fraction is 60 %.  2. Trivial pericardial effusion.  3. Decreased left ventricular internal cavity size.  4. Mild aortic valve sclerosis without stenosis.  5. Mild concentric left ventricular hypertrophy.  6. The left ventricular diastolic function is abnormal. Stage I: Impaired early left ventricular relaxation (Abnormal relaxation pattern).  7. The left atrium is normal in size and structure.  8. The right atrium is normal in size and structure.    PFT 10/06/17    Very severe COPD is confirmed by reduced expiratory flow rates, volumes, and the flow volume loop. Severe lung hyperinflation. DLco is severely reduced probably because of pulmonary emphysema but anemia, ILD, pulmonary vascular and drug-induced lung disease should remain a concern.     6MWT 08/03/18      ASSESSMENT AND PLAN  Kenneth Gordon is a 74yr old male here for:    #  COPD  Severe Obstruction.   GOLD Class: D   Phenotype: Emphysematous  Therapy:  - Bronchodilator therapy:    Rescue- Combivent MDI q4h PRN, albuterol nebs    Controller- Symbicort (ICS/LABA), Spiriva (LAMA)   Exacerbation prevention- Patient not a candidate for chronic azithromycin or Roflumilast at this time as no frequent exacerbations  In patients with severe air trapping or diminished PIF, nebulized bronchodilators and inhaled corticosteroids can offer superior drug delivery. Patient wanting to defer at this time  - Pulmonary Rehab. Patient enrolled. Working on smoking cessation prior to him being accepted. ***  - Patient not a candidate for lung reduction surgery given ongoing smoking   - Recommend early palliative care involvement      Evaluation:  - Full PFTs with BD response yearly ***  - Oximetry testing at home  - Annual low dose Chest CT for lung cancer screening recommended and discussed with patient ***    # Tobacco  Dependence   - I counseled the patient on smoking cessation and provided [facility] resources to assist the patient on this goal  - Nicotine lozenges, patches, gum tried without success  - Continue Varencicline (Chantix) ***    # Nasal Allergies- currently controlled  - Flonase, Astelin sprays as needed     # GERD  - Famotidine 40mg  daily and PRN    # Health Maintenance  - Recommend Influenza, Prevnar, Pneumovax, Tdap, COVID vaccinations per CDC guidelines  - Due for TdAP at this time    EDUCATION:  I educated/instructed the patient or caregiver regarding all aspects of the above stated plan of care.  The patient or caregiver indicated understanding.     We will plan to see in clinic in *** months for follow-up.  Please do not hesitate to contact [name] with any additional questions or concerns.    Patient was seen and discussed with Dr. [place]     Sincerely,  Electronically signed by:  Marland Kitchen, MD  Fellow, Division of Pulmonary and Critical Care Medicine  Department of  Internal Medicine  Pager [phone]  [facility] # [phone]      [address], [address]  [place]  Phone: [phone]  Fax: [phone]

## 2019-04-28 ENCOUNTER — Ambulatory Visit: Payer: Medicare Other | Admitting: PULMONARY DISEASE

## 2019-05-03 ENCOUNTER — Other Ambulatory Visit: Payer: Self-pay | Admitting: Family Medicine

## 2019-05-03 DIAGNOSIS — J301 Allergic rhinitis due to pollen: Secondary | ICD-10-CM

## 2019-05-03 MED ORDER — FLUTICASONE PROPIONATE 50 MCG/ACTUATION NASAL SPRAY,SUSPENSION: 1.0000 | Freq: Every day | NASAL | 1 refills | Status: DC

## 2019-05-03 NOTE — Telephone Encounter (Signed)
This is a  REFILL AUTHORIZATION for the faxed request for prescription number [id number].Last date filled 02/01/19.

## 2019-05-05 ENCOUNTER — Telehealth: Payer: Self-pay | Admitting: Family Medicine

## 2019-05-05 ENCOUNTER — Ambulatory Visit: Payer: Medicare Other | Attending: PULMONARY DISEASE | Admitting: PULMONARY DISEASE

## 2019-05-05 DIAGNOSIS — Z720 Tobacco use: Secondary | ICD-10-CM | POA: Insufficient documentation

## 2019-05-05 DIAGNOSIS — Z79899 Other long term (current) drug therapy: Secondary | ICD-10-CM

## 2019-05-05 DIAGNOSIS — J449 Chronic obstructive pulmonary disease, unspecified: Secondary | ICD-10-CM

## 2019-05-05 NOTE — Telephone Encounter (Signed)
Received fax from pharmacy.  Kenneth Gordon requires PA.

## 2019-05-05 NOTE — Telephone Encounter (Signed)
Per insurance no PA is required. Called pharmacy and got a paid claim. Thank you

## 2019-05-05 NOTE — Progress Notes (Addendum)
Department of Internal Medicine  Division of Pulmonary, Sleep, and Critical Care Medicine    Video Visit Note     I performed this clinical encounter by utilizing a real time telehealth video connection between my location and the patient's location. The patient's location was confirmed during this visit. I obtained verbal consent from the patient to perform this clinical encounter utilizing video and prepared the patient by answering any questions they had about the telehealth interaction.    I spent a total of 45 minutes today on this patient's visit. This included 30 minutes of face to face time, more than 50% of which was spent in counseling or coordinating care, for the following medical problem(s) COPD. The remainder of the time was spent on review of the patient record (including but not limited to clinical notes, outside records, laboratory and radiographic studies), medical decision-making, and documentation of the visit.     Patient Kenneth Gordon                             MRN [id number]       Date of Service: 05/05/2019    Dear Dr. Derrek Gu, Noreene Larsson,    We had the pleasure of seeing ANUJ SUMMONS in the [facility] today for follow up.  Please see below for our evaluation and recommendations for management.     ID: Kenneth Gordon is a 74yr old male patient who follows in pulmonary clinic for COPD    Interim Events: Since last pulmonary clinic visit on 04/01/18  - COPD exacerbation 03/04/19- prednisone x5d  - CXR 04/08/19- lung hyperinflation   - Enrolled but did not perform Pulm Rehab as was still smoking    Subjective:  - CAT not performed today (Priors 18; 19; 18) (>10 poorly controlled; change in 2 MCID)  - mmRC 2 (Priors 2, 2, 3) (>2 poorly controlled)  - Max exertion:100 feet. garage to clinic, can't climb stairs   - Perception of breathing is the same as prior.   - Pulse ox 88-90s% in AM. With walking >100 feet 95%.      Exacerbations:  - Last 03/04/19- pred x 5d  - Never been  hospitalized for COPD exacerbation   - Leaf blower makes it worse    Meds:   - Combivent use 4-5x/day , states he uses it as a "habit". Albuterol nebs 2x/week   - Symbicort BID- benefits from this in evening as helped chest tightness   - Spiriva Respimat daily     - Still smokes 6-8 cigs/day (from 1 pack/day). Tried smoking cessation number. Restarted Chantix.     Review of Systems:  Cough, dyspnea on exertion   Occasional GERD q3-4 mo. Better with carbonated water     All other systems reviewed during the visit are negative, except as noted above.  The negative ROS include absence of other constitutional (fevers, chills, night sweats, unintentional weight loss), nasal ulcers, dry mouth, chest pain, dysphagia, musculoskeletal (joint pain, synovitis), skin including rashes and purpura, neurological including mononeuropathies (e. g. peripheral nerve pain affecting hands, arms, legs), and adverse reactions to prescribed drugs.    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 Active 1/2 pack per day    Medications/Allergies:  Medications and  Allergies were reviewed with the patient      Current Outpatient Medications on File Prior to Visit   Medication    Acitretin (SORIATANE) 25 mg Capsule    Albuterol (ACCUNEB) 1.25 mg/3 mL nebulizer solution    Albuterol/Ipratropium (COMBIVENT RESPIMAT) 20-100 mcg/actuation Inhaler    Aspirin 81 mg Chewable Tablet    Atorvastatin (LIPITOR) 40 mg tablet    Azelastine Nasal (ASTELIN) 137 mcg (0.1 %) Spray    Budesonide/Formoterol (SYMBICORT) 160-4.5 mcg/actuation Inhaler    FamoTIDine (PEPCID) 40 mg Tablet    Fluticasone (FLONASE) 50 mcg/actuation nasal spray    Lisinopril (PRINIVIL, ZESTRIL) 20 mg Tablet    Tiotropium (SPIRIVA RESPIMAT) 2.5 mcg/actuation Inhaler     No current facility-administered medications on file prior to visit.      PHYSICAL EXAM:  Video visit   Constitutional: The patient is well-developed. No acute distress.   Respiratory:  Normal respiratory effort and work of breathing.   Skin: Psoriasis plaques on elbows    LAB TESTS  Hematology:  Lab Results   Component Value Date    WBC 6.2 04/08/2019    HGB 15.1 04/08/2019    AUTOEOS 1.3 04/08/2019    AUTOEOSABS 0.1 04/08/2019    IGE 75.8 (H) 10/16/2017     Chemistry:  Lab Results   Lab Name Value Date/Time    CO2 31 04/08/2019 11:04 AM    CO2 30 12/30/2014 07:14 AM    BUN 10 04/08/2019 11:04 AM    BUN 17 12/30/2014 07:14 AM    CR 1.00 04/08/2019 11:04 AM    CR 0.97 12/30/2014 07:14 AM     Coagulation:  Lab Results   Component Value Date    PLT 217 04/08/2019    INR 1.75 (H) 10/16/2017    APTT 26.5 11/01/2016     a1AT- 186 mg/dL  Phenotype M2M2  CRP 0.5  Fibrinogen Degradation Product <10   RAST Panel- negative   FENO- 20     IMAGING/STUDIES I personally reviewed the chest imaging with my attending    CXR 04/08/19   - Pulmonary hyperinflation c/w COPD  - No nodules by CXR    TTE 08/04/17  1. The LV systolic function is normal. The estimated LV ejection fraction is 60 %.  2. Trivial pericardial effusion.  3. Decreased left ventricular internal  cavity size.  4. Mild aortic valve sclerosis without stenosis.  5. Mild concentric left ventricular hypertrophy.  6. The left ventricular diastolic function is abnormal. Stage I: Impaired early left ventricular relaxation (Abnormal relaxation pattern).  7. The left atrium is normal in size and structure.  8. The right atrium is normal in size and structure.    PFT 10/06/17    Very  severe COPD is confirmed by reduced expiratory flow rates, volumes, and the flow volume loop. Severe lung hyperinflation. DLco is severely reduced probably because of pulmonary emphysema but anemia, ILD, pulmonary vascular and drug-induced lung disease should remain a concern.     08/03/18      ASSESSMENT AND PLAN  Glen is a 74yr old male here for:    # COPD  Severe Obstruction. Last FEV1 24%  GOLD Class: D   Phenotype: Emphysematous   Biggest current barrier to improvement is smoking cessation  Therapy:  - Bronchodilator therapy:    Rescue- Combivent MDI q4h PRN, albuterol nebs    Controller- Symbicort (ICS/LABA), Spiriva (LAMA)   Exacerbation prevention- Patient not a candidate for chronic azithromycin or Roflumilast at this time as no frequent exacerbations    In patients with severe air trapping or diminished peak inspiratory flows, nebulized bronchodilators and inhaled corticosteroids can offer superior drug delivery. Patient wanting to defer at this time  - Pulmonary Rehab. Patient enrolled. Working on smoking cessation prior to him being accepted.   - Patient not a candidate for lung reduction surgery or endobronchial valve placement given ongoing smoking   - Recommend early palliative care involvement in all patients with severe COPD     Evaluation:  - Instructed patient to monitor his pulse oximetry at home and notify our office if his sPO2 sustains <88%  - Annual low dose Chest CT for lung cancer screening recommended and discussed with patient but he declines.    # Tobacco  Dependence   - I counseled the patient on smoking  cessation and provided [place] resources to assist the patient on this goal. Referred him to [place] smoking cessation quit line.   - Nicotine lozenges, patches, gum tried without success  - Continue Varencicline (Chantix)     # Nasal Allergies- currently controlled  - Flonase, Astelin sprays as needed     # GERD- well controlled  - Famotidine 40mg  daily and PRN    # Health Maintenance  - Recommend Influenza, Prevnar, Pneumovax, Tdap, COVID vaccinations per CDC guidelines  - Due for TdAP at next clinic visit    EDUCATION:  I educated/instructed the patient or caregiver regarding all aspects of the above stated plan of care.  The patient or caregiver indicated understanding.      does not need follow up in Pulmonary clinic and is to follow with his PCP for further management of his COPD. If he is able to achieve smoking cessation for a prolonged period of time, he can be referred to the COPD clinic for consideration of EBV placement and Pulmonary Rehab.  Please do not hesitate to contact [name] with any additional questions or concerns.    Patient was seen and discussed with Dr. [place]     Sincerely,  Electronically signed by:  Lorelee Cover, MD  Fellow, Division of Pulmonary and Critical Care Medicine  Department of Internal Medicine      [address], [address]  [place], [place]  Phone: [phone]  Fax: [phone]   I have seen  the patient personally with the Pulm/CCM fellow.  I concur with the history and physical as detailed in their note except as noted here.  Plan developed jointly with the fellow. I agree with the plan as detailed.   ROS negative except as described in resident HPI.    Studies:   Assessment and Plan  Lucita Ferrara, M.D.,M.A.S.  Professor of [employer]  Chief, Division of Pulmonary, Critical Care, Sleep Medicine

## 2019-05-11 ENCOUNTER — Encounter: Payer: Self-pay | Admitting: PULMONARY DISEASE

## 2019-05-11 DIAGNOSIS — Z87891 Personal history of nicotine dependence: Secondary | ICD-10-CM

## 2019-05-11 MED ORDER — VARENICLINE TARTRATE 0.5 MG (11)-1 MG (42) TABLETS IN A DOSE PACK
ORAL_TABLET | ORAL | 3 refills | Status: AC
Start: 2019-05-11 — End: 2020-05-05

## 2019-05-11 NOTE — Telephone Encounter (Signed)
Reviewed last patient encounter.  Per encounter note:    Tobacco  Dependence   - I counseled the patient on smoking cessation and provided  resources to assist the patient on this goal. Referred him to [place] smoking cessation quit line.   - Nicotine lozenges, patches, gum tried without success  - Continue Varencicline (Chantix)     Pt's preferred pharmacy updated.  Will forward request for prescription to provider for review.    Britta Mccreedy BSN, RN  Clinical Nurse II  [facility]  [phone]

## 2019-05-11 NOTE — Telephone Encounter (Signed)
From: [name]  To: Vicente Masson, MD  Sent: 05/10/2019 2:32 PM PST  Subject: Visit Follow-up Question    HI    Wasn't a prescription for Chantix suppose to be sent to [facility]?    [name]

## 2019-05-17 ENCOUNTER — Ambulatory Visit: Payer: Medicare Other | Admitting: Vascular Surgery

## 2019-05-17 ENCOUNTER — Other Ambulatory Visit: Payer: Self-pay | Admitting: Family Medicine

## 2019-05-17 ENCOUNTER — Ambulatory Visit: Payer: Medicare Other

## 2019-05-17 MED ORDER — LISINOPRIL 20 MG TABLET
20.0000 mg | ORAL_TABLET | Freq: Every day | ORAL | 1 refills | Status: DC
Start: 2019-05-17 — End: 2019-11-08

## 2019-05-17 NOTE — Telephone Encounter (Signed)
This is a  REFILL AUTHORIZATION for the faxed request for prescription number [phone].Last date filled 02/06/19.

## 2019-05-18 ENCOUNTER — Telehealth: Payer: Self-pay | Admitting: Vascular Surgery

## 2019-05-18 NOTE — Telephone Encounter (Signed)
Spoke to patient to see if he wanted to reschedule no show appt with Dr Waymon Budge on 02/22/2021and VL  Patient stated he did but not at the moment, he said he will be returning the call to reschedule     Kenneth Gordon  [facility]  [address], [address]  [place], [place] [address]  Phone: [phone]  Fax: [phone]

## 2019-05-20 ENCOUNTER — Other Ambulatory Visit: Payer: Self-pay | Admitting: Internal Medicine

## 2019-05-20 DIAGNOSIS — J42 Unspecified chronic bronchitis: Secondary | ICD-10-CM

## 2019-05-21 ENCOUNTER — Encounter: Payer: Self-pay | Admitting: Family Medicine

## 2019-05-21 ENCOUNTER — Telehealth: Payer: Self-pay | Admitting: FAMILY PRACTICE

## 2019-05-21 DIAGNOSIS — J42 Unspecified chronic bronchitis: Secondary | ICD-10-CM

## 2019-05-21 MED ORDER — COMBIVENT RESPIMAT 20 MCG-100 MCG/ACTUATION SOLUTION FOR INHALATION
1.0000 | Freq: Four times a day (QID) | RESPIRATORY_TRACT | 0 refills | Status: AC
Start: 2019-05-21 — End: 2020-05-15

## 2019-05-21 NOTE — Telephone Encounter (Signed)
Received electronic request from pharmacy. Last seen N/A. Last refill 04/08/19 ,Last Quantity  3 , Last Refill's 3.    Please Advise    Wayna ChaletArmy Chaco

## 2019-05-21 NOTE — Telephone Encounter (Signed)
Call from [place]. Patient is almost out of Combivent.     ASSESSMENT: Refill request. Prescription Combivent MDI sent to preferred pharmacy. Should follow up with PCP for longer-term refills.

## 2019-05-22 ENCOUNTER — Other Ambulatory Visit: Payer: Self-pay | Admitting: FAMILY PRACTICE

## 2019-05-22 DIAGNOSIS — J42 Unspecified chronic bronchitis: Secondary | ICD-10-CM

## 2019-05-24 ENCOUNTER — Encounter: Payer: Self-pay | Admitting: Family Medicine

## 2019-05-24 NOTE — Telephone Encounter (Signed)
From: [name]  To: [name]. Maple Hudson, MD  Sent: 05/21/2019 5:01 PM PST  Subject: MyChart Refill Request    I have an urgent request to get my Combivent prescription refilled ASAP. I am almost out and this is my emergency inhaler. I have three or four more puffs left in this inhaler.    [name]

## 2019-05-24 NOTE — Telephone Encounter (Signed)
Prescription was sent 05/22/19.

## 2019-05-25 NOTE — Telephone Encounter (Signed)
Duplicate Request.    Prescription sent 05/02/2019.

## 2019-07-06 NOTE — Progress Notes (Deleted)
CDI Query   ADDITIONAL CLARIFICATION REQUESTED  Peripheral artery disease (PAD)    Please indicate if any of the following conditions(s) coexisted and affected patient care, treatment, or management on 07/07/2019 during your face-to-face patient encounter.   Please enter your response(s) in the "ANSWER" space provided below.    CLINICAL FINDINGS:  1.  Diagnosis and documentation of "Peripheral artery disease (PAD)" and record review reveals extensive vascular surgical history (lower extremity stents, thrombectomy, angioplasty).  If applicable, please provide diagnosis and supporting documentation associated with findings to clarify condition status for 2021.         QUESTION:  Can the patient's coexisting condition be clarified as:    1.  Peripheral artery disease (PAD)  2.  Other explanation/diagnosis:   (Please document and include support)  3.  Clinically undetermined  4.  Not addressed at today's encounter                ANSWER:       RISK FACTORS: age, current smoker, hypertension    TREATMENT:  Monitoring, routine scheduled appointments, followed by vascular     (To close this note, please mark "Accept" then "Sign").    Respectfully,    Marikay Alar BSN, RN, CRC, CDEO, [facility]   Clinical Documentation Integrity: Outpatient  Health Information Management (HIM) Division Patient Financial Services  [facility]  [email or web address]  C. [phone]

## 2019-07-07 ENCOUNTER — Ambulatory Visit: Payer: Medicare Other | Admitting: Family Medicine

## 2019-07-22 ENCOUNTER — Other Ambulatory Visit: Payer: Self-pay | Admitting: Internal Medicine

## 2019-07-22 DIAGNOSIS — J42 Unspecified chronic bronchitis: Secondary | ICD-10-CM

## 2019-07-22 MED ORDER — SPIRIVA RESPIMAT 2.5 MCG/ACTUATION SOLUTION FOR INHALATION
2.0000 | Freq: Every day | RESPIRATORY_TRACT | 11 refills | Status: DC
Start: 2019-07-22 — End: 2021-02-12

## 2019-07-22 NOTE — Telephone Encounter (Signed)
Service: Fellows Larey Dresser)    Last visit: 05/05/19    Background: - Bronchodilator therapy:               Rescue- Combivent MDI q4h PRN, albuterol nebs               Controller- Symbicort (ICS/LABA), Spiriva (LAMA)    Future visit: None scheduled    Prescription refilled per RN prescription refill protocol.     Britta Mccreedy BSN, RN  Clinical Nurse II  [facility]  [phone]

## 2019-08-24 ENCOUNTER — Other Ambulatory Visit: Payer: Self-pay | Admitting: Family Medicine

## 2019-08-24 DIAGNOSIS — I724 Aneurysm of artery of lower extremity: Secondary | ICD-10-CM

## 2019-08-24 MED ORDER — ATORVASTATIN 40 MG TABLET
40.0000 mg | ORAL_TABLET | Freq: Every day | ORAL | 3 refills | Status: DC
Start: 2019-08-24 — End: 2020-08-28

## 2019-08-24 NOTE — Telephone Encounter (Signed)
This is a  REFILL AUTHORIZATION for the faxed request for prescription number [phone].Last date filled 05/25/19.

## 2019-08-25 ENCOUNTER — Encounter: Payer: Self-pay | Admitting: PULMONARY DISEASE

## 2019-08-25 DIAGNOSIS — F17200 Nicotine dependence, unspecified, uncomplicated: Secondary | ICD-10-CM

## 2019-08-25 MED ORDER — VARENICLINE TARTRATE 1 MG TABLET
1.0000 mg | ORAL_TABLET | Freq: Two times a day (BID) | ORAL | 3 refills | Status: AC
Start: 2019-08-25 — End: 2020-08-19

## 2019-08-25 NOTE — Telephone Encounter (Signed)
Service: Fellows Larey Dresser)    Last visit: 05/05/19    Background: - Continue Varencicline (Chantix)     Pt needing maintenance dosing pack (not on active med list).  New order pended for MD to review and sign.    Future visit: None scheduled    Unable to refill prescription per RN prescription refill protocol. Deferring to provider for review.     Britta Mccreedy BSN, RN  Clinical Nurse II  [facility]  [phone]

## 2019-09-28 NOTE — Telephone Encounter (Addendum)
Received fax that Chantix is backordered with no release date. Called [employer] and confirmed they have med in stock.

## 2019-10-29 ENCOUNTER — Other Ambulatory Visit: Payer: Self-pay | Admitting: Family Medicine

## 2019-10-29 DIAGNOSIS — J301 Allergic rhinitis due to pollen: Secondary | ICD-10-CM

## 2019-10-29 NOTE — Telephone Encounter (Signed)
Last refill: 07/28/2019    Last seen 04/08/19

## 2019-11-07 ENCOUNTER — Other Ambulatory Visit: Payer: Self-pay | Admitting: Family Medicine

## 2019-11-07 DIAGNOSIS — J42 Unspecified chronic bronchitis: Secondary | ICD-10-CM

## 2019-11-08 MED ORDER — LISINOPRIL 20 MG TABLET
20.0000 mg | ORAL_TABLET | Freq: Every day | ORAL | 1 refills | Status: DC
Start: 2019-11-08 — End: 2020-05-01

## 2019-11-08 NOTE — Telephone Encounter (Signed)
This is a  REFILL AUTHORIZATION for the faxed request for prescription number [phone].Last date filled 08/11/19 (lisinopril).

## 2019-11-08 NOTE — Telephone Encounter (Signed)
Last refill: 10/09/2019    Last seen 04/08/19

## 2019-12-06 ENCOUNTER — Encounter: Payer: Self-pay | Admitting: Family Medicine
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
## 2019-12-07 ENCOUNTER — Telehealth: Payer: Self-pay | Admitting: Family Medicine

## 2019-12-07 MED ORDER — PREDNISONE 20 MG TABLET
40.0000 mg | ORAL_TABLET | Freq: Every day | ORAL | 0 refills | Status: AC
Start: 2019-12-07 — End: 2019-12-12

## 2019-12-07 NOTE — Telephone Encounter (Signed)
3 patient identifiers used    Spoke with pt and relayed message from MD. Scheduled VV for Thursday 9/16.    Advised to call clinic back if further questions or concerns arise.    Jerral Bonito, RN  PCN Triage

## 2019-12-07 NOTE — Telephone Encounter (Signed)
I sent in the prednisone. Yes, should at least see for video visit on Thursday.
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
## 2019-12-08 ENCOUNTER — Telehealth: Payer: Self-pay | Admitting: Family Medicine

## 2019-12-08 NOTE — Telephone Encounter (Signed)
General Advice / Message to MD:      For Dr. Maple Hudson     Patient is calling to see if he can get generic for chantix - ( name of the medication is - (APO- Barenicline)     To reach patient [phone]          Please advise,        Thank you    Koleen Nimrod - [facility]

## 2019-12-08 NOTE — Telephone Encounter (Signed)
Kenneth Gordon is a 74yr old male  3 patient identifiers used    Reviewed message from MA with patient. Questions were answered and no further questions or concerns at this time. Advised to please callback to the advice line at any time if questions arise.    Noralee Space, RN  PCN Triage

## 2019-12-08 NOTE — Telephone Encounter (Signed)
Unfortunately because there has been a re call it is very hard to find.  He will have to call around and check with different pharmacies.     I do not know of any pharmacies that have it in stock.

## 2019-12-08 NOTE — Telephone Encounter (Signed)
Called and spoke to [place] at [facility].  She states she was able to locate some chantix in their pharmacy and will call patient.

## 2019-12-08 NOTE — Telephone Encounter (Signed)
Kenneth Gordon is a 74yr old male  3 patient identifiers used    Consult with MD: Please advise.     [facility] is out of Varenicline (CHANTIX) 1 mg Tablet [[id number]]. Asking where he can get the Chantix. Is not interested in alternatives as Chantix works well for pt.     Noralee Space, RN  PCN Triage

## 2019-12-08 NOTE — Telephone Encounter (Signed)
Varenicline is chantix and would be the same order. Does his pharmacy have this?

## 2019-12-09 ENCOUNTER — Ambulatory Visit: Payer: Medicare Other | Admitting: Family Medicine

## 2019-12-09 DIAGNOSIS — Z720 Tobacco use: Secondary | ICD-10-CM

## 2019-12-09 DIAGNOSIS — J441 Chronic obstructive pulmonary disease with (acute) exacerbation: Secondary | ICD-10-CM
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
## 2019-12-15 ENCOUNTER — Telehealth: Payer: Self-pay | Admitting: Family Medicine

## 2019-12-15 DIAGNOSIS — J441 Chronic obstructive pulmonary disease with (acute) exacerbation: Secondary | ICD-10-CM

## 2019-12-15 MED ORDER — PREDNISONE 20 MG TABLET
ORAL_TABLET | ORAL | 0 refills | Status: AC
Start: 2019-12-15 — End: 2019-12-24

## 2019-12-15 NOTE — Telephone Encounter (Signed)
[name] is a 74yr old male  3 patient identifiers used    Consult with MD: Please advise.     Reviewed message from MD with patient who states he is unable to come to OV as he may not be able to make the journey to his car. As pt described to this RN, has to stop around 4 times to catch breath from apartment to car. When RN asked pt earlier if he was SOB on exertion, pt denied. Pt states he is not SOB at rest but states if walks longer distances, does have to stop and catch his breath. Advised pt to call back if symptoms worsen. Advised if pt starts having SOB at rest pt can call back but will be advised to go to ER. No wheezing or stridor heard during triage. Pt speaking in full, complete sentences with clear speech.    Noralee Space, RN  PCN Triage    Herrador, Serita Grit, MD  You 1 minute ago (3:12 PM)     Dr. Maple Hudson will be back tomorrow morning, so I will leave this message for him in case he has an alternate plan, but if his O2 sats are holding steady around 91% and he already did COPD tx, then I would recommend he be re-evaluated in clinic. You can schedule patient in any available same day slot with his PCP this week (sooner the better). If nothing available including same day slots for Dr. Maple Hudson, ok to place on my schedule instead, but would be best for the PCP to see the patient if at all possible.     -Dr. Rexene Edison    Message text

## 2019-12-15 NOTE — Telephone Encounter (Signed)
Kenneth Gordon is a 74yr old male  3 patient identifiers used.  Per:   patient.    Disposition: Advice given per clear triage protocol  Consult with MD: Please advise.   Per:   patient verbalizes agreement to plan. Agrees to callback with any increase in symptoms/concerns or questions.     ClearTriage Note: VV with PCP on 09/16 for COPD exacerbation. Monday and Tuesday, O2 level was between 88% - 91% with an average of 90%. Pt had lots of nasal drainage with post nasal drip on Monday. Benadryl slowed it down. States O2 levels were much the same yesterday. Had no energy, "zip." Yesterday, couldn't do anything because pt had no energy. Toward the evening, felt like was running out of breath at rest. PredniSONE (DELTASONE) 20 mg Tablet [[id number]] completed on 09/19.     States had a little improvement this morning. O2 today is 90%-92% with average of 91%. "Right this second, I feel pretty good. I don't have any energy to speak of." States PCP mentioned putting pt on another dose of Prednisone. States O2 is usually around 92%-95%. BP was 120/83 today. If gets up and walks for short distance, has to come back and sit down. States can only exert self a little bit before feeling like needs to sit down. States is not breathing hard when this occurs. Denies difficulty breathing, states is stopping before gets to that point.     No wheezing or stridor heard during triage. Pt speaking in full, complete sentences with clear speech.    Protocol Used: COPD Oxygen Monitoring and Hypoxia (Adult)  Protocol-Based Disposition: Call PCP or Video Visit within 24 Hours    Video visit not offered    Positive Triage Question:  * [1] Monitoring oxygen level (e.g., pulse oximetry) AND [2] fall in oxygen level 4% or more (below known patient baseline, while awake and resting)    Care Advice Discussed:  * Reasons To Call Back      - Breathing difficulty that is new or getting worse      - Fever occurs      - You become worse.    Negative  Triage Questions:  * SEVERE difficulty breathing (e.g., struggling for each breath, speaks in single words, pulse > 120)  * Bluish (or gray) lips or face now  * Difficult to awaken or acting confused (e.g., disoriented, slurred speech)  * Sounds like a life-threatening emergency to the triager  * [1] MODERATE difficulty breathing (e.g., speaks in phrases, SOB even at rest) AND [2] worse than normal  * Patient sounds very sick or weak to the triager  * Fever > 100.4 F (38.0 C)  * Nurse judgment  * [1] Monitoring oxygen level (e.g., pulse oximetry) AND [2] fall in oxygen level 4% or more (below known patient baseline, while awake and resting) AND [3] new difficulty breathing or worse than when discharged from hospital  * [1] Monitoring respiratory rate AND [2] rise in respiratory rate 4 or more breaths per minute (above known patient baseline, while awake and resting) AND [3] new difficulty breathing or worse than when discharged from hospital  * [1] Monitoring heart rateHAND [2] heart rate > 120 beats per minute  * [1] Caller has URGENT question AND [2] triager unable to answer question  * [1] MILD difficulty breathing (e.g., minimal/no SOB at rest, SOB with walking) AND [2] worse than normal  * Coughing up rusty-colored (reddish-brown) sputum  * Fever present >  3 days (72 hours)    Noralee Space RN  PCN Triage

## 2019-12-15 NOTE — Telephone Encounter (Signed)
I agree with appointment, but if he is having that much trouble he should really consider going to ER.   I did send a prednisone taper to his pharmacy, but I recommend ER/calling 911 if his symptoms worsen at all.

## 2019-12-15 NOTE — Telephone Encounter (Signed)
3 patient identifiers used    Spoke with pt and relayed message from MD. Pt verbalized understanding.    Advised to call clinic back if there are further questions or concerns.    Yarieliz Wasser, RN  PCN Triage

## 2019-12-30 ENCOUNTER — Encounter: Payer: Self-pay | Admitting: Internal Medicine

## 2019-12-30 DIAGNOSIS — J42 Unspecified chronic bronchitis: Secondary | ICD-10-CM

## 2019-12-30 NOTE — Telephone Encounter (Signed)
Received electronic request from pharmacy. Last seen N/A. Last refill 05/21/19 ,Last Quantity  1 , Last Refill's 0.    Please Advise    Wayna ChaletArmy Chaco

## 2019-12-31 ENCOUNTER — Telehealth: Payer: Self-pay | Admitting: Family Medicine

## 2019-12-31 MED ORDER — COMBIVENT RESPIMAT 20 MCG-100 MCG/ACTUATION SOLUTION FOR INHALATION
1.0000 | Freq: Four times a day (QID) | RESPIRATORY_TRACT | 3 refills | Status: DC
Start: 2019-12-31 — End: 2020-07-09

## 2019-12-31 NOTE — Telephone Encounter (Signed)
Rx sent 

## 2019-12-31 NOTE — Telephone Encounter (Signed)
Patient is calling following up on the status of the mediction. He state he is out of the medication.     [phone] - to reach patient.        Please advise,     [facility] youChristoper Gordon [facility]

## 2019-12-31 NOTE — Telephone Encounter (Signed)
There is no documentation attached to this message?

## 2020-02-01 ENCOUNTER — Encounter: Payer: Self-pay | Admitting: Family Medicine

## 2020-02-01 NOTE — Telephone Encounter (Signed)
Last PCP office visit: 12/09/2019  No next PCP office visit scheduled at this time    Last prescribed Chantix 1 mg tab: 04/23/2018   Chantix has been discontinued, pulled off market, per [facility]  pharmacy @ [phone]. They are asking if an alternative is ok?      Kenneth Gordon [facility]

## 2020-02-01 NOTE — Telephone Encounter (Signed)
Prior auth team, Is there any other medication like Chantix that pt's insurance will cover. Chantix was pulled off the market.    Jola Babinski [place]

## 2020-02-01 NOTE — Telephone Encounter (Signed)
From: [name]  To: [name]. Maple Hudson, MD  Sent: 02/01/2020 12:18 PM PST  Subject: Chantix Renewal    Hi Dr Maple Hudson    I am having problems getting approval paperwork to the pharmacy for the Chantix replacement drug.     Thanks in advance.     Kenneth Gordon

## 2020-02-02 ENCOUNTER — Other Ambulatory Visit: Payer: Self-pay

## 2020-02-02 ENCOUNTER — Telehealth: Payer: Self-pay | Admitting: PULMONARY DISEASE

## 2020-02-02 DIAGNOSIS — F17200 Nicotine dependence, unspecified, uncomplicated: Secondary | ICD-10-CM

## 2020-02-02 NOTE — Telephone Encounter (Signed)
Dr. Selena Batten,     Pharmacy states Chantix unavailable. The have Varenicline 1 mg in stock. They will need a new order. Let me know if ok and I will call pharmacy.     Edwin Cap, RN

## 2020-02-02 NOTE — Telephone Encounter (Signed)
CONSULT MD: Please advise.

## 2020-02-02 NOTE — Telephone Encounter (Signed)
Here are all the smoking cessation products listed on patient's plan formularies:

## 2020-02-02 NOTE — Telephone Encounter (Signed)
T/c to pharmacy and patient. Patient had stopped Chantix and recently restarted. Verbal order given to pharmacist for Varenicline imported from [place]. There has been a recall of Chantix due to carcinogens.       Edwin Cap, RN

## 2020-02-22 NOTE — Telephone Encounter (Signed)
A user error has taken place: Encounter opened in error, closed for administrative reasons.

## 2020-04-12 ENCOUNTER — Other Ambulatory Visit: Payer: Self-pay | Admitting: Family Medicine

## 2020-04-12 DIAGNOSIS — J42 Unspecified chronic bronchitis: Secondary | ICD-10-CM

## 2020-04-12 MED ORDER — BUDESONIDE-FORMOTEROL HFA 160 MCG-4.5 MCG/ACTUATION AEROSOL INHALER
2.0000 | INHALATION_SPRAY | Freq: Two times a day (BID) | RESPIRATORY_TRACT | 11 refills | Status: DC
Start: 2020-04-12 — End: 2020-04-13

## 2020-04-12 NOTE — Telephone Encounter (Signed)
This is a  REFILL AUTHORIZATION for the faxed request for prescription number [phone].Last date filled 03/14/20.

## 2020-04-13 ENCOUNTER — Encounter: Payer: Self-pay | Admitting: FAMILY PRACTICE

## 2020-04-13 DIAGNOSIS — J42 Unspecified chronic bronchitis: Secondary | ICD-10-CM

## 2020-04-13 MED ORDER — BUDESONIDE-FORMOTEROL HFA 160 MCG-4.5 MCG/ACTUATION AEROSOL INHALER
2.0000 | INHALATION_SPRAY | Freq: Two times a day (BID) | RESPIRATORY_TRACT | 3 refills | Status: DC
Start: 2020-04-13 — End: 2021-01-12

## 2020-04-13 NOTE — Telephone Encounter (Signed)
Pharmacy asking for 90 day supply of Symbicort

## 2020-05-01 ENCOUNTER — Encounter: Payer: Self-pay | Admitting: Family Medicine

## 2020-05-01 ENCOUNTER — Other Ambulatory Visit: Payer: Self-pay | Admitting: Family Medicine

## 2020-05-01 NOTE — Telephone Encounter (Signed)
Last seen: 12/09/19    Last refill: 02/04/2020

## 2020-05-10 IMAGING — CT CT BRAIN WO/W CONTRAST
1 of 2 series · 13 of 30 positions shown, 17 images · IV contrast (ISOVUE 300)
Comparison: Carotid Doppler ultrasound exam 06/16/2017.

HISTORY: Altered mental status.
TECHNIQUE: CT axial images of the brain are obtained prior to and following 100 cc Hsovue-4KK intravenous contrast.  Serum creatinine 0.8 mg/dL.

[Series 2: head w/o soft tissue · axial · non-contrast · 0.49mm/px · z∈[-136,-8]mm · 13 of 38 slices shown, 17 images]
[im 3/38  brain]
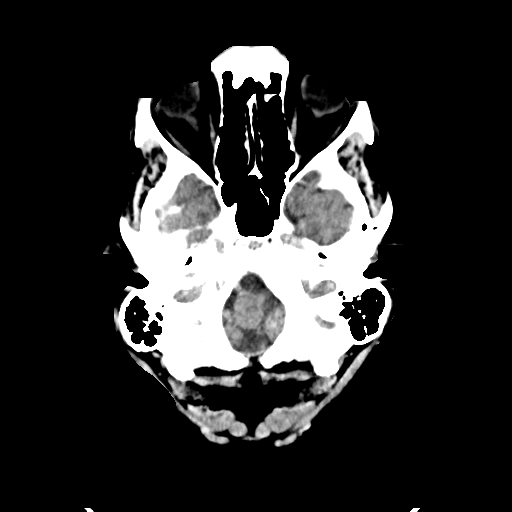
[im 3/38  bone]
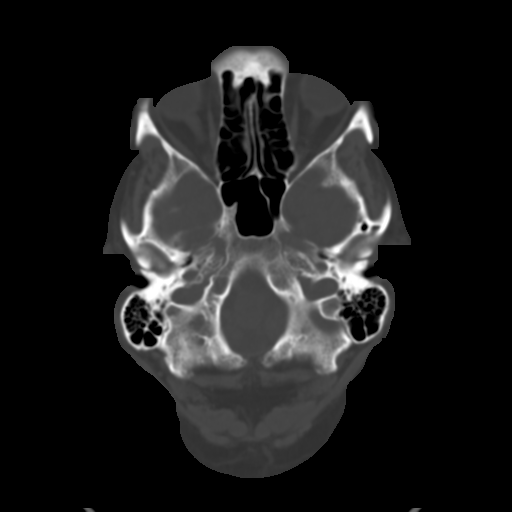
[im 6/38  brain]
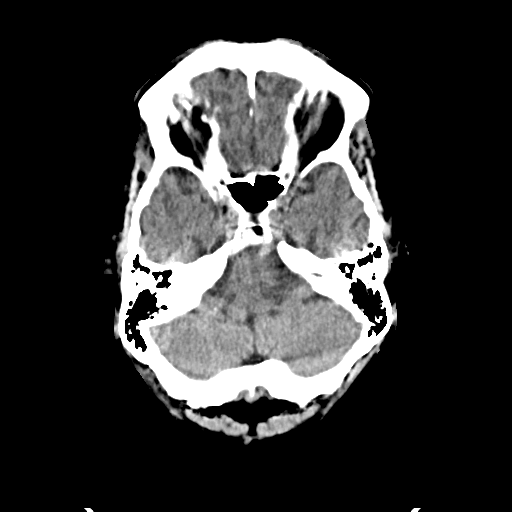
[im 8/38  brain]
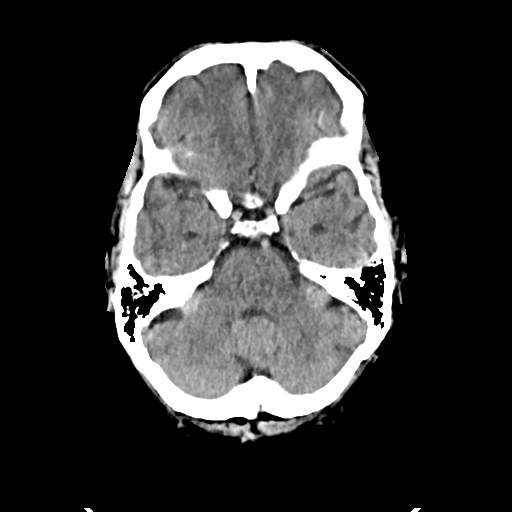
[im 11/38  brain]
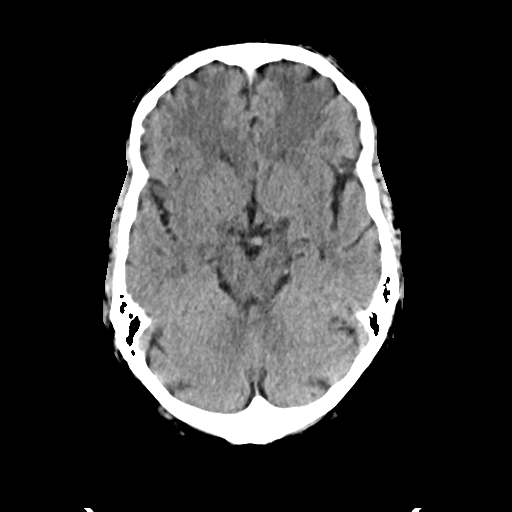
[im 14/38  brain]
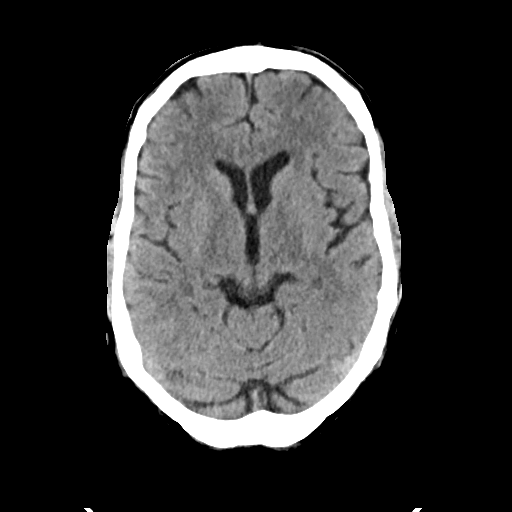
[im 14/38  bone]
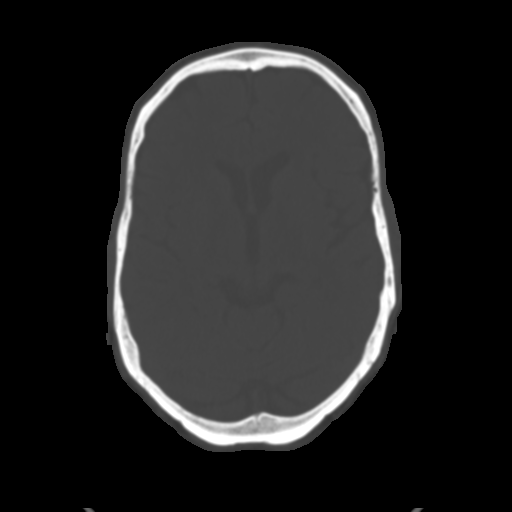
[im 16/38  brain]
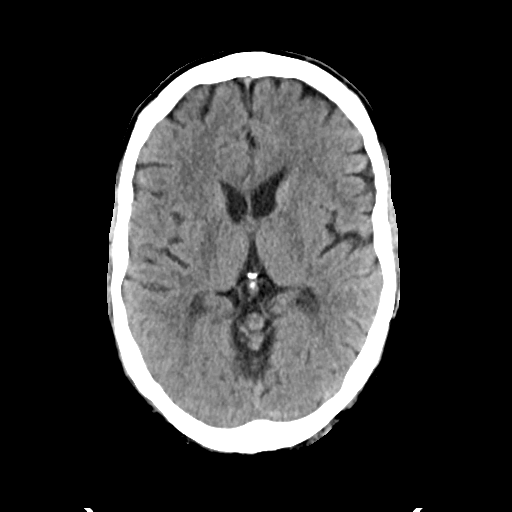
[im 19/38  brain]
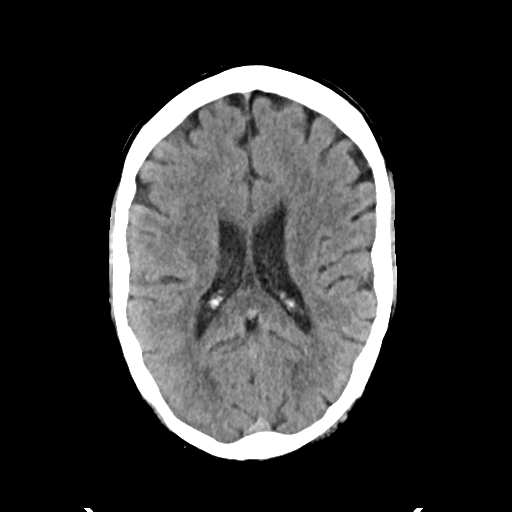
[im 22/38  brain]
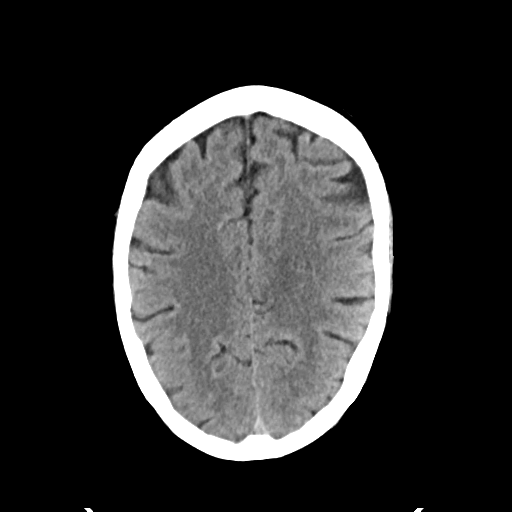
[im 24/38  brain]
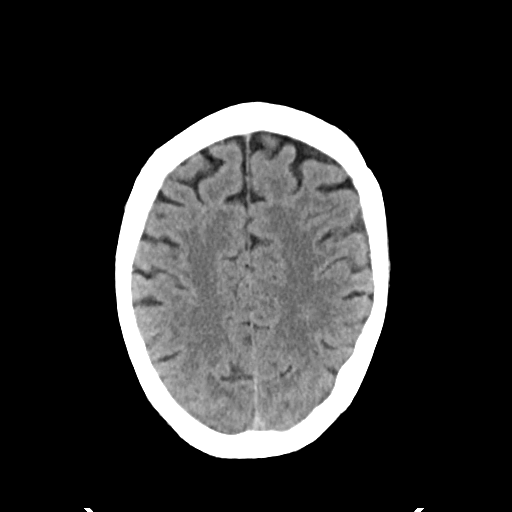
[im 24/38  bone]
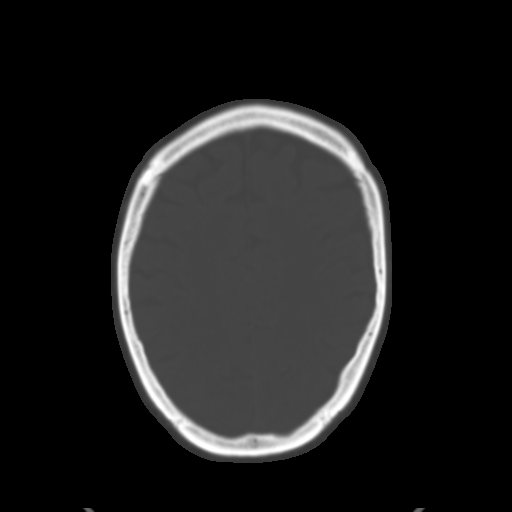
[im 27/38  brain]
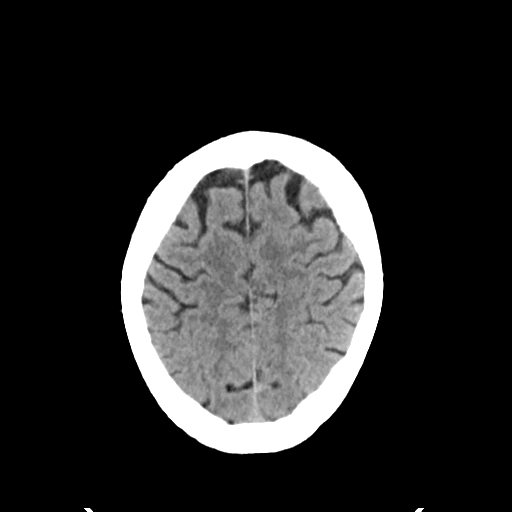
[im 30/38  brain]
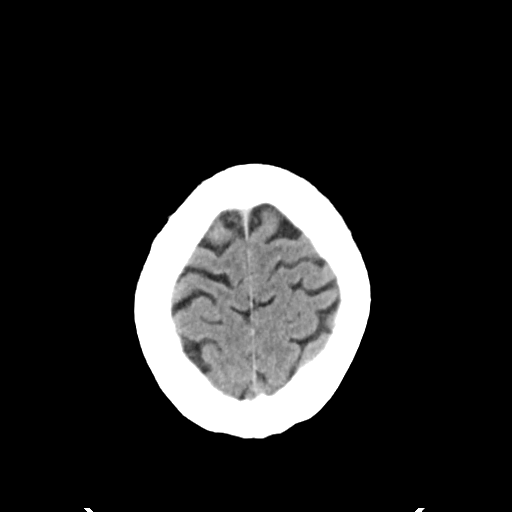
[im 32/38  brain]
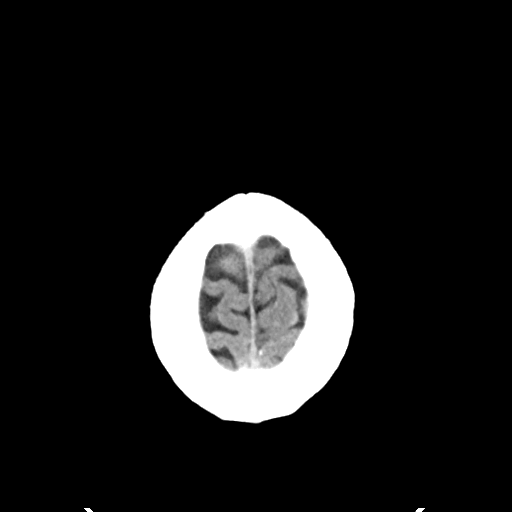
[im 35/38  brain]
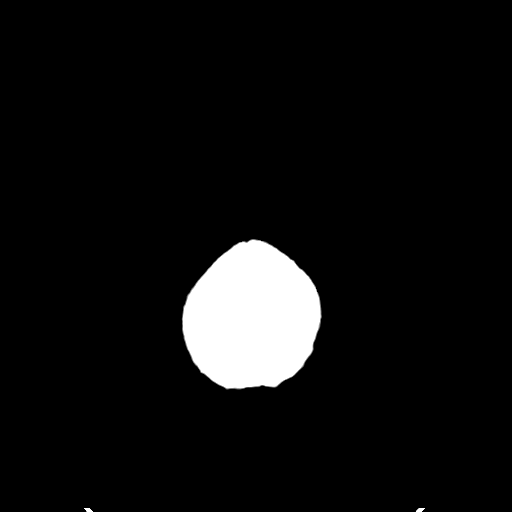
[im 35/38  bone]
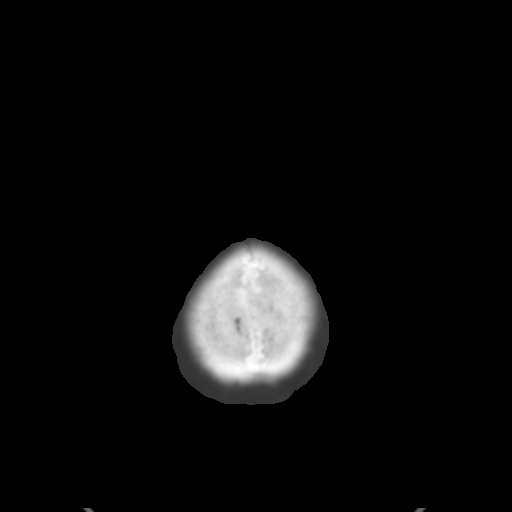

[13 of 30 positions shown; findings below may reference images not displayed]

FINDINGS: There is mild prominence of ventricles and cortical sulci. Subtle periventricular and subcortical hypoattenuation foci seen. There is no mass lesion or midline shift. No extra-axial fluid collection or hematoma seen. No abnormal enhancement identified. Previous bilateral cataract surgeries seen. Mucosal thickening of ethmoid air cells seen. The rest of the orbits, paranasal sinuses, mastoid air cells, and skull are normal.
IMPRESSION: Mild involutional changes with likely mild chronic small vessel ischemic changes identified.

No acute intracranial pathology.

Chronic paranasal sinusitis seen.

Total radiation dose to patient is CTDIvol 122.30 mGy and DLP 2277.28 mGy-cm.

## 2020-05-11 ENCOUNTER — Encounter: Payer: Self-pay | Admitting: Family Medicine

## 2020-05-11 DIAGNOSIS — K219 Gastro-esophageal reflux disease without esophagitis: Secondary | ICD-10-CM

## 2020-05-11 MED ORDER — FAMOTIDINE 40 MG TABLET
40.0000 mg | ORAL_TABLET | Freq: Every day | ORAL | 3 refills | Status: DC
Start: 2020-05-11 — End: 2021-05-31

## 2020-05-11 NOTE — Telephone Encounter (Signed)
From: [name]  To: [name]. Maple Hudson, MD  Sent: 05/11/2020 11:32 AM PST  Subject: Med Renewal    Hi Dr. Maple Hudson    I am trying to refill may FamoTIDine 40 mg Tablet  Commonly known as: PEPCID and there   seems to a problem. This has really saved the day with my acid reflux,    Thanks in advance.    Kenneth Gordon

## 2020-05-11 NOTE — Telephone Encounter (Signed)
Refill request.  Forwarding to Clinical staff to address.  Please pend medication(s) for provider, route to provider and close encounter when completed.      [MA:  Use .MCRXREQUESTGENERAL].

## 2020-05-11 NOTE — Telephone Encounter (Signed)
Patient last office visit on 12/08/2020  Last filled 04/20/2019

## 2020-06-04 ENCOUNTER — Encounter: Payer: Self-pay | Admitting: Family Medicine

## 2020-06-04 DIAGNOSIS — J410 Simple chronic bronchitis: Secondary | ICD-10-CM

## 2020-06-05 MED ORDER — ALBUTEROL SULFATE 1.25 MG/3 ML SOLUTION FOR NEBULIZATION
1.2500 mg | INHALATION_SOLUTION | Freq: Four times a day (QID) | RESPIRATORY_TRACT | 5 refills | Status: DC | PRN
Start: 2020-06-05 — End: 2020-06-05

## 2020-06-05 MED ORDER — ALBUTEROL SULFATE 1.25 MG/3 ML SOLUTION FOR NEBULIZATION
1.2500 mg | INHALATION_SOLUTION | Freq: Four times a day (QID) | RESPIRATORY_TRACT | 5 refills | Status: DC | PRN
Start: 2020-06-05 — End: 2023-12-08

## 2020-06-05 NOTE — Telephone Encounter (Signed)
Albuterol (ACCUNEB) 1.25 mg/3 mL nebulizer solution 75 mL 5 06/05/2020 06/05/2021    Sig - Route: Use 3 mL in nebulizer 4 times daily if needed. - NEBULIZATION    Class: Back Office      rx was not sent to pharmacy.

## 2020-06-05 NOTE — Addendum Note (Signed)
Addended byMilinda Antis on: 06/05/2020 01:14 PM     Modules accepted: Orders

## 2020-06-30 ENCOUNTER — Encounter: Payer: Self-pay | Admitting: Vascular Surgery

## 2020-06-30 DIAGNOSIS — I714 Abdominal aortic aneurysm, without rupture, unspecified: Secondary | ICD-10-CM | POA: Insufficient documentation

## 2020-06-30 NOTE — Addendum Note (Signed)
Addended by: Val Eagle on: 06/30/2020 05:39 PM     Modules accepted: Orders

## 2020-06-30 NOTE — RN Handoff (Signed)
Hello Dr Maple Hudson,    As you may know, the [facility], under the direction of Dr. Donzetta Sprung, has invested in a quality look-back Program, Illuminate, designed to identify aortic aneurysms in radiology reports. We want to inform you that your patient, Kenneth Gordon, has an Abdominal Aortic Aneurysm described from his CT scan 8.28.2018, the maximal aortic diameter was 3.0 cm. Based on this finding, he is due for imaging per [facility] Vascular guidelines. If your patient is being followed for this Abdominal Aortic Aneurysm diagnosis, we apologize. Please also note that we have notified Kenneth Gordon about his diagnosis, our program and educated him/her: him about her aneurysm.     For your convenience, we have included a reference sheet regarding aneurysm surveillance and medical management.    When ordering a radiological study within your clinic domain, please ensure the class is "Internal" and not "RAD." This ensures a referral is generated for the department [facility] Team/Pool, who will then work the referral, contact the patient and have them scheduled in a timely manner.      If you prefer that Vascular Surgery provides surveillance, you may place a referral to the Vascular Center Clinic with Vascular Surgery at [facility].      To ease your busy schedule, I will contact you if your patient deviates from your plan of care or the [facility] Vascular guidelines. We thank you for reading this and please know that we care about your patients as much as you do!     If you have any questions, please feel free to contact us at [phone]. You may also wish to contact the Illuminate AAA Surveillance Program Medical Director, Dr. Donzetta Sprung.       Thank you,  Kenneth Parkinson, RN  Nurse Navigator, [facility]    AAA Surveillance modified from Society for Vascular Surgery (SVS) 2018 AAA Guidelines    SIZE:  FOLLOW UP:    <2.5 cm Within normal limits, no follow up  2.5-2.9 cm Every 5 years*, abdominal  US recommended, CT/MRA acceptable  3.0-3.9 cm Every 3 years. Any AAA > 3.9cm requires Vascular Surgery consultation.  4.0-4.9 cm Every 1-year US/CT/MRI although [place] will be most commonly employed.  5.0 cm+ Every 6 months, typically AAAs >5.4 cm are operated upon    *The SVS recommends every 10 year follow up for patients with AAA 2.5 cm- 2.9 cm. We have recommended surveillance at 5 years to ensure patients do not get "lost in the system," move out of town, forget etc.     Recommended Medical Management for AAAs (2.5 cm and above)  An AAA diagnosis is a CAD equivalent and therefore, secondary risk prevention should be employed:  - Smoking cessation is strongly advised for AAAs, regardless of size, as length of time of smoking is the most highly correlated risk factor for aneurysm formation, enlargement, and rupture.  Strongly encourage smoking cessation, including individual and group counseling and pharmacologic therapy as appropriate  - Strict blood pressure control (target <130/70-80) on all AAA patients   - Statin and low dose aspirin therapies should be initiated

## 2020-07-09 ENCOUNTER — Encounter: Payer: Self-pay | Admitting: Family Medicine

## 2020-07-09 DIAGNOSIS — J42 Unspecified chronic bronchitis: Secondary | ICD-10-CM

## 2020-07-10 MED ORDER — COMBIVENT RESPIMAT 20 MCG-100 MCG/ACTUATION SOLUTION FOR INHALATION
1.0000 | Freq: Four times a day (QID) | RESPIRATORY_TRACT | 3 refills | Status: DC
Start: 2020-07-10 — End: 2021-01-12

## 2020-07-10 NOTE — Telephone Encounter (Signed)
Last seen 12/09/19

## 2020-08-14 NOTE — Telephone Encounter (Signed)
Call to pt who provided identifiers x3, reviewed mychart message which he had not reviewed, provided radiology number for him to call and schedule follow up imaging as ordered by his PCP

## 2020-08-28 ENCOUNTER — Other Ambulatory Visit: Payer: Self-pay | Admitting: Family Medicine

## 2020-08-28 DIAGNOSIS — I724 Aneurysm of artery of lower extremity: Secondary | ICD-10-CM

## 2020-08-28 MED ORDER — ATORVASTATIN 40 MG TABLET
40.0000 mg | ORAL_TABLET | Freq: Every day | ORAL | 1 refills | Status: DC
Start: 2020-08-28 — End: 2021-06-02

## 2020-08-28 NOTE — Telephone Encounter (Signed)
Rx sent, patient also due for follow up in the next 3-4 months. Please inform.

## 2020-08-28 NOTE — Telephone Encounter (Signed)
This is a  REFILL AUTHORIZATION for the faxed request for prescription number [phone].Last date filled 02/27/20.

## 2020-08-28 NOTE — Telephone Encounter (Signed)
Noted pt has not scheduled imaging as ordered by PCP and call w number was provided directly to pt, PCP notified as fyi.

## 2020-08-30 IMAGING — CR CHEST 2 VWS PA LAT
1 series · 2 of 2 positions shown · non-contrast
Comparison: 03/19/2016.

HISTORY: Pneumonia.
TECHNIQUE: Chest x-ray, 2 view.

[Series 1: pa · 0.17mm/px · 2 of 2 slices shown]
[im 1/2]
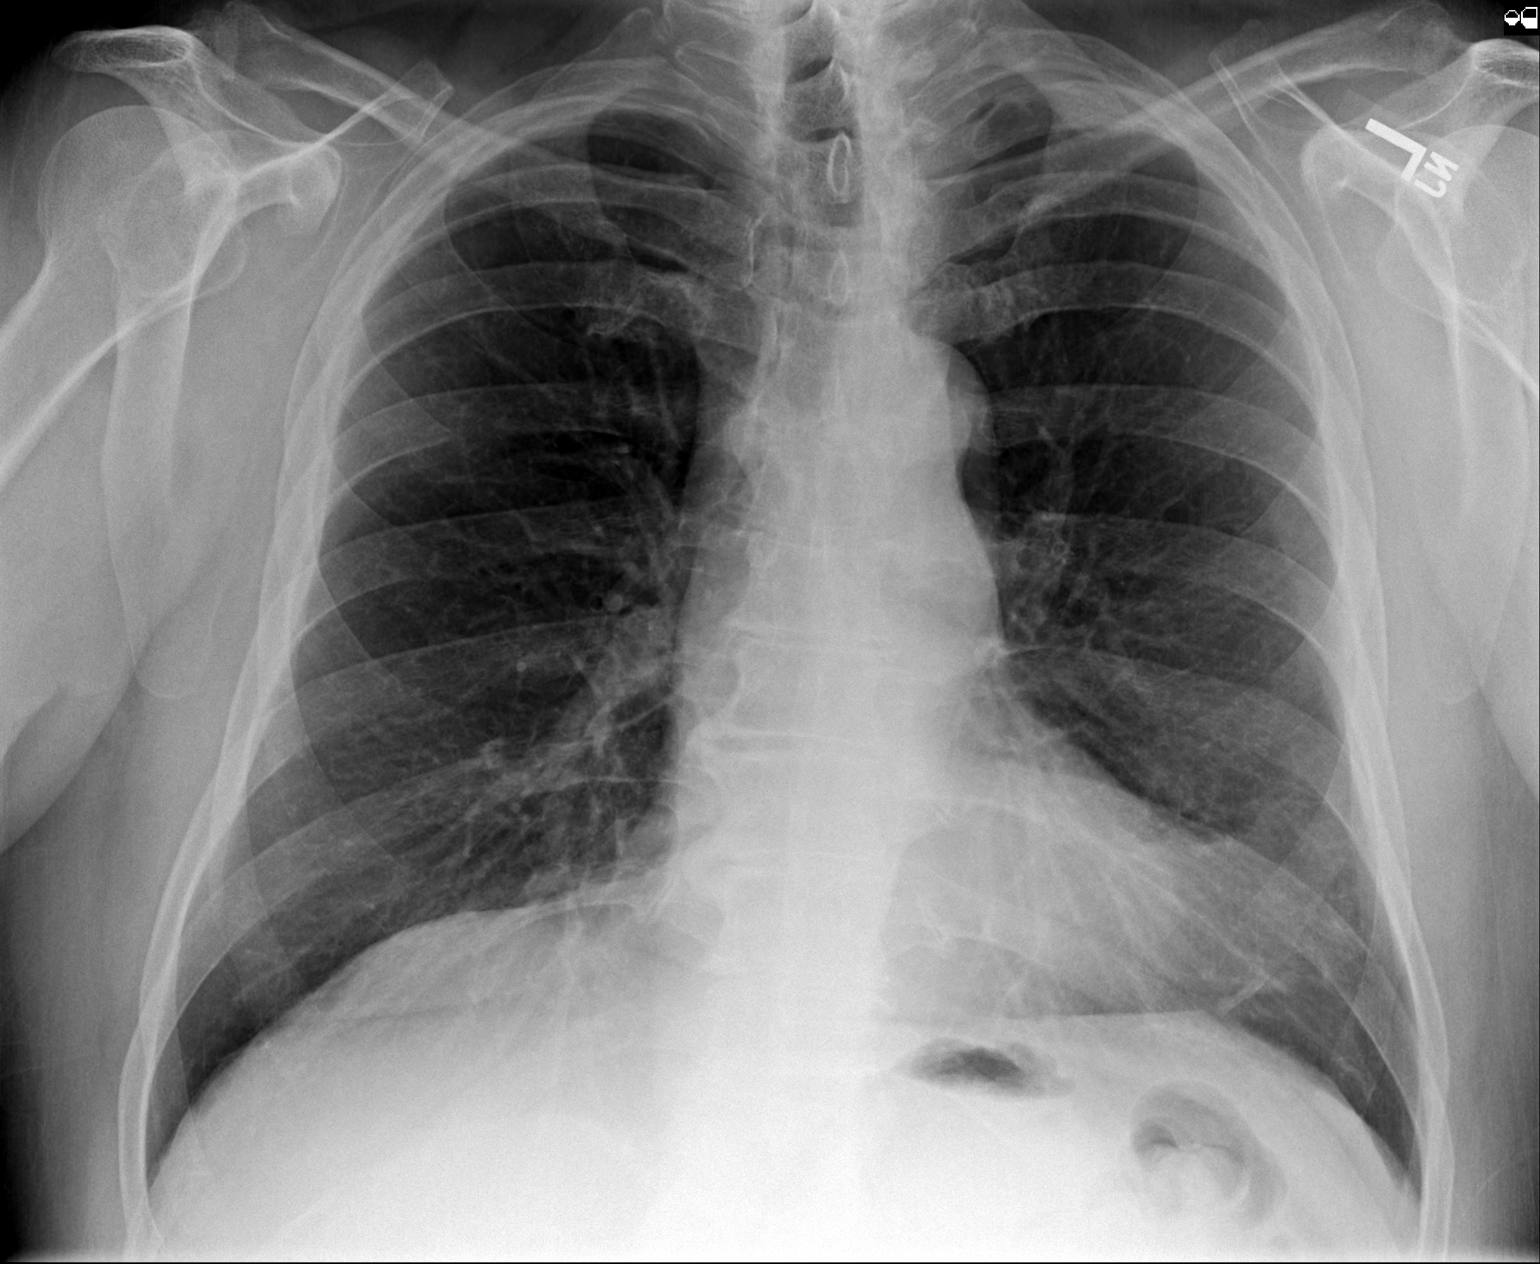
[im 2/2]
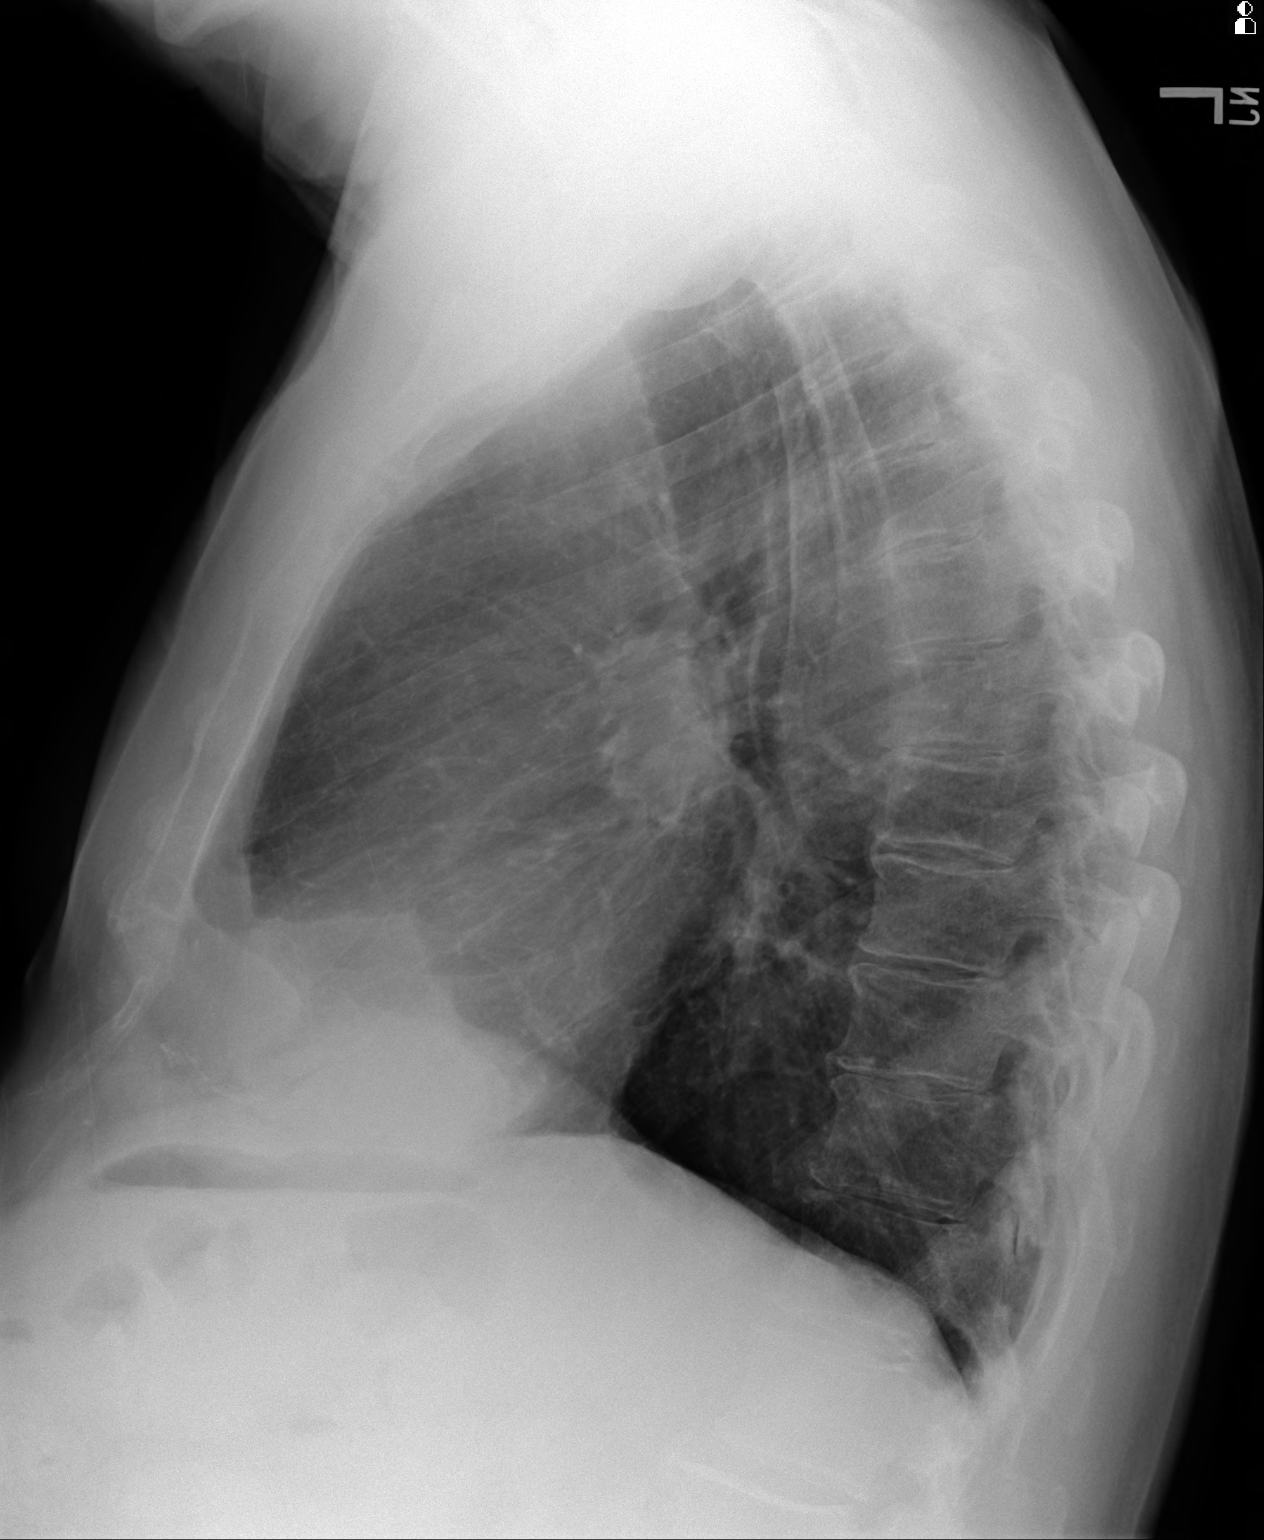

[2 of 2 positions shown; findings below may reference images not displayed]

FINDINGS: Peripheral lungs clear. Heart size, aortic size normal. No pleural effusion, nodules, pneumothorax. No mediastinal widening. No atherosclerosis.
IMPRESSION: Stable chest x-rays. No adverse change, no acute disease seen.

## 2020-08-30 NOTE — Telephone Encounter (Signed)
Patient was notified and scheduled for 9/19.

## 2020-12-11 ENCOUNTER — Ambulatory Visit: Payer: Medicare HMO | Admitting: Family Medicine

## 2020-12-12 ENCOUNTER — Ambulatory Visit: Payer: Medicare HMO

## 2020-12-20 ENCOUNTER — Ambulatory Visit
Admission: RE | Admit: 2020-12-20 | Discharge: 2020-12-20 | Disposition: A | Discharge status: Home / Self Care | Payer: Medicare HMO | Source: Ambulatory Visit | Attending: Vascular Surgery | Admitting: Vascular Surgery

## 2020-12-20 DIAGNOSIS — I714 Abdominal aortic aneurysm, without rupture, unspecified: Secondary | ICD-10-CM

## 2021-01-12 ENCOUNTER — Encounter: Payer: Self-pay | Admitting: Family Medicine

## 2021-01-12 DIAGNOSIS — J42 Unspecified chronic bronchitis: Secondary | ICD-10-CM

## 2021-01-12 MED ORDER — BUDESONIDE-FORMOTEROL HFA 160 MCG-4.5 MCG/ACTUATION AEROSOL INHALER
2.0000 | INHALATION_SPRAY | Freq: Two times a day (BID) | RESPIRATORY_TRACT | 3 refills | Status: DC
Start: 2021-01-12 — End: 2022-02-20

## 2021-01-12 MED ORDER — COMBIVENT RESPIMAT 20 MCG-100 MCG/ACTUATION SOLUTION FOR INHALATION
1.0000 | Freq: Four times a day (QID) | RESPIRATORY_TRACT | 11 refills | Status: DC
Start: 2021-01-12 — End: 2022-03-28

## 2021-01-12 NOTE — Telephone Encounter (Signed)
From: [name]  To: [name], MD  Sent: 01/12/2021 4:13 AM PDT  Subject: Medication Refill    Hi Dr. Maple Hudson    I have run out of both my inhalers (combivent & Symbicort) and [facility] is giving me the run around in refilling them. My normal refill date is the 23rd of the month.     I really don't want to go to emergency room to get them refilled. What can I do???

## 2021-01-12 NOTE — Telephone Encounter (Signed)
Forwarding to Clinical staff to address.  Refill Request      [MA:  Create an Encounter and use .MCMEDREFILL]

## 2021-01-22 ENCOUNTER — Telehealth: Payer: Self-pay | Admitting: Family Medicine

## 2021-01-22 ENCOUNTER — Ambulatory Visit: Payer: Medicare HMO | Admitting: Family Medicine

## 2021-01-22 NOTE — Telephone Encounter (Signed)
Spoke with patient,  appt scheduled 03/05/21.

## 2021-01-22 NOTE — Telephone Encounter (Signed)
Non-Urgent Office Visit:    Requesting an appointment for a follow up, pt had appt scheduled 01/22/21, not able to make it. Requesting to be seen sooner than next available 04/10/21, declined alternate provider. Please assist with scheduling.    [name]  PSR II  [facility] Ext. [phone]    [facility]:  If unable to schedule appointment during the call, add patient to wait list and forward to MA pool].

## 2021-02-09 ENCOUNTER — Encounter: Payer: Self-pay | Admitting: Family Medicine

## 2021-02-09 DIAGNOSIS — J301 Allergic rhinitis due to pollen: Secondary | ICD-10-CM

## 2021-02-09 MED ORDER — FLUTICASONE PROPIONATE 50 MCG/ACTUATION NASAL SPRAY,SUSPENSION: 2.0000 | Freq: Every day | NASAL | 1 refills | Status: DC

## 2021-02-09 NOTE — Telephone Encounter (Signed)
Last seen 12/09/19, has appt 03/05/21

## 2021-02-12 ENCOUNTER — Encounter: Payer: Self-pay | Admitting: Family Medicine

## 2021-02-12 DIAGNOSIS — J42 Unspecified chronic bronchitis: Secondary | ICD-10-CM

## 2021-02-12 MED ORDER — SPIRIVA RESPIMAT 2.5 MCG/ACTUATION SOLUTION FOR INHALATION
2.0000 | Freq: Every day | RESPIRATORY_TRACT | 11 refills | Status: DC
Start: 2021-02-12 — End: 2021-11-27

## 2021-03-01 NOTE — Progress Notes (Deleted)
CDI Query      ADDITIONAL CLARIFICATION REQUESTED  Please indicate if any of the following conditions(s) coexisted and affected patient care, treatment, or management during your face-to-face patient encounter.      Please enter your response(s) in the "ANSWER" space provided below.     CLINICAL FINDINGS:     On 05/05/19 COPD documented. Note states "Severe Obstruction. Last FEV1 24% GOLD Class: D." Can this diagnosis be used for 2022 in visit diagnosis with supporting documentation?       QUESTION:  Can the patient's coexisting condition be clarified or further specified as:    1.  COPD  2.  Other explanation/diagnosis:   (Please document and include support)  3.  Clinically undetermined  4.  Not addressed at today's encounter                 ANSWER:        RISK FACTORS:  smoking exposure     TREATMENT:   Advair     (To close this note, please mark "Accept" then "Sign").               Thank you,      Gardiner Barefoot RN, BSN, MHA, MBA  CPC, CRC, CCDS-O  Outpatient Clinical Documentation Improvement  [facility]   [email or web address]

## 2021-03-05 ENCOUNTER — Ambulatory Visit: Payer: Medicare HMO | Admitting: Family Medicine

## 2021-03-07 ENCOUNTER — Other Ambulatory Visit: Payer: Self-pay

## 2021-03-07 ENCOUNTER — Other Ambulatory Visit: Payer: Self-pay | Admitting: Family Medicine

## 2021-03-07 ENCOUNTER — Telehealth: Payer: Self-pay | Admitting: Family Medicine

## 2021-03-07 DIAGNOSIS — I10 Essential (primary) hypertension: Secondary | ICD-10-CM

## 2021-03-07 MED ORDER — LISINOPRIL 20 MG TABLET
20.0000 mg | ORAL_TABLET | Freq: Every day | ORAL | 3 refills | Status: DC
Start: 2021-03-07 — End: 2021-10-22

## 2021-03-07 NOTE — Telephone Encounter (Signed)
Prior authorization for Spiriva Respimat has been approved by [employer] from 03/25/2020 until 03/24/2021.   The approval letter has been faxed to [facility] [phone]. Our contact information has been included in case billing issues persist.  Patient/guardian may follow up with the pharmacy for prescription status. No further action is required regarding Prior Authorization request. Thank you.

## 2021-03-07 NOTE — Telephone Encounter (Signed)
Spoke with pt, I relayed MD's message, pt understands and has no further questions.

## 2021-03-07 NOTE — Telephone Encounter (Signed)
I am covering Dr. 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 I have reviewed the patient's chart - it looks like he is already on Albuterol/Ipratropium in the form of Combivent Respimat HFA. He has severe COPD (GOLD Class D) and required a step up in therapy to include Spiriva as well since it is long acting while Atrovent/Combivent are short acting.     Kenneth Berthold, MD

## 2021-03-07 NOTE — Telephone Encounter (Signed)
For Spiriva prior authorization, patient's new insurance will require a trial of Atrovent HFA.  Could this be prescribed or ruled out with medical justification?  Please advise.  Thank you.

## 2021-03-07 NOTE — Telephone Encounter (Signed)
Refill authorized, please remind patient to get labs and book physical with PCP.

## 2021-03-07 NOTE — Telephone Encounter (Signed)
Received fax from pharmacy.    Spirivia requires PA.    [phone]  ID# [id number]

## 2021-03-07 NOTE — Telephone Encounter (Signed)
Prior authorization for Spiriva Respimat has been submitted and is currently pending review.  Please refer to patient's Problem List (under Medication Therapy Auth or Encounter for Long-Term [current] Use of Medications) for current status.    Thank you.

## 2021-05-01 NOTE — Progress Notes (Signed)
CDI Query   ADDITIONAL CLARIFICATION REQUESTED  Please indicate if any of the following conditions(s) coexisted and affected patient care, treatment, or management on 05/03/2021 during your face-to-face patient encounter.   Please enter your response(s) in the "ANSWER" space provided below.    CLINICAL FINDINGS:    1.  COPD exacerbation assigned at visit 12/09/19. PFT results from 10/06/17 reveal severe COPD. remains on Spiriva, Combivent and symbicort, if still relevant, can this diagnosis be addressed at next visit?  QUESTION:  Can the patient's coexisting condition be clarified or further specified as:    1.  Chronic obstructive pulmonary disease, unspecified    2.  Other explanation/diagnosis:   (Please document and include support)  3.  Clinically undetermined  4.  Not addressed at today's encounter                ANSWER:  1     RISK FACTORS:   76 years old, smoker    TREATMENT:  Spiriva, Combivent and symbicort    (To close this note, please mark "Accept" then "Sign").    Respectfully,    Mandolin Falwell BSN, MHA, CRC, CDEO, [place]   Clinical Documentation Integrity: Outpatient  Health Information Management (HIM) Division Patient [place] [facility]  [email or web address]  C. [phone]

## 2021-05-03 ENCOUNTER — Ambulatory Visit: Payer: Medicare HMO | Admitting: Family Medicine

## 2021-05-03 ENCOUNTER — Telehealth: Payer: Self-pay | Admitting: Family Medicine

## 2021-05-03 DIAGNOSIS — J449 Chronic obstructive pulmonary disease, unspecified: Secondary | ICD-10-CM

## 2021-05-03 DIAGNOSIS — I10 Essential (primary) hypertension: Secondary | ICD-10-CM

## 2021-05-03 DIAGNOSIS — J418 Mixed simple and mucopurulent chronic bronchitis: Secondary | ICD-10-CM

## 2021-05-03 DIAGNOSIS — E782 Mixed hyperlipidemia: Secondary | ICD-10-CM

## 2021-05-03 NOTE — Telephone Encounter (Signed)
Kenneth Gordon is a 76yr old male  3 patient identifiers used  Patient states he changed the appointment to VV.  Charlett Nose, RN,   PCN Triage

## 2021-05-03 NOTE — Progress Notes (Unsigned)
Video Visit Note     I performed this clinical encounter by utilizing a real time telehealth video connection between my location and the patient's location. The patient's location was confirmed during this visit. I obtained verbal consent from the patient to perform this clinical encounter utilizing video and prepared the patient by answering any questions they had about the telehealth interaction.    Chief Complaint: Follow-up blood pressure, COPD    SUBJECTIVE:  Kenneth Gordon is a 63yr male here for follow-up of his ongoing medical issues.    Last visit with me was September 2021 through video    He has overall been doing well.  Today was supposed to be an in person visit, but became fatigued while walking to his car and noticed heavy breathing.  He had no chest pain.  Went back inside to rest and felt much better.  Feeling back to normal now.  Switched visit to video visit.  No cough, wheezing.  He quit smoking over a year ago and has been overall feeling much better.  Does report he has not left his apartment in some time, has not been exercising or walking.  Vital signs are O2 sat 95-96%, pulse 78.  Heart rate was much faster when exerting himself.  Continues on his same inhalers for COPD.    Blood pressures are generally normal, range from the [id number] to [id number] systolic.  Will occasionally get higher numbers.  He is on lisinopril.    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    ASSESSMENT/PLAN:  Diagnoses and all orders for this visit:    Chronic obstructive pulmonary disease, unspecified COPD type ([place]) -appears stable at this time, congratulated him for stopping smoking.  He is feeling back to normal after exerting himself this afternoon.  He had no chest pain or shortness of breath, but felt tired.  May be element of deconditioning as he has not been exercising, walking or leaving his apartment.  I would like to see him in person at some point for further evaluation.  If happens again or if he develops any chest pain, shortness of breath recommend immediate evaluation either in office or in ER.  Continue current inhalers  -     CBC No Differential; Future    Essential hypertension -blood pressures appear overall controlled, continue to monitor, continue lisinopril  -     CBC No Differential; Future  -     Comprehensive Metabolic Panel; Future    Mixed hyperlipidemia -update lipid panel, continue statin  -     CBC No Differential; Future  -     Comprehensive Metabolic Panel; Future  -     Lipid Panel with DLDL Reflex; Future    Barriers to Learning: none  Patient/Family Understanding: verbalizes    Follow up as soon  as possible in person    Electronically signed by  Kandace Parkins, MD  [place]     This note was created using the support of [place]. Please note any grammatical, sound-alike, or syntax errors as likely dictation errors.       To the patient: If you are reviewing this progress note and have questions about the meaning or medical terms being used, please schedule an appointment or bring it up at your next follow-up appointment. Medical notes are meant to be a communication tool between medical professionals and require medical terms to be used for efficiency. Thank you.

## 2021-05-03 NOTE — Telephone Encounter (Signed)
Emergency Advice / Priscille Loveless:    Reason for Call:  Patient experiencing symptoms of trouble breath.  BATLINE CALL transferred to Nursing Triage back line.        Gust Brooms, [facility]'    [facility]:  Please send Emergency message High priority to P PCN NURSE TRIAGE [[id number]], warm transfer call to advice nurse backline Ext: [phone]].

## 2021-05-03 NOTE — Telephone Encounter (Signed)
OK for video/TE if needed

## 2021-05-03 NOTE — Telephone Encounter (Signed)
Kenneth Gordon is a 76yr old male  3 patient identifiers used.  Per:   patient.    Disposition: Consult with MD  Patient is trying to get someone to bring him to his appointment. May need to change to a VV or TV.  Per:   patient verbalizes agreement to plan. Agrees to callback with any increase in symptoms/concerns or questions.    See Assessment Below  ClearTriage Note: patient states that he tried to walk to his car and started breathing heavy. he reports that his O2 sat was 96%, but blood pressure was 174/90, which is very high for him and pulse was 110.  he rested and rechecked it 147/87 and pulse 90's. He states that he is unable to make it to his appointment because of how he is feeling. He is going to call son to see if he can take him to the appointment. Informed him that he needs to be seen to check out why his heart rate is so fast and because of the new onset of DOE. He verbalized understanding and agreed.    Protocol Used: Breathing Difficulty (Adult)  Protocol-Based Disposition: See HCP Within 4 Hours (or PCP Triage or Video Visit)  Override (Final) Disposition: Consult MD  Override Reason: Transportation not available    Video visit offer not recorded    Positive Triage Question:  * [1] MILD difficulty breathing (e.g., minimal/no SOB at rest, SOB with walking, pulse < 100) AND [2] NEW-onset or WORSE than normal    Care Advice Discussed:  * Reasons To Call Back      - You become worse    Negative Triage Questions:  * SEVERE difficulty breathing (e.g., struggling for each breath, speaks in single words)  * [1] Breathing stopped AND [2] hasn't returned  * Choking on something  * Bluish (or gray) lips or face now  * Difficult to awaken or acting confused (e.g., disoriented, slurred speech)  * Passed out (i.e., lost consciousness, collapsed and was not responding)  * Wheezing started suddenly after medicine, an allergic food or bee sting  * Stridor  * Slow, shallow and weak breathing  * Sounds like a  life-threatening emergency to the triager  * [1] MODERATE difficulty breathing (e.g., speaks in phrases, SOB even at rest, pulse 100-120) AND [2] NEW-onset or WORSE than normal  * Oxygen level (e.g., pulse oximetry) 90 percent or lower  * Wheezing can be heard across the room  * Drooling or spitting out saliva (because can't swallow)  * History of prior "blood clot" in leg or lungs (i.e., deep vein thrombosis, pulmonary embolism)  * History of inherited increased risk of blood clots (e.g., Factor 5 Leiden, Anti-thrombin 3, Protein C or Protein S deficiency, Prothrombin mutation)  * Major surgery in the past month  * Hip or leg fracture (broken bone) in past month (or had cast on leg or ankle in past month)  * Illness requiring prolonged bedrest in past month (e.g., immobilization, long hospital stay)  * Long-distance travel in past month (e.g., car, bus, train, plane; with trip lasting 6 or more hours)  * Cancer treatment in past six months (or has cancer now)  * Extra heart beats OR irregular heart beating  (i.e., "palpitations")  * Fever > 103 F (39.4 C)  * [1] Fever > 101 F (38.3 C) AND [2] age > 60 years  * [1] Fever > 100.0 F (37.8 C) AND [2] bedridden (e.g., nursing home patient,  CVA, chronic illness, recovering from surgery)  * [1] Fever > 100.0 F (37.8 C) AND [2] diabetes mellitus or weak immune system (e.g., HIV positive, cancer chemo, splenectomy, organ transplant, chronic steroids)  * [1] Periods where breathing stops and then resumes normally AND [2] bedridden (e.g., nursing home patient, CVA)  * Pregnant or postpartum (from 0 to 6 weeks after delivery)  * Patient sounds very sick or weak to the triager    Charlett Nose, RN,   PCN Triage

## 2021-05-27 ENCOUNTER — Other Ambulatory Visit: Payer: Self-pay | Admitting: Family Medicine

## 2021-05-27 DIAGNOSIS — J301 Allergic rhinitis due to pollen: Secondary | ICD-10-CM

## 2021-05-28 NOTE — Telephone Encounter (Signed)
Last refill: 05/16/2021    Last seen 05/03/21

## 2021-05-31 ENCOUNTER — Other Ambulatory Visit: Payer: Self-pay | Admitting: Family Medicine

## 2021-05-31 DIAGNOSIS — K219 Gastro-esophageal reflux disease without esophagitis: Secondary | ICD-10-CM

## 2021-05-31 NOTE — Telephone Encounter (Signed)
[place] request     Last fill date 03/02/2021.   Last video visit: 05/03/21

## 2021-06-02 ENCOUNTER — Encounter: Payer: Self-pay | Admitting: Family Medicine

## 2021-06-02 DIAGNOSIS — I724 Aneurysm of artery of lower extremity: Secondary | ICD-10-CM

## 2021-06-04 MED ORDER — ATORVASTATIN 40 MG TABLET
40.0000 mg | ORAL_TABLET | Freq: Every day | ORAL | 3 refills | Status: DC
Start: 2021-06-04 — End: 2021-08-14

## 2021-06-04 NOTE — Telephone Encounter (Signed)
Last seen 05/03/21

## 2021-08-10 ENCOUNTER — Encounter: Payer: Self-pay | Admitting: Family Medicine

## 2021-08-10 NOTE — Telephone Encounter (Signed)
From: [name]  To: [name], MD  Sent: 08/09/2021 12:54 PM PDT  Subject: bloating    Hi Dr. Maple Hudson    My stomach is really bloated now and isn't going down. It isa making it difficult to fasten my [employer]. Any suggestions.    Kenneth Gordon

## 2021-08-10 NOTE — Telephone Encounter (Signed)
Appointment recommended

## 2021-08-14 ENCOUNTER — Other Ambulatory Visit: Payer: Self-pay | Admitting: Family Medicine

## 2021-08-14 DIAGNOSIS — I724 Aneurysm of artery of lower extremity: Secondary | ICD-10-CM

## 2021-08-14 MED ORDER — ATORVASTATIN 40 MG TABLET
40.0000 mg | ORAL_TABLET | Freq: Every day | ORAL | 3 refills | Status: DC
Start: 2021-08-14 — End: 2022-10-03

## 2021-08-14 NOTE — Telephone Encounter (Signed)
Fax message from [facility] below    100- Day Rx request to improve adherence: for medication Astrovastin 40 mg tab

## 2021-08-15 ENCOUNTER — Encounter: Payer: Self-pay | Admitting: Family Medicine

## 2021-08-15 NOTE — Telephone Encounter (Signed)
From: Kenneth Gordon  To: [name], MD  Sent: 08/14/2021 11:21 PM PDT  Subject: Change TO Prescription    I got a call From [facility] and wanted to confirm my home address and wanted to increase my Atorvastatin 40mg  refills to 100 Tablets. Something didn't sound right about this call so I am checking with you to see if they followed up with you.    Thanks in advance

## 2021-08-15 NOTE — Telephone Encounter (Signed)
Confirmed prescription for Atorvastatin 40mg 

## 2021-09-03 ENCOUNTER — Telehealth: Payer: Self-pay | Admitting: Family Medicine

## 2021-09-03 NOTE — Telephone Encounter (Signed)
[facility]: If patient returns call please transfer them to: [phone]    I have attempted to contact this patient by phone with the following results: Telephone call to patient successful: Three patient identifiers used.    Patient has not had their blood pressure checked in over 12 months: Nurse visit has been scheduled for BP check.     Kenneth Boston, MA

## 2021-09-17 ENCOUNTER — Ambulatory Visit: Payer: Medicare HMO

## 2021-09-17 VITALS — BP 147/70 | HR 64 | Resp 20

## 2021-09-17 DIAGNOSIS — I10 Essential (primary) hypertension: Secondary | ICD-10-CM

## 2021-09-17 DIAGNOSIS — Z23 Encounter for immunization: Secondary | ICD-10-CM

## 2021-09-17 NOTE — Patient Instructions (Signed)
COVID-19 VACCINE      Pfizer Vaccine  View the Fact Sheet for Recipients and Caregivers at   https://www.fda.gov/media/167212/download  Moderna Vaccine  View the Fact Sheet for Recipients and Caregivers at   https://www.fda.gov/media/167209/download  Novavax Vaccine  View the Fact Sheet for Recipients and Caregivers at   https://www.fda.gov/media/159898/download    You may experience some side effects which are normal signs that your immune system is working to protect you. Side effects tend to be mild and temporary and may vary for different ages.     Common Side Effects  Pain, swelling, redness where the shot was given  Fever  Chills  Tiredness  Headache  Muscle pain  Nausea    Helpful Tips  You can apply a clean, cool, wet cloth where you received the shot to relieve pain or swelling. You may use or exercise your arm.  To reduce discomfort from fever, drink plenty of fluids and dress lightly. Get plenty of rest.  Talk to your doctor about taking over the counter medications like ibuprofen or acetaminophen to reduce pain or discomfort.    Contact your primary care physician  If the redness or tenderness at the site of the shot increases after 24 hours  If your side effects worry you or do not go away after a few days    Seek emergency care  If you think you might be having a severe allergic reaction  If you develop chest pain, shortness of breath, or feelings of having a fast beating, fluttering, or pounding heart  If you develop signs of bleeding or blood clots, including shortness of breath, chest pain, leg swelling, persistent abdominal pain, severe or persistent headaches or blurred vision, or easy bruising or tiny blood spots under your skin  If you develop weakness and tingling in your arms and/or legs    Reminder: It may take about 2 weeks for your body to build protection after any vaccine

## 2021-09-17 NOTE — Progress Notes (Signed)
COVID-19 Vaccine Documentation:  The patient is eligible to receive the COVID-19 Vaccine at this time:Yes    Fact Sheet for Recipients and Caregivers given to the patient/caregiver for review. All patient/caregiver questions were answered and the patient/caregiver consents to the COVID-19 vaccine being given today.     Vaccine prepared and administered according to the current Emergency Use Authorization and Fact Sheet for Healthcare Providers Administering Vaccine.    Patient offered vaccine card and given discharge instructions.      COVID-19 Vaccine Observation:  The patient was observed for 15 minutes for immediate reactions to the vaccine per protocol. No reaction was observed.    Patient scheduled on nurse appointment schedule today for a blood pressure check due to Annual Bp check .    Blood pressure reading today was:   Vitals:    09/17/21 1137 09/17/21 1201   BP: (!) 147/83 (!) 147/70   Pulse: 91 64   Resp: 20 20   SpO2: 95% 96%        Patient is currently taking all medications as prescribed.     Patient reports SOB with exertion, no chest pain, no dizziness, no headaches, no visual changes, no epigastric pain. No other symptoms to report per patient.    Fuller Plan, LVN

## 2021-09-19 ENCOUNTER — Telehealth: Payer: Self-pay | Admitting: Family Medicine

## 2021-09-19 NOTE — Telephone Encounter (Signed)
Blood pressure high at recent RN visit. I would like him to follow up with me in office to discuss and initiate/change management. OK for same day in the next 2-3 weeks.

## 2021-09-19 NOTE — Telephone Encounter (Signed)
3 identifiers used - Called patient relayed message from MD. Appointment scheduled for 10/22/2021 @10 :40am per patients request.

## 2021-10-22 ENCOUNTER — Ambulatory Visit (INDEPENDENT_AMBULATORY_CARE_PROVIDER_SITE_OTHER): Payer: Medicare HMO | Admitting: Family Medicine

## 2021-10-22 ENCOUNTER — Encounter: Payer: Self-pay | Admitting: Family Medicine

## 2021-10-22 ENCOUNTER — Ambulatory Visit (INDEPENDENT_AMBULATORY_CARE_PROVIDER_SITE_OTHER): Payer: Medicare HMO

## 2021-10-22 ENCOUNTER — Ambulatory Visit
Admission: RE | Admit: 2021-10-22 | Discharge: 2021-10-22 | Disposition: A | Discharge status: Home / Self Care | Payer: Medicare HMO | Source: Ambulatory Visit | Attending: Family Medicine | Admitting: Family Medicine

## 2021-10-22 VITALS — BP 137/78 | HR 98 | Temp 98.4°F | Resp 16 | Ht 74.0 in | Wt 196.2 lb

## 2021-10-22 DIAGNOSIS — L409 Psoriasis, unspecified: Secondary | ICD-10-CM

## 2021-10-22 DIAGNOSIS — J449 Chronic obstructive pulmonary disease, unspecified: Secondary | ICD-10-CM

## 2021-10-22 DIAGNOSIS — R140 Abdominal distension (gaseous): Secondary | ICD-10-CM | POA: Insufficient documentation

## 2021-10-22 DIAGNOSIS — I10 Essential (primary) hypertension: Secondary | ICD-10-CM | POA: Insufficient documentation

## 2021-10-22 DIAGNOSIS — Z1211 Encounter for screening for malignant neoplasm of colon: Secondary | ICD-10-CM

## 2021-10-22 DIAGNOSIS — E782 Mixed hyperlipidemia: Secondary | ICD-10-CM

## 2021-10-22 LAB — COMPREHENSIVE METABOLIC PANEL
Alanine Transferase (ALT): 21 U/L (ref <=41)
Albumin: 4.3 g/dL (ref 4.0–4.9)
Alkaline Phosphatase (ALP): 83 U/L (ref 35–129)
Anion Gap: 8 mmol/L (ref 7–15)
Aspartate Transaminase (AST): 24 U/L (ref <=41)
Bilirubin Total: 0.6 mg/dL (ref <=1.2)
Calcium: 9.6 mg/dL (ref 8.6–10.0)
Carbon Dioxide Total: 32 mmol/L — ABNORMAL HIGH (ref 22–29)
Chloride: 99 mmol/L (ref 98–107)
Creatinine Serum: 1 mg/dL (ref 0.51–1.17)
E-GFR Creatinine (Male): 78 mL/min/{1.73_m2}
Glucose: 101 mg/dL (ref 74–109)
Potassium: 4.2 mmol/L (ref 3.4–5.1)
Protein: 7 g/dL (ref 6.6–8.7)
Sodium: 139 mmol/L (ref 136–145)
Urea Nitrogen, Blood (BUN): 10 mg/dL (ref 6–20)

## 2021-10-22 LAB — CBC NO DIFFERENTIAL
Hematocrit: 43.5 % (ref 41.0–53.0)
Hemoglobin: 14.5 g/dL (ref 13.5–17.5)
MCH: 31.2 pg (ref 27.0–33.0)
MCHC: 33.2 % (ref 32.0–36.0)
MCV: 93.9 fL (ref 80.0–100.0)
MPV: 7.1 fL (ref 6.8–10.0)
Platelet Count: 215 10*3/uL (ref 130–400)
RDW: 13.8 % (ref 0.0–14.7)
Red Blood Cell Count: 4.63 10*6/uL (ref 4.50–5.90)
White Blood Cell Count: 7 10*3/uL (ref 4.5–11.0)

## 2021-10-22 LAB — LIPID PANEL WITH DLDL REFLEX
Cholesterol: 128 mg/dL (ref <200)
HDL Cholesterol: 54 mg/dL (ref >40)
LDL Cholesterol Calculation: 61 mg/dL (ref <100)
Non-HDL Cholesterol: 74 mg/dL (ref <=150)
Total Cholesterol: HDL Ratio: 2.4 (ref <4.0)
Triglyceride: 64 mg/dL (ref <150)

## 2021-10-22 MED ORDER — LISINOPRIL 30 MG TABLET
30.0000 mg | ORAL_TABLET | Freq: Every day | ORAL | 3 refills | Status: DC
Start: 2021-10-22 — End: 2022-11-09

## 2021-10-22 NOTE — Progress Notes (Unsigned)
Appropriate PPE was worn by myself and the patient during this encounter.     Chief Complaint: Follow-up blood pressures, other medical issues  SUBJECTIVE:  Kenneth Gordon is a 10yr male here for follow-up of his blood pressures and other medical issues today.    He is overall doing well.  Blood pressures are generally controlled at home, though can range higher at times.  Pressures are typically 135-150 systolic.  She continues on lisinopril 20 mg daily.  Tolerates well without side effects.  Was seen for nurses visit recently and was noted to have higher blood pressures.    Also history of COPD, previously followed pulmonology.  He has since quit smoking for the past year.  He is compliant with inhalers.  He does well at rest, but has continued shortness of breath with exertion.  He does monitor his oxygen saturation which is typically in the mid 90s.    He has history of psoriasis, has not seen dermatology in some time.    Reports some abdominal bloating which has been ongoing for the past few years.  Not really painful, but can be bothersome.  No blood in the stool.  He is due for colon cancer screening as well.  He does not want a colonoscopy.    OBJECTIVE:  BP 137/78 (SITE: left arm, Orthostatic Position: sitting, Cuff Size: regular)   Pulse 98   Temp 36.9 C (98.4 F) (Temporal)   Resp 16   Ht 1.88 m (6\' 2" )   Wt 89 kg (196 lb 3.4 oz)   SpO2 97%   BMI 25.19 kg/m  Body mass index is 25.19 kg/m.  General Appearance: alert, no distress, pleasant affect  Neck:  Neck supple. No adenopathy  Heart:  normal rate and regular rhythm  Lungs: Diminished breath sounds, otherwise clear to auscultation.  Abdomen: Mild distention noted, no pain to palpation  Extremities:  no cyanosis or edema.    ASSESSMENT/PLAN:  Kenneth Gordon was seen today for blood pressure.    Diagnoses and all orders for this visit:    Primary hypertension -blood pressures are borderline here, borderline at home as well.  Increase lisinopril to 30  mg daily.  Repeat metabolic panel has already been ordered and he will complete today.  -     Lisinopril (PRINIVIL, ZESTRIL) 30 mg tablet; Take 1 tablet by mouth every day.    Abdominal bloating -persistent over the past few years, not painful, but can be bothersome. FODMAP diet.  Will obtain ultrasound for further evaluation.  Declines colonoscopy  -     Kenneth Gordon ABDOMEN, COMPLETE; Future    Chronic obstructive pulmonary disease, unspecified COPD type (HCC) -persistent daily symptoms.  Does well at rest, but has dyspnea on exertion which does limit his daily activities.  We will update a chest x-ray today.  Previous patient of [facility] pulmonology.  He has since quit smoking.  In the last pulmonology note further treatments were discussed once he stopped smoking.  New referral placed to reestablish care.  Continue current inhalers.  We also discussed lung cancer screening with CT.  He would like to stick with a chest x-ray for now.  -     DX CHEST 2 VIEWS; Future  -     Pulmonary Referral    Psoriasis -new dermatology referral placed.  -     Dermatology Clinic Referral    Colon cancer screening  -     COLOGUARD; Future  -  COLOGUARD    Barriers to Learning: none  Patient/Family Understanding: verbalizes    Follow up 2-3 months or prn    Electronically signed by  [name], MD  [facility]     This note was created using the support of [employer]. Please note any grammatical, sound-alike, or syntax errors as likely dictation errors.       To the patient: If you are reviewing this progress note and have questions about the meaning or medical terms being used, please schedule an appointment or bring it up at your next follow-up appointment. Medical notes are meant to be a communication tool between medical professionals and require medical terms to be used for efficiency. Thank you.

## 2021-10-22 NOTE — Nursing Note (Signed)
Patient roomed, chief complaint noted, allergies verified, blood pressure, pulse,  temperature, and weight obtained, screened for pain. Pharmacy and tobacco history verified .

## 2021-11-01 ENCOUNTER — Ambulatory Visit: Payer: Medicare HMO

## 2021-11-27 ENCOUNTER — Other Ambulatory Visit: Payer: Self-pay | Admitting: Family Medicine

## 2021-11-27 DIAGNOSIS — J42 Unspecified chronic bronchitis: Secondary | ICD-10-CM

## 2021-11-27 MED ORDER — SPIRIVA RESPIMAT 2.5 MCG/ACTUATION SOLUTION FOR INHALATION
2.0000 | Freq: Every day | RESPIRATORY_TRACT | 11 refills | Status: DC
Start: 2021-11-27 — End: 2022-10-06

## 2021-11-27 NOTE — Telephone Encounter (Signed)
This is a  REFILL AUTHORIZATION for the faxed request for prescription number [phone].Last date filled 10/31/21.    Recent Visits  Date Type Provider Dept   10/22/21 Office Visit Belva Chimes, MD [facility]   05/03/21 Telemedicine Scheduled Young, Zola Button, MD [facility]   Showing recent visits within past 540 days with a meds authorizing provider and meeting all other requirements  Future Appointments  No visits were found meeting these conditions.  Showing future appointments within next 150 days with a meds authorizing provider and meeting all other requirements

## 2021-11-28 NOTE — Nursing Note (Signed)
ORTHOPAEDIC SURGERY CONSULT/H&P NOTE:     Name: Kenneth Gordon  MRN: [id number]  Age/Sex: 17yrfemale    DOB: [birth year] Date: 11/28/2021  Room: ED RESUS 1/R01    Primary Attending: HAdan Sis*  Time consult received: 19:20  Time consult seen: 19:35     Reason for consult: right acetabulum with femoral head erosion    HPI:   76year old male with history of chronic opioid use disorder (on buprenorphine), recent revision T10 to pelvis posterior spinal instrumentation and fusion (Dr. PElenore Paddy 11/07/2021), previous left total hip arthroplasty complicated by greater trochanteric periprosthetic fracture status post ORIF who presented as transfer from outside emergency department accepted by general surgery trauma team with primary subjective complaint of right hip and groin pain since a fall over a week ago.     Patient reports sustaining a fall over a week ago (11/17/2021).  She was seen in outside emergency department on 8/28 and per outside records, radiographs at that time demonstrated a reportedly isolated right inferior pubic rami fracture.  She was discharged with a walker with instructions to follow-up with orthopedics.  She reports after leaving emergency department, she was never able to establish orthopedic care until yesterday when she was seen at outside orthopedic provider yesterday.  They were concerned given her right lower extremity swelling for possible DVT and referred her back to the emergency department.  She was seen at outside emergency department yesterday and radiographs at that time demonstrated the displaced acetabulum fracture and femoral head erosion. [facility]     Additionally, outside DVT ultrasound of the bilateral lower extremities was reportedly negative.    She currently denies any numbness or tingling in the right lower extremity.      Immunization History   Administered Date(s) Administered    COVID-19, mRNA, LNP-S, 100 mcg/0.5 mL (MODERNA)  (RED CAP) (12 YRS & OLDER) 07/03/2019, 07/31/2019, 03/14/2020, 08/27/2020    COVID-19, mRNA, PF (Moderna BIVALENT Booster) Unspecified 01/02/2021     Allergies   Allergen Reactions    Demerol [Meperidine (Pf)] Anaphylaxis       ROS: A complete review of systems was obtained, and in the absence of the findings indicated in HPI and below, all other systems are otherwise negative.    VITALS:   Weight: 62.7 kg (138 lb 3.7 oz)  Temp Temp Max: 36.5 C (97.7 F) Tc: Temp: 36.5 C (97.7 F)   Pulse Pulse Min: 67 Max: 81 Pulse: 71   BP  BP: (99)/(84)  BP: 99/84 No Data Recorded    Resp Resp Min: 16 Max: 24 Resp: 24   O2 Sat SpO2 Min: 92 % Max: 94 % SpO2: 92 %   O2 Delivery room air     EXAM:  General: NAD  Chest: Non-labored breathing  CV: RRR    Bilateral lower extremities:  Her bilateral lower extremities have chronic skin changes without evidence of any ongoing cellulitis or infection.  She does have right greater than left lower extremity swelling in the calves with 1+ pitting edema.  She has intact sensation to light touch in bilateral lower extremities in all distributions.  She has intact ankle dorsiflexion plantarflexion as well as EHL function of the bilateral lower extremities.  She has palpable symmetric DP pulses    For the right lower extremity, prior to her reduction, she was holding the hip in slight flexion with the knee bent.  There is no significant rotational malalignment and overall  appears most comfortable in a neutral position.    After her reduction, and while under sedation, she did have range of motion of the hip with flexion to greater than 90, external rotation to 45 degrees, and internal rotation to about 20 to 30 degrees.      Secondary Survey:  Other extremities and axial skeleton appear uninjured and patient denies pain or tenderness with palpation or motion.    LABS:  CBC:  BMP:   Recent labs for the past 72 hours     11/28/21  1855   WBC 5.5   HGB 8.8*   HCT 26.5*   PLT 514*    No results for  input(s): NA, K, CL, CO2, BUN, CR, GLU in the last 72 hours.    No results for input(s): CAIONWB, MG, PO4 in the last 72 hours.     LFT: COAGS:   No results found for: TBIL, TP, ALB  No results found for: AST, ALT, ALP No results found for: INR, APTT     CRP/ESR/LA  Other:    No results found for: CRP, ESR, LA       IMAGING:  Plain radiographs demonstrate T type acetabular fracture adjacent to right sided iliac bolts. There is medialization of the acetabulum and severe erosive changes of the femoral head.  After reduction, there is improved alignment on AP portable plain radiograph demonstrating symmetric leg lengths with lesser trochanter comparison to the contralateral side. However, CT pelvis was completed after reduction while the patient was in traction shows recurrent medialization and superiorization of the femoral head with loss of reduction.      PROCEDURES:   Under conscious addition, the patient underwent closed reduction and placement of distal femoral skeletal traction.  This was performed with the assistance of the ED.  Portable radiographs while patient sedated showed reduced right hip and acetabular fracture.  See procedure note for full details.      ASSESSMENT:  76 year old male with history of chronic opioid use (on buprenorphine), recent revision T10 to pelvis posterior spinal instrumentation and fusion (11/07/2021, outside neurosurgeon), and previous left total hip arthroplasty with periprosthetic fracture status post ORIF now here with a subacute right acetabulum fracture and associated femoral head erosion.  She initially sustained her ground-level fall on 8/26 (over a week ago) and was seen in outside ED with minimally displaced pubic rami fractures per radiology report.  She subsequently was told to maintain nonweightbearing status and was discharged. She then followed up with an outside orthopedic surgeon yesterday. At that time, she was referred back to the ED and then her imaging  demonstrated this fracture dislocation of her acetabulum with severe erosive loss of her femoral head for which she was transferred to [facility].     #Subacute right T-type acetabulum fracture with supra foveal femoral head erosion  - She was placed in skeletal traction overnight and hip reduced with subsequent recurrent medialization.  No plan for OR tonight.  - Will require surgical stabilization with possible staged arthroplasty given femoral head changes and pending discussion with our trauma and arthroplasty services      This patient will be followed by the following Orthopaedic team while in house. For questions, please page:   Orthopaedic Trauma Contact List :     Primary/1st Call:   FOR PATIENTS ON D14 AND D11 PLEASE PAGE: NP [phone] (Monday-Saturday 7am-5pm)  FOR PATIENTS ON ALL OTHER FLOORS PLEASE PAGE: NP [phone] (Monday-Friday 7am-5pm)  2nd Call: Piedad Climes  [id number]  3rd Call: Thayer Ohm [id number]  4th Call: Jerilee Hoh [phone]  5th Call: Georgina Peer [phone]  Cross-cover: [phone]  Cross-cover is primary contact 7d/week 6pm-7am and all day Sunday.  (This service has an in house resident covering all orthopedic patients. Please limit pages between the hours of 6pm-6am to critical issues)      For consult/ED related questions, please page [phone]. If patient is admitted and actively followed by an Orthopaedic service, please page per list above for questions.     Kandace Parkins, MD  Orthopaedic Surgery, PGY-2  Personal pager [phone]  PI #: [phone]    Lenon Curt, MD PGY-4  [facility]

## 2022-02-20 ENCOUNTER — Encounter: Payer: Self-pay | Admitting: Family Medicine

## 2022-02-20 DIAGNOSIS — J42 Unspecified chronic bronchitis: Secondary | ICD-10-CM

## 2022-02-20 MED ORDER — BUDESONIDE-FORMOTEROL HFA 160 MCG-4.5 MCG/ACTUATION AEROSOL INHALER
2.0000 | INHALATION_SPRAY | Freq: Two times a day (BID) | RESPIRATORY_TRACT | 3 refills | Status: DC
Start: 2022-02-20 — End: 2023-08-14

## 2022-02-20 MED ORDER — BUDESONIDE-FORMOTEROL HFA 160 MCG-4.5 MCG/ACTUATION AEROSOL INHALER
2.0000 | INHALATION_SPRAY | Freq: Two times a day (BID) | RESPIRATORY_TRACT | 3 refills | Status: DC
Start: 2022-02-20 — End: 2022-02-20

## 2022-02-20 NOTE — Telephone Encounter (Signed)
Refill authorized per P&T Protocol 02/20/2022

## 2022-02-20 NOTE — Telephone Encounter (Signed)
[facility] - Refill Team    Refill authorized per P&T Protocol 02/20/2022       Meets Epic Protocol: YES, as determined by Epic (See Protocol Details for specific information)      ====================================================================    Medication name: Symbicort  10/22/2021  Also history of COPD, previously followed pulmonology.  He has since quit smoking for the past year.  He is compliant with inhalers.  He does well at rest, but has continued shortness of breath with exertion.  He does monitor his oxygen saturation which is typically in the mid 90s.

## 2022-02-20 NOTE — Telephone Encounter (Signed)
From: [name]  To: [name], MD  Sent: 02/20/2022 1:33 PM PST  Subject: refill symbicort prescription    I thought my prescription was for [facility]. They said that they were going to contact you to renew the perscription    Confused and needing a refill quickly    Nadine Counts

## 2022-02-20 NOTE — Telephone Encounter (Signed)
[facility] - Refill Team    Request to re-route prescription to [facility] based on patient request     Refill request has been pended to the pharmacist for review per [facility] P&T Approved Protocol 02/20/2022

## 2022-02-27 ENCOUNTER — Encounter: Payer: Self-pay | Admitting: Family Medicine

## 2022-02-27 DIAGNOSIS — K219 Gastro-esophageal reflux disease without esophagitis: Secondary | ICD-10-CM

## 2022-02-27 MED ORDER — FAMOTIDINE 40 MG TABLET
40.0000 mg | ORAL_TABLET | Freq: Every day | ORAL | 3 refills | Status: DC
Start: 2022-02-27 — End: 2023-04-30

## 2022-02-27 NOTE — Telephone Encounter (Signed)
[facility] - Refill Team    Refill authorized per P&T Protocol 02/27/2022    .   Meets Epic Protocol: YES, as determined by Epic (See Protocol Details for specific information)      ====================================================================    Medication name: famotidine   Labs (if required by protocol): N/A

## 2022-03-04 ENCOUNTER — Telehealth: Payer: Self-pay | Admitting: Family Medicine

## 2022-03-04 NOTE — Telephone Encounter (Signed)
[facility]    Patient with Average Blood Pressure at Stage II HTN (>=140/90) on No/Mono antihypertensive therapy.     Mychart Message sent to patient on 02/28/2022.    I have attempted to contact patient for scheduling with the following results: Telephone call to patient successful: Three patient identifiers used. and Patient has been scheduled with PCP on 06/05/2022.    Patient would like sooner appointment as his BP has stayed unchanged and wants to discuss it sooner then March 2024.    Back office staff: please follow up with patient.    Leodis Sias, MA   Quality Improvement Team   Outreach - [facility]

## 2022-03-27 ENCOUNTER — Other Ambulatory Visit: Payer: Self-pay | Admitting: Family Medicine

## 2022-03-27 DIAGNOSIS — J42 Unspecified chronic bronchitis: Secondary | ICD-10-CM

## 2022-03-28 ENCOUNTER — Encounter: Payer: Self-pay | Admitting: Family Medicine

## 2022-03-28 DIAGNOSIS — J42 Unspecified chronic bronchitis: Secondary | ICD-10-CM

## 2022-03-28 MED ORDER — COMBIVENT RESPIMAT 20 MCG-100 MCG/ACTUATION SOLUTION FOR INHALATION
1.0000 | Freq: Four times a day (QID) | RESPIRATORY_TRACT | 3 refills | Status: DC
Start: 2022-03-28 — End: 2023-04-23

## 2022-03-28 NOTE — Telephone Encounter (Signed)
Pharmacy Refill Optimization (PRO)    Refill authorized per P&T Protocol 03/28/2022    .   Meets Epic Protocol: YES, as determined by Epic (See Protocol Details for specific information)    Pulmonology appointment 04/09/2022  ====================================================================    Medication name: Combivent  Labs (if required by protocol): N/A

## 2022-03-28 NOTE — Telephone Encounter (Signed)
Pharmacy Refill Optimization (PRO)    DUPLICATE REFILL REQUEST     Prescription renewal addressed on 03/28/2022, sent to [place] denied as DUPLICATE per PRO Workflow 03/28/2022

## 2022-04-08 NOTE — Progress Notes (Unsigned)
Comprehensive COPD Clinic      Dear Dr. Annamaria Boots,    We had the pleasure of seeing Kenneth Gordon in the [facility] today. As you recall Kenneth Gordon is a pleasant 77 year old man with a significant history of COPD and smoking. Kenneth Gordon was last seen on 05/05/2019 by Drs Suann Larry and Yvette Rack in [facility].    Subjective/Interval Events:   The pt tells me that he has trouble moving around and getting out of breath. His symptoms have progressed significantly since 2021.    He stopped smoking in 2021.      CAT from 04/09/2022: 24 (prior not done)    COPD Assessment Test (CAT)  How often do you cough?: 2   Do you have phlegm (mucus) in your chest?: 2   Do you have chest tightness?: 3   Do you feel breathless walking uphill or up the stairs?: 5   Are you limited doing any activities at home?: 3   Are you confident leaving home despite your lung condition?: 3   Are you able to sleep soundly despite your lung condition?: 2   How is your energy level?: 4     COPD Assessment Total Score:   Total Score:: 24       Interval Exacerbations: None    Smoking: Stopped in 2021    Pertinent respiratory medications:  Symbicort yes  Spiriva Respimat yes  Combivent PRN uses BID    Pertinent laboratory and other data:    A1AT- 186 mg/dL  Phenotype PiM2M2  CRP 0.5  RAST Panel- negative   AECs 100-500  IgE 75.8    CXR 10/22/2021. Hyperinflated lungs.     CXR 04/08/19   - Pulmonary hyperinflation c/w COPD  - No nodules by CXR     TTE 08/04/17  1. The LV systolic function is normal. The estimated LV ejection fraction is 60 %.  2. Trivial pericardial effusion.  3. Decreased left ventricular internal cavity size.  4. Mild aortic valve sclerosis without stenosis.  5. Mild concentric left ventricular hypertrophy.  6. The left ventricular diastolic function is abnormal. Stage I: Impaired early left ventricular relaxation (Abnormal relaxation pattern).  7. The left atrium is normal in size and  structure.  8. The right atrium is normal in size and structure.    PFT 10/06/17    Very severe COPD is confirmed by reduced expiratory flow rates, volumes, and the flow volume loop.  Severe lung hyperinflation. DLco is severely reduced probably because of pulmonary emphysema but anemia, ILD, pulmonary vascular and drug-induced lung disease should remain a concern.     6MWT 08/03/18           I did review patient's past medical and family/social history, no changes noted except as described above.  Previous clinic and hospital notes were reviewed.     Review of Systems     Review of Systems   Constitutional: Negative.    HENT:  Positive for congestion, postnasal drip and trouble swallowing.    Respiratory:  Positive for choking and shortness of breath.         Occasional aspiration  Once per week   Cardiovascular:  Negative for leg swelling.   Gastrointestinal:         Occasional GERD helped by meds   Allergic/Immunologic: Positive for environmental allergies.   Neurological: Negative.    All other systems reviewed and are negative.    All systems  were reviewed and were negative unless otherwise noted in the HPI and above.    Medications and Allergies       No outpatient medications have been marked as taking for the 04/09/22 encounter (Office Visit) with Carlyon Shadow, MD.       No Known Allergies  Medication reconciliation was performed today.    Vitals & Physical Exam     Vital Signs: BP 131/72 (SITE: left arm, Orthostatic Position: sitting, Cuff Size: large)   Pulse 96   Temp 36.3 C (97.3 F) (Temporal)   Resp 16   Ht 1.88 m (6\' 2" )   Wt 93 kg (205 lb 0.4 oz)   SpO2 97%   BMI 26.32 kg/m      Physical Exam  Vitals reviewed.   Constitutional:       Appearance: He is not ill-appearing.      Comments: Caregiver at visit   HENT:      Head: Normocephalic and atraumatic.      Right Ear: External ear normal.      Left Ear: External ear normal.      Nose: Nose normal. No congestion.      Mouth/Throat:      Mouth:  Mucous membranes are moist.      Pharynx: Oropharynx is clear.   Eyes:      Extraocular Movements: Extraocular movements intact.      Conjunctiva/sclera: Conjunctivae normal.      Pupils: Pupils are equal, round, and reactive to light.   Cardiovascular:      Rate and Rhythm: Normal rate and regular rhythm.      Heart sounds: No murmur heard.  Pulmonary:      Comments: Diminished air movement b/l  Abdominal:      General: Abdomen is flat.      Palpations: Abdomen is soft.   Musculoskeletal:      Right lower leg: No edema.      Left lower leg: No edema.   Skin:     Comments: Dry scaling rash on LEs   Neurological:      Mental Status: He is alert and oriented to person, place, and time.      Comments: Sitting on a walker   Psychiatric:         Mood and Affect: Mood normal.         Behavior: Behavior normal.         Labs & Studies       Lab Results   Component Value Date    WBC 7.0 10/22/2021    HGB 14.5 10/22/2021    PLT 215 10/22/2021     No results found for: "HCO3"  Peripheral Eos:   Lab Results   Component Value Date    AUTOEOS 1.3 04/08/2019    AUTOEOSABS 0.1 04/08/2019    AUTOEOSABS 0.2 02/18/2018    AUTOEOSABS 0.2 07/22/2017     IgE:   Lab Results   Component Value Date    IGE 75.8 (H) 10/16/2017           I personally reviewed: images, PFTs, echocardiogram, laboratory, and prior notes.    Assessment and Plan       (J44.9) Chronic obstructive pulmonary disease, unspecified COPD type (HCC)  (primary encounter diagnosis): The pt has very severe airflow limitation and a prior heavy smoking history. He quit smoking in 2021. He is currently on triple inhaled therapy which is appropriate, though he is not fully controlled. Today we discussed  attending PR which I think will help his sxs. If he continues to have poor control, he may be a candidate for dupilumab and/or EBVs.        GOLD grade: IV; group: E                Phenotype:  Ephysema, asthma overlap, and frequent exacerbator    Clinician estimated probability of  severe COPD exacerbation (ie, ED or hospital admission) in the following 90 days: moderate    Bronchodilator therapy:   Proper bronchodilator use was reviewed with the patient. The current regimen includes:    Symbicort   Spiriva Respimat   Combivent     In patients with severe air trapping or diminished PIF, nebulized bronchodilators and inhaled corticosteroids can offer superior drug delivery.    Airway Clearance: None    Comorbidity Screening: patients with COPD are at an increased risk for multiple concurrent diseases which can exacerbate dyspnea and symptoms.    Alpha-1 Antitrypsin Deficiency Screening: [id number]    Pulmonary Rehabilitation: Pulmonary rehabilitation programs involve exercise training, education, and behavior change and have been shown to decrease dyspnea, improve health-related quality of life, decrease days of hospitalization, and decrease health-care utilization. The optimal results are seen with motivated patients.   This has been offered to the patient.     Regardless of participation, continued activity and exercise is recommended for all patients with COPD.     Lung Cancer Screening: Lung cancer screening with low dose CT scan is indicated annually for patients aged 70 to 65 years who have over 30 pack-year of tobacco smoking and who are active smokers or quit within the past 15 years.    Mechanical Ventilation: Consideration of nocturnal mechanical ventilation is warranted in patients with a resting PaCO2 >45 when not in exacerbation. Prior to initiation, patients should be screened for obstructive sleep apnea (OSA) with an Epworth sleepiness and STOP-BANG and a sleep study if indicated.     Lung Volume Reduction Interventions (Endobronchial valves and surgery):     The efficacy of lung volume reduction surgery (LVRS) varied among patient groups, but there was an overall survival advantage that was most marked in patients with upper lobe emphysema and low exercise capacity (but FEV1 >20%  and DLCO >20% predicted).      Endobronchial valves are indicated in patients with persistent, severe symptoms despite maximal medical therapy (including pulmonary rehabilitation) with evidence of hyperinflation (TLC >100% and RV >175%) and CT demonstrated severe emphysema and fissure completeness.  In the appropriately selected patient, this procedure has been shown to improve symptoms, FEV1, and 6 minute walk distance, but has a risk of pneumothorax of ~30%.  Our process for evaluation is therefore methodical, requiring a a 1 mm contiguous non-contrast CT of the chest and PFTs within the past year, subsequent analysis for fissure completeness with CT analysis and potential bronchoscopic evaluation, and confirmation of maximal medical therapy. Patients are then evaluated in a multidisciplinary team for candidacy.      Lung Transplantation Evaluation: NA    Exacerbation Prevention:  Roflumilast and chronic azithromycin (and to a lesser degree NAC) use are generally limited to COPD patients with recurrent exacerbations despite maximally tolerated inhaled therapies.      Palliative Care: disease trajectory is often difficult to predict in COPD. Frequent discussions with patients with severe or deteriorating COPD regarding their wishes is recommended.         Pneumococcal vaccinations:  I educated/instructed the patient or caregiver regarding all aspects of the above stated plan of care.  The patient or caregiver indicated understanding. Overall, I spent 35 minutes on today's visit with 20 minutes directly with the patient discussing education and our plan for further treatment and evaluation.     We will plan to see Kenneth Gordon in [facility] in 4 months for follow-up.  Please do not hesitate to contact us with any additional questions or concerns.     Sincerely,        Prince Rome, MD, MAS  Professor of Medicine  [facility] [place]        [address], [place]  [place], [place]  Phone: [phone]  Fax: [phone]

## 2022-04-09 ENCOUNTER — Encounter: Payer: Self-pay | Admitting: Internal Medicine

## 2022-04-09 ENCOUNTER — Ambulatory Visit: Payer: Medicare HMO | Attending: Internal Medicine | Admitting: Internal Medicine

## 2022-04-09 VITALS — BP 131/72 | HR 96 | Temp 97.3°F | Resp 16 | Ht 74.0 in | Wt 205.0 lb

## 2022-04-09 DIAGNOSIS — J449 Chronic obstructive pulmonary disease, unspecified: Secondary | ICD-10-CM | POA: Insufficient documentation

## 2022-04-09 DIAGNOSIS — Z79899 Other long term (current) drug therapy: Secondary | ICD-10-CM | POA: Insufficient documentation

## 2022-04-09 DIAGNOSIS — F17200 Nicotine dependence, unspecified, uncomplicated: Secondary | ICD-10-CM

## 2022-04-09 DIAGNOSIS — J439 Emphysema, unspecified: Secondary | ICD-10-CM | POA: Insufficient documentation

## 2022-04-09 DIAGNOSIS — J4489 Other specified chronic obstructive pulmonary disease: Secondary | ICD-10-CM | POA: Insufficient documentation

## 2022-04-09 DIAGNOSIS — Z87891 Personal history of nicotine dependence: Secondary | ICD-10-CM | POA: Insufficient documentation

## 2022-04-09 DIAGNOSIS — Z7951 Long term (current) use of inhaled steroids: Secondary | ICD-10-CM | POA: Insufficient documentation

## 2022-04-09 NOTE — Nursing Note (Signed)
Patient was verified with 2 identifiers, vital signs taken, allergies verified, and patient was screened for pain. Patient comes in ambulatory.    Patient has with DME company Does not recall    neb    Patient is in ROOM # 1    Patient was NOT wearing a surgical mask  Contact precautions were followed when caring for the patient.         Hartley Barefoot, MA

## 2022-04-09 NOTE — Patient Instructions (Addendum)
Please contact the COPD case manager for any questions about your COPD or your COPD action plan. The COPD case manager can be reached by calling their office at [phone] for non-urgent questions. For urgent question, please contact the COPD case manager by pager at [phone](COPD).  After the recorded message, please enter your phone number and he/she will return your call between 7 AM and 7 PM. For urgent concerns after hours, please contact emergency services.     If you have any testing performed outside of [facility], please call our office to let us know once that testing is complete.      The following studies have been ordered for you:      Pulmonary Function test (To schedule call [phone])  6 Minute walk test (To schedule call [phone])      Once authorized by your insurance company you will receive a call from our office telling you it is time to call and schedule.  If you are having it done at [facility] please use the number(s) listed.  If you do not receive a call within 7 days please call our office at [phone].

## 2022-04-18 ENCOUNTER — Telehealth: Payer: Self-pay | Admitting: Family Medicine

## 2022-04-18 NOTE — Telephone Encounter (Signed)
Received fax from pharmacy.  Spiriva requires PA.    Previous PA expired.    Please renew PA.

## 2022-04-19 NOTE — Telephone Encounter (Signed)
APPROVED.    (SPIRIVA RESPIMAT) 2.5 mcg/actuation Inhaler  #4 per 28 days.    Patient was notified of approval by prior authorization team via [place] (if enrolled).    Central Rx Prior Authorization Team

## 2022-04-19 NOTE — Telephone Encounter (Signed)
The Prior Authorization team has received this request for (SPIRIVA RESPIMAT) 2.5 mcg/actuation Inhaler  #4 / 28 days.  For prior authorization status, please refer to the "Referral/Authorizations" tab in the patient's chart.  There, requests that are in process or completed will be viewable under the Referral Type "Medication Prior Authorization".       Notification:    Approvals: We will notify the patient via MyChart (if enrolled).  Denials/Requests for additional information: We will notify the clinic/provider to determine next steps.     Thank you.

## 2022-06-05 ENCOUNTER — Ambulatory Visit: Payer: Medicare HMO | Admitting: Family Medicine

## 2022-06-11 ENCOUNTER — Telehealth: Payer: Self-pay | Admitting: Family Medicine

## 2022-06-11 NOTE — Telephone Encounter (Signed)
Received fax from pharmacy.  Symbicort requires PA.

## 2022-06-13 ENCOUNTER — Other Ambulatory Visit: Payer: Self-pay

## 2022-06-13 NOTE — Telephone Encounter (Signed)
Pharmacy received a paid claim for generic symbicort on 06/10/22 (84mo supply).    No further PA action required at this time.

## 2022-07-20 ENCOUNTER — Telehealth: Payer: Self-pay

## 2022-07-20 NOTE — Telephone Encounter (Signed)
[facility] Pulmonary Rehabilitation Program (PRP) Telephone Intake Questions    ID patient: Patient identification confirmed using 3 patient identifiers (i.e. Name, DOB, MRN, last 4 of SSN, and/or address).    Pulmonary Rehabilitation  Do you know what pulmonary rehabilitation is? No, explained to patient    Have you been through a pulmonary rehabilitation program before? no    Are you interested attending pulmonary rehab? Yes, Needs to talk to son about dates as he will be driving him.  Are you able to commit to 8 weeks? yes    Communication/Telehealth  Devices for telehealth: yes  [facility] (formerly MyChart): yes   Email address: [email or web address]    Monitoring equipment  Pulse oximeter: yes  BP monitor: yes, wrist  Scale: yes  PEFR: no  Glucometer: no  Home exercise equipment: no    Potential Barriers  Do you have transportation? family member - son will bring him  Is [place] your primary language? yes  If no, what language? na  What are your interpretation options? na    What are your pay options?  Primary Insurance: [employer]   Secondary Insurance: -  Self-pay -    Are you currently smoking? Quit 2021  (Quit for 3 months)    Activity  Do you use any assistive devices? no  Walker: has a walker. Uses when walking long distance to rest on when short of breath  Cane: no  Scooter: no    Symptom Burden  Exacerbations  Number of respiratory exacerbations (needing abx or prednisone or change in therapy) in the past 12 months? no  Number of ER visits:  no  Number of Admissions:  no  Number of times on antibiotics: no  Number of times on prednisone: no    Oxygen  Supplemental oxygen requirements:RA     Pain  Do you have pain? 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 The LV systolic function is normal. The estimated LV ejection fraction is 60 %.    2. Trivial pericardial effusion.    3. Decreased left ventricular internal cavity size.    4. Mild aortic valve sclerosis without stenosis.    5. Mild concentric left ventricular hypertrophy.    6. The left ventricular diastolic function is abnormal. Stage I: Impaired early left ventricular   relaxation (Abnormal relaxation pattern).    7. The left atrium is normal in size and structure.    8. The right atrium is normal in size and structure.     PFT: 10/06/17  Very severe COPD is confirmed by reduced expiratory flow rates, volumes,  and the  flow volume loop.  Severe lung hyperinflation is confirmed by the Motley index  of 58%.  DLco is severely reduced probably because of pulmonary emphysema but  anemia, ILD, pulmonary vascular and drug-induced lung disease should remain a  concern.  Pulmonary rehabilitation recommended.  Clinical correlation required.  Eliberto Ivory, MD, PI [phone]  Kathrynn Running, (2017-10-11 08:29)     08/03/18      VL ABDOMINAL AORTA / IVC / ILIAC DUPLEX, LIMITED  12/20/2020   IMPRESSION:  3.6 CM ABDOMINAL AORTIC ANEURYSM  PATENT BILATERAL ILIAC ARTERIES WITHOUT STENOSIS     Electronically Signed by: Valerie Salts, MD, RPVI on 2020-12-21 09:20:55 AM  End of Report    ABI 04/27/2018  IMPRESSION:  NORMAL ARTERIAL PERFUSION TO THE RIGHT LOWER EXTREMITY.      MODERATE ARTERIAL INSUFFICIENCY OF THE LEFT LOWER EXTREMITY TO THE LEVEL OF THE ANKLE WITH SEVERE   INFRAMALLEOLAR PERFUSION.    Electronically Signed by: Valerie Salts, MD, RPVI on 2018-04-27 60:45:40 PM  End of Report    CXR 10/22/2021  FINDINGS:  Lungs are markedly hyperinflated. The heart is asthenic. Aorta is within  limits for age.     Vague densities at both bases corresponding to lungs are clear no evidence  of pleural fluid or edema. No acute bony change.     IMPRESSION:  1. Pulmonary hyperinflation.     2. Symmetric densities at likely reflect combination shadow of nipple  and breast      Alpha 1 test 10/12/2017      Component  Ref Range & Units    ALPHA-1-ANTITRYPSIN  90 - 200 mg/dL 981    Comment: To convert to umol/L, multiply mg/dL by 1.914   [id number] PHENO M2M2    Comment:        IgE 75.8      Additional notes  Referred by: Dr. Karie Chimera  Informed patient about the Hybrid PR program. Patient is comfortable with telehealth education format and consents to participate.  Initial pdf of Patient Intake and Initial questionnaire packet will be sent via y at a later date.    Class days and times  Monday/Wednesday 8:00-9:30 or 10:30-12:00  Tuesday/Thursday    8:00-9:30 or 10:30-12:00 or 1:30-2:30   Are you able to attend the Telehealth Education class times: Fridays 11:00-12:30? yes          Appointments  Exercise Eval appt: 10/31/2022 1330  MD Eval appt: COPD note  Program dates: August 19 to January 01, 2023    Intake routed to [facility] Director, Dr. Waldon Reining, for approval to evaluate patient for the program.

## 2022-07-31 NOTE — Telephone Encounter (Signed)
Medical Director Review for Pulmonary Rehab Evaluation  I have briefly reviewed the patient's medical record and note by RPR. He has significant SOB and pulmonary impairment that warrant evaluation by the PRP team.  Dr Delorean Knutzen MD

## 2022-08-07 ENCOUNTER — Ambulatory Visit: Payer: Medicare HMO

## 2022-08-13 ENCOUNTER — Ambulatory Visit: Payer: Medicare HMO | Admitting: Internal Medicine

## 2022-09-30 ENCOUNTER — Other Ambulatory Visit: Payer: Self-pay | Admitting: Family Medicine

## 2022-09-30 DIAGNOSIS — R140 Abdominal distension (gaseous): Secondary | ICD-10-CM

## 2022-10-03 ENCOUNTER — Encounter: Payer: Self-pay | Admitting: Family Medicine

## 2022-10-03 ENCOUNTER — Other Ambulatory Visit: Payer: Self-pay | Admitting: Family Medicine

## 2022-10-03 DIAGNOSIS — I724 Aneurysm of artery of lower extremity: Secondary | ICD-10-CM

## 2022-10-06 ENCOUNTER — Encounter: Payer: Self-pay | Admitting: Family Medicine

## 2022-10-06 DIAGNOSIS — J42 Unspecified chronic bronchitis: Secondary | ICD-10-CM

## 2022-10-07 MED ORDER — ATORVASTATIN 40 MG TABLET
40.0000 mg | ORAL_TABLET | Freq: Every day | ORAL | 1 refills | Status: DC
Start: 2022-10-07 — End: 2022-10-10

## 2022-10-07 NOTE — Telephone Encounter (Signed)
Pharmacy Refill Optimization (PRO)    Per PRO Protocol, approval or denial will be determined by the provider      Bypassing Epic Protocol Determination (See Protocol Details for specific information)     Meets PRO P&T Approved Protocol: NO - Medication not reviewed/reconciled in past 12 months     Patient will be due for office visit this month.   ====================================================================    Medication name: ATORVASTATIN   Labs (if required by protocol):   Lab Results   Component Value Date    CHOL 128 10/22/2021    CHOL 177 02/18/2018    LDLC 61 10/22/2021    LDLC 108 02/18/2018    HDL 54 10/22/2021    HDL 54 02/18/2018

## 2022-10-07 NOTE — Telephone Encounter (Signed)
Pharmacy Refill Optimization (PRO)    Per PRO Protocol, approval or denial will be determined by the provider      Bypassing Epic Protocol Determination (See Protocol Details for specific information)     Meets PRO P&T Approved Protocol: NO - Medication not reviewed/reconciled in past 12 months   ====================================================================    Medication name: ATORVASTATIN   Labs (if required by protocol):   Lab Results   Component Value Date    CHOL 128 10/22/2021    CHOL 177 02/18/2018    LDLC 61 10/22/2021    LDLC 108 02/18/2018    HDL 54 10/22/2021    HDL 54 02/18/2018

## 2022-10-09 ENCOUNTER — Encounter: Payer: Self-pay | Admitting: Family Medicine

## 2022-10-09 DIAGNOSIS — I724 Aneurysm of artery of lower extremity: Secondary | ICD-10-CM

## 2022-10-09 DIAGNOSIS — J42 Unspecified chronic bronchitis: Secondary | ICD-10-CM

## 2022-10-09 MED ORDER — SPIRIVA RESPIMAT 2.5 MCG/ACTUATION SOLUTION FOR INHALATION
2.0000 | Freq: Every day | RESPIRATORY_TRACT | 0 refills | Status: DC
Start: 2022-10-09 — End: 2022-10-10

## 2022-10-09 NOTE — Telephone Encounter (Signed)
Pharmacy Refill Optimization (PRO)    Refill authorized per P&T Protocol 10/09/2022     Meets Epic Protocol: YES, as determined by Epic (See Protocol Details for specific information)      ====================================================================    Medication name: Tiotropium    LOV 10/22/2021: COPD discussed.   NOV scheduled for 12/12/2022

## 2022-10-09 NOTE — Telephone Encounter (Signed)
Pharmacy Refill Optimization (PRO)    DUPLICATE REFILL REQUEST     Prescription renewal addressed on 10/07/2022, sent to [facility]    Encounter closed per PRO Workflow 10/09/2022

## 2022-10-09 NOTE — Telephone Encounter (Signed)
General Advice / Message to MD:    Patient calling back, message relayed. Patient scheduled 12/12/22 at 8:40. No further action is needed.     Wonda Cheng   [facility] PSR II

## 2022-10-09 NOTE — Telephone Encounter (Signed)
From: [name]  To: [name]  Sent: 10/09/2022 3:43 PM PDT  Subject: pharmacy?    I did not approve or request this pharmacy.. My preferred pharmacy is [facility], [place], [place], [place]. I have deleted this pharmacy from my account.     Tiotropium (SPIRIVA RESPIMAT) 2.5 mcg/actuation Inhaler [employer] R Young]    Preferred pharmacy: [facility] DELIVERY) - [place], [place] - [address], [phone] [facility] [phone]    Refills have been requested for the following medications:     Atorvastatin (LIPITOR) 40 mg tablet [Christopher R Young]    Preferred pharmacy: [facility] DELIVERY) - [place], [place] - [address], [phone] [facility] [phone]  Delivery method: Mail

## 2022-10-09 NOTE — Telephone Encounter (Signed)
Called and left VM for patient requesting a call back to schedule an appt.     Thank You,   Lisette Montelongo, PSR III Lead  Patient Contact Center

## 2022-10-14 MED ORDER — SPIRIVA RESPIMAT 2.5 MCG/ACTUATION SOLUTION FOR INHALATION
2.0000 | Freq: Every day | RESPIRATORY_TRACT | 0 refills | Status: DC
Start: 2022-10-14 — End: 2022-11-01

## 2022-10-14 MED ORDER — ATORVASTATIN 40 MG TABLET
40.0000 mg | ORAL_TABLET | Freq: Every day | ORAL | 0 refills | Status: DC
Start: 2022-10-14 — End: 2022-12-12

## 2022-10-14 NOTE — Telephone Encounter (Signed)
Pharmacy Refill Optimization (PRO) - Refill Team    Request to re-route prescription to Retail Pharmacy based on patient request     Reordering authorized per P&T Protocol 10/14/2022

## 2022-10-17 ENCOUNTER — Telehealth: Payer: Self-pay

## 2022-10-17 NOTE — Telephone Encounter (Signed)
Pulmonary Rehabilitation Telephone Encounter     10/17/2022  1:46 PM    I have attempted to contact this patient with the following results: Three patient identifiers confirmed. Calling to confirm upcoming appointments. Patient confirmed the following:    Exercise eval: August 8 at 1:30 PM  Enrollment: MW 10:30-12:00 PM    Patient requested we just remind him of class enrollment dates and times, will provide for patient at first appointment. Patient appreciative of the phone call.     Ahmani Daoud, BS, RRT  Pulmonary Rehabilitation Coordinator

## 2022-10-28 NOTE — Progress Notes (Signed)
Pulmonary Rehabilitation/Outpatient Respiratory Services Evaluation     Date of Service: 10/28/22 10:29 AM    Kenneth Gordon is a 51yr year old male with a diagnosis of COPD stage 4 Group E was referred to the [facility] Pulmonary Rehabilitation Program by Dr.Scivo. BODE Index of 9 (18%).     Past Medical History:   Diagnosis Date    Allergic rhinitis     Aneurysm of left popliteal artery 6cm     04/2016 stent in left popliteal artery-> 10/2016 thrombectomy/stent graft of left posterior tib  artery    COPD (chronic obstructive pulmonary disease)     Essential hypertension     Hematuria     10/2016 -> neg cystoscopy ;  on xarelto x 1 month after angioplasty/stent graft of left popliteal artery, thromboembolectomy of tibioperoneal/post tibial arteries    PAD (peripheral artery disease)     Psoriasis     Tobacco dependence      SOCIAL HISTORY  Kenneth Gordon has a 7.5-pack year history of smoking cigarettes (per EPIC), quitting in a 2021.     During exercise patient needed to stop, sit and rest after walking one and half minutes. Recovered after an equal amount of time. Was able to walk again for another minute however needed to rest again for two minutes to recovery. Patient stated feet "hot". Patient denies chest pain, lightheadedness or dizziness.    Patient reports experiencing the most shortness of breath while doing most ADLs.  Kenneth Gordon uses Combivent respimat 1 puff four times daily and Breztri 2 puffs BID. Patient states goal(s) are to be more independent and have less SOB with ADLS and walking.    Kenneth Gordon was fully evaluated by me on 10/31/2022 using the following evaluation tools:   Aerobic endurance using Six Minute Walk Test  Upper body strength assessment: One arm bicep curls  Lower body strength assessment: Chair sit-to-stand  Fall risk assessment: Berg balance test     A physical examination, patient interview and review of PRP coordinators intake evaluation was also included.  Based on this evaluation it was determined that Kenneth Gordon should be admitted into the [facility] Pulmonary Rehabilitation Program.      MEDICATIONS:  Medications were reviewed and confirmed in the EMR and with patient.     Current Outpatient Medications:     Acitretin (SORIATANE) 25 mg Capsule, Take 1 capsule by mouth every morning with a meal., Disp: 30 capsule, Rfl: 11    Albuterol (ACCUNEB) 1.25 mg/3 mL nebulizer solution, Use 3 mL in nebulizer 4 times daily if needed., Disp: 75 mL, Rfl: 5    Albuterol/Ipratropium (COMBIVENT RESPIMAT) 20-100 mcg/actuation Inhaler, Take 1 puff by inhalation 4 times daily., Disp: 12 g, Rfl: 3    Aspirin 81 mg Chewable Tablet, Take 81 mg by mouth every day., Disp: , Rfl:     Atorvastatin (LIPITOR) 40 mg tablet, Take 1 tablet by mouth every day., Disp: 90 tablet, Rfl: 0    Azelastine Nasal (ASTELIN) 137 mcg (0.1 %) Spray, Instill 2 sprays into EACH nostril 2 times daily., Disp: 1 bottle, Rfl: 0    Budesonide/Formoterol (SYMBICORT) 160-4.5 mcg/actuation Inhaler, Take 2 puffs by inhalation 2 times daily., Disp: 30.6 g, Rfl: 3    FamoTIDine (PEPCID) 40 mg Tablet, Take 1 tablet by mouth every day., Disp: 90 tablet, Rfl: 3    Fluticasone (FLONASE) 50 mcg/actuation nasal spray, GENERIC FOR FLONASE-- SHAKE LIQUID AND USE 2 SPRAYS IN [facility]  NOSTRIL EVERY DAY, Disp: 48 g, Rfl: 3    Lisinopril (PRINIVIL, ZESTRIL) 30 mg tablet, Take 1 tablet by mouth every day., Disp: 90 tablet, Rfl: 3    Tiotropium (SPIRIVA RESPIMAT) 2.5 mcg/actuation Inhaler, Take 2 sprays by inhalation every day., Disp: 12 g, Rfl: 0    ROS  All systems have been reviewed and are negative other than as in the HPI above.     Vital Signs: HR= 96, BP= 120, RR= 68, SpO2= 95% on RA, Temp= -, RPD= 0.5/10, RPE= 0/10    PFT: FVC= 52%  FEV1= 24%   FEV1/FVC= 34%   TLC = 79%   DLCO = 39%     ADJ DLCO= 38%    : 120 meters walked while on RA. RPD= 3/10, RPE= 3/10.      PHYSICAL EXAM:  General Appearance: healthy, alert, no distress,  pleasant affect, cooperative within limitations.   Nose: not assessed  Mouth: normal. Airway class not assessed  Heart: normal rate and regular rhythm  Lungs: Clear and diminished  Extremities: no cyanosis, clubbing, or edema.   Skin: not assessed  Neuro: Non-focal, gait steady   Mental Status: Pleasant  Musculoskeletal: Limited due to occasional muscle cramping.    PULMONARY REHABILITATION BASELINE STUDIES: See DOS 10/31/2022 for exercise evaluation data    1) Exercise Limitation and SOB due to diagnosis of. COPD stage  will/will not require oxygen with exercise.  Principles of strength and endurance will be taught and monitoring for oxygen needs.    2) Nutrition: BMI= 26.8; Weight= 209 lbs. The program will provide nutritional and meal planning education.     3) Psychosocial: PHQ-9 = P,       GAD7 = P   The program will provide additional support and information re: stress management    4) Sleep: PSQI= P; STOP-BANG = P; Epworth sleepiness score = P. Principles of sleep hygiene will be discussed and how they impact with lung disease    5) Health-related Quality of Life: [place] SOB= P/120; SGRQ Total= P; CAT= P; MMRC= P.    5) Palliative Needs-- POLST form and advanced directives as well as specific quality of life wishes will be explored further and education regarding progressive palliative disease directed care will be provided. We will also refer to HME for "8 Weeks to a Healthier You".     We appreciate the opportunity to assist with your patient care and enhance quality of life.    Key portions of the history and physical examination and exercise evaluation has been reviewed and confirmed and attested by MD co-signature.  The care plan was jointly developed with PRP Coordinator, who has transcribed this note.  Jadan Rouillard, MHAL, BA, RRT-NPS, RCP, CES  Date: 10/31/2022    Pulmonary Rehabilitation Medical Director Note  I agree with the assessment and plan as outlined in this COPD note.  Cordially, Dr.  Waldon Reining A. Dorethea Clan MD, MS, FAACP  Professor of Internal Medicine  Medical Director of Pulmonary Rehab    Report electronically signed by Waldon Reining, MD  Signature/Date:

## 2022-10-28 NOTE — Pulmonary Rehab Treatment Plan (Addendum)
INDIVIDUALIZED TREATMENT PLAN:   Pulmonary Rehabilitation/Outpatient Respiratory Therapy Services/Date: 10/31/2022  Kenneth Gordon is a 77yr old male with referring pulmonary diagnosis of COPD stage 4 Group E. Secondary pulmonary diagnosis included none. Kenneth Gordon was referred to the [facility]/Outpatient Respiratory Therapy service program by Dr. Karie Chimera.      Medical Necessity/Special Needs  Patients' pulmonary health history: Patient states he has more progressive SOB with moving about his home since 2021. Reports occasional cough, phlegm and chest tightness. His lung condition has greatly limited his activity level and is limited doing activities around the house. Mostly sedentray.  Pulmonary diagnostic tests completed: PFT,  CXR, A1AD = MM, IGE 75.8, RAST.  Cardiovascular tests completed: EKG, Echo, CT angioi, [place] LE, ABI    Smoking history:  Have you ever smoked or used tobacco? yes  Do you smoke currently? no  Year quit? 2021  Number packs per day? 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    Medical necessity: yes  Symptoms persistent despite medical therapy: yes  Increased AE/hospitalizations: no  Functional limits related to chronic lung disease: yes  HRQoL impairment: yes    Lab and test results reviewed with PR MD in PR Intake note DOS 07/31/2022 and reviewed on 10/28/2022.    PFT date: 10/06/2017  FVC 52%  FEV1 24% FEV1/FVC 34%  TLC 79% DLCO 39% Adj DLCO 38%    Patient Goals: Patient states have less SOB with exertion, be more independent, walk half a mile     What activities would you like to be able to do if you could? Walk longer distances with less SOB  What are your family's goals for you? Stay independent    EXERCISE ASSESSMENT  Initial Exercise Assessment/Date: 10/31/2022  Current exercise:  Patient currently exercises:  none  Limitations/Barriers to exercise: SOB, muscle fatigue, muscle cramping  Exercise equipment at home: no  Currently activity level [id number]     Initial :   Distance (meters): 120   Speed: 0.75  RPD/RPE: 3/3   METS: 1.5  Prior 6 MWD: 300   Completed: 2019.  Functional Fitness tests:   One arm bicep curl: 13 reps with 8# DB    RPD/RPE: 2/2  Sit to stand: 10 reps    RPD/RPE: 4/4  Fall risk score: 8/22  Fall risk: Low  Uses cane, walker/rollator or wheelchair: Yes 4 wheelwalker    Physical deconditioning: Yes  No regular exercise/sedentary lifestyle: Yes  Knowledge deficit of exercise guidelines and safety: Yes  Decreased exercise tolerance: Yes  Exercise-induced hypoxemia: No    Pain Assessment:  Pain Problems   Pain Score     Current treatment  ?Chest pain    -       -  ?Incision pain   -       -  ?Leg pain    4       Stop and rest  ?Muscle/joint pain   1-2       Movement for stiff joints      Other    -       -    Bone/Joint Problems  Pain Score Current treatment  ? Arthritis    -       -  ? Knee Surgery   -       -   ?  Back Surgery   -       -   ? Back Pain    -       -   ? Hip Replacement   -       -   ? Knee Replacement  -       -   ? Leg Vascular Surgery  4       surgery  ? Rotator Cuff    -       -  ? Shoulder Pain  -       -  ? Other    -       -    Exercise Plan  Exercise Goals:   Improve understanding of aerobic and resistance training and oxygen needs  Verbalize understanding of rate of perceived dyspnea (RPD) and rate of perceived exertion (RPE) as exercise guidelines for intensity.  Practice exercise prescription as advised by PR program coordinators under PR/ORS program medical director supervision.  Functional Capacity Goals:  Increase 6 MWD by > 30 meters  Increase physical activity > 20 min/day  Demonstrate proper dyspnea control with pursed lip breathing and pacing  Attend education on Benefits of Exercise    Exercise Interventions/Education  EXERCISE PRESCRIPTION  DO NOT EXERCISE PATIENT  WITH PRE-EXERCISE VITALS OF:  BP > 140 or < 90/50  HR > 100 or < 60   SpO2 < 90   RR > 24 or < 8      Begin the following exercise modalities 2-3 times per week for 8 weeks as follows:   Energy manager for MET level of 1.0 to 1.5. Increase up to 10%/week. Keep SpO2 >92%, HR between 86 to 115 bpm, or 4-6/10 on Borg CR 10 scale.   Treadmill: Begin at 0.8 - 1.0 mph for 5 to 20 minutes   Recumbent Stepper: Begin at load of 1 for 5 to 20 minutes   Recumbent Bicycle: Begin at level 1 for 5 to 20 minutes  Arm Ergometer: Begin at level 1 for 5 to 20 minutes   Over ground walking: Begin for 5 to 20 minutes, self-paced    Note: Progress individualized exercise program based on participant responses to exercise, including HR, SpO2, BP, Borg rating and symptoms of exercise tolerance. Exercise intensity will be progressed individually and titrated based on 4-6/10 on Borg CR 10 scale. Progression of exercise training volume will result from increases in time, intensity, and frequency. Initial emphasis will be on increasing time.    Resistance Training Increase up to 10% when RPE is < 4, as tolerated to keep 4-6/10 on Borg CR 10 scale.  Resistance bands/Free weights/Weight machine  Begin with yellow resistance band and 5# weight. 1-3 sets of 10-15 reps.  Bicep curls  Upright rows   Chest press  Back pull   Triceps extension  Reclined sit ups  Sit-to-Stands    Respiratory Muscle training   Frequency:  Twice daily, six days a week or as stated by your healthcare professional  Setting (I:E):  2/2  Time:  Strive to complete 3 sets of 10 repetitions  Type: Flow resistor    May order and administer Albuterol 0.083%, 2.5mg /73ml unit dose via hand-held nebulizer times 1 PRN for wheezing and shortness of breath. Notify referring physician if symptoms do not improve or additional bronchodilator treatments are needed.     May utilize supplemental oxygen 1-4 liters/min via nasal cannula during exercise to keep SpO2 greater than 92%.  NOTE:  if needed, may use high flow nasal cannula, simple mask, oxy mask, oxymizer oxygen conserver if more comfortable and if indicated.     May utilize [employer] Life2000 ventilation system. Mode: A/C, Vt = Rest/Low activity 150 mL, Medium activity 180 mL, High activity 200 mL, PEEP 0-5 cm H20, BR 0-20, I time 0.8-1.0 sec, Sensitivity 4. May adjust within +/- 75 mL of volume and/or O2 for each activity level as needed to maintain SpO2 >90% and adequate ventilation. Adjust to patient comfort for all Rxs.    May dispense and instruct patient on Oscillating PEP device as tolerated by HR, SpO2, RPD and RPE. PEP device opens weak or collapsed airways to mobilize and assist mucociliary clearance to the upper airways where it can be coughed out if indicated.     Review the exercise prescription based on initial evaluation findings, including results and functional fitness testing.  Monitor pain levels and adjust the exercise prescription as needed.  Adjust supplemental oxygen according to the Supplemental Oxygen Policy to maintain SpO2 > 92% during exercise.  Develop a personalized home-based exercise program.  Discuss home exercise guidelines and review the Physical Activity log.  Educate on proper use of respiratory medications before exercise.  Teach paced breathing techniques for use during exercise and activities.  Discuss the impact of exercise on blood glucose levels and the optimal timing for exercise, if applicable.  Train patient on respiratory muscle training (RMT), if indicated.  Education on Benefits of Exercise - Review benefits and core components of exercise program, exercise guidelines and safety, RPD/RPE scales, breathing control strategies, warm up/cool down, equipment orientation, home exercise, O2 Use & safety, signs/symptoms to report. To be completed within 16 sessions.    30 Day Exercise Reassessment/Date:       Patient participated in # PR/ORS exercise sessions:  Patient able to complete #  minutes of endurance exercise with fewer or no rest breaks:  Patient shows increased strength and endurance in both upper and lower extremities.  Patient maintains a daily home exercise log and develops a consistent home exercise routine of at least # additional days per week:  Modifications are required due to: (HR, SpO2, BP, RPD/RPE, symptoms of exercise intolerance)  Exercise tolerance not progressing since initial assessment due to exacerbation (not requiring hospitalization, requiring hospitalization, pain, other)    Interventions:  Taught patient breathing retraining techniques with a return demonstration.  Educated patient on how to monitor SpO2 using a pulse oximeter and manage oxygen use if needed.  Provided guidance on the proper use of respiratory medications before exercise if necessary.  Instructed on paced breathing techniques for exercise and daily activities.  Taught patient how to monitor pain using a pain scale and adjust the exercise prescription as needed.  Explained the effects of exercise on blood glucose levels and the optimal exercise schedule, if needed.  Provided patient with respiratory muscle training (RMT) with a return demonstration.    Exercise progression:      Education  How the Lungs Work: ATS handouts along with review during Breathing Retraining/Airway Clearance education   Benefits of Exercise:    Progress toward Goals/Plan: Showing progress, Goals met, Goals not met  Exercise progression Rx:    60 Day Exercise Reassessment/Date:     Patient participated in # PR/ORS exercise sessions:  Patient able to complete # minutes of endurance exercise with fewer or no rest breaks:  Patient shows increased strength and endurance in both upper and lower extremities.  Patient maintains  a daily home exercise log and develops a consistent home exercise routine of at least # additional days per week:  Modifications are required due to: (HR, SpO2, BP, RPD/RPE, symptoms of exercise  intolerance)  Exercise tolerance not progressing since initial assessment due to exacerbation (not requiring hospitalization, requiring hospitalization, pain, other)    Interventions:  Taught patient breathing retraining techniques with a return demonstration.  Educated patient on how to monitor SpO2 using a pulse oximeter and manage oxygen use if needed.  Provided guidance on the proper use of respiratory medications before exercise if necessary.  Instructed on paced breathing techniques for exercise and daily activities.  Taught patient how to monitor pain using a pain scale and adjust the exercise prescription as needed.  Explained the effects of exercise on blood glucose levels and the optimal exercise schedule, if needed.  Provided patient with respiratory muscle training (RMT) with a return demonstration.    Exercise progression:      Education  How the Lungs Work: ATS handouts along with review during Breathing Retraining/Airway Clearance education   Benefits of Exercise:    Progress toward Goals/Plan: Showing progress, Goals met, Goals not met  Exercise progression Rx:    Exercise Discharge/Follow-up Date:   Patient participated in # PR/ORS exercise sessions.  Patient able to complete # minutes of endurance exercise with fewer or no rest breaks.  Patient shows increased strength in both upper and lower extremities.    Patient verbalizes/demonstrates how to:  Pursed lip breathing, paced breathing and diaphragmatic breathing during exercise and daily activities.  Use the home exercise log with the breathlessness scale, strength, and balance exercise handouts.  Monitor SpO2 with a pulse oximeter and manage oxygen use if needed.  Use respiratory medications properly before exercise if needed.  Monitor pain using a pain scale and adjust the exercise prescription as needed.  Manage the effects of exercise on blood glucose levels and determine the optimal exercise schedule if applicable.  Use respiratory muscle  training (RMT).    Patient attended education sessions on How the Lungs Work, Breathing Retraining Strategies, and the Benefits of Exercise.    Post (m):   Patient increased MET tolerance:  Reviewed home exercise plan with patient    Future plans to maintain gains achieved during PR/ORS:  Follow Home Exercise plan   Increase participation in physical activities  Health Management and Education Wellness classes  Join Virtual Maintenance exercise program  Exercise at nearby facility to patients home    Progress toward Goals: Goals met; Goals not met    NUTRITION ASSESSMENT  Initial Nutrition Assessment/Date: 10/31/2022  Body Composition  Admit Height: 72.6 inches  Admit Weight: 209 lbs.  Recent weight change: no   Patient would like to weight: 185   Difficulty with:  ? Shortness of breath during meals: - ? Shortness of breath after meals: -  Takes multivitamins: no  Takes other nutritional supplements: no, use to take protein shake when weight dropped to 145# years ago  Drinks alcohol: no    Patient review of nutrition habits/diet:   Has your illness or condition affected the kind and/or amount of food you eat? no   Do you eat fewer than 2 meals per day? no   Do you eat few fruits, vegetables, or milk products? no   Do you have tooth or mouth problems that make it hard for you to eat?  dentures   Are there times when you do not have enough money to  buy the food you need? no   Do you eat alone most of the time? yes   Do you take 3 or more different prescribed or over-the-counter drugs each day? yes   Without wanting to, have you lost or gained 10 or more pounds in the last 6 months? no   Are there times when you are not physically able to shop, cook, and/or feed yourself? no     Patient follows the following dietary Restrictions/Programs/Considerations? Reduce dairy products. Does not go downwell    Malnutrition Assessment (MNA) (Source: https://www.nestle.com)  Has food intake declined over the past 3 months due to  loss of appetite, digestive problems, chewing or swallowing difficulties: 2  0 = severe decrease in food intake  1 = moderate decrease in food intake  2 = no decrease in food intake  Weight loss during the past 3 months: 3  0 = greater than 3 kg (6.6 lbs)   1 = does not know  2 = weight loss between 1-3 kg (2.2 to 6.6 lbs.)  3 = no weight loss  Mobility: 2  0 = bed or chair bound  1 = able to get out of bed/chair but does not go out   2 = goes out  Has suffered psychological stress or acute disease in the past 3 months: 2  0 = yes  2 = no  Neuropsychological problems: 2  0 = severe dementia or depression  1 = mild dementia   2 = no psychological problems  BMI: 26.8 kg/m2: 3  0 = BMI less than 19  1 = BMI 19 to less than 21  2 = BMI 21 to less than 23  3 = BMI 23 or greater  Total MNA score: 14  (12-14 Normal nutrition status, 8-11 At risk of malnutrition, 0-7 Malnourished)    Knowledge deficit in management of:  Diabetes: No    A1C: -  CHF: No     Base weight: -  Osteoporosis: No   Vitamin D/ Calcium supplement: No    Nutrition Plan  Nutrition Goals  Maintain weight  Show progress to healthier BMI range 21-30 kg/m2  Enhance understanding of the role of exercise in weight control.  Increase knowledge of weight management while on prednisone, if applicable.  Improve self-management skills for diabetes, if relevant.  Improve self-management skills for congestive heart failure (CHF), if applicable.  Attend education on Nutrition education    Nutrition Interventions/Education  Monitor weight during PR/ORS program.  Review weight control strategies.  Provide education using a 24-hour or 3-day Food Diary for weight and nutrition monitoring.  Set goals for weight loss of 1-2 lbs/week, weight gain of 1-2 lbs/week, or weight maintenance as appropriate.  Refer to a registered dietitian (RD) as needed  Education on Nutrition - role of nutrition in managing chronic lung disease, follow diet plan, incorporating http://www.wall-moore.info/,  reading nutrition labels, portion control, importance of adequate fluid intake, strategies form managing breath during and after meals, strategies for making healthy dietary changes and strategies for increasing calories to encourage weight gain, role of supplements and prednisone. To be completed within 16 sessions.    30 Day Nutrition Reassessment/Date:   Current Weight (lbs.):  Maintaining weight  Weight gain:     Weight loss:    Interventions:  Monitored patients weight on weekly  basis  Provided patient with instructions on 24 hour or 3-day food diary.  Provided patient with information on the connection between dietary adherence, weight  management and exercise adherence.  Provided patient with booklet on Diabetes/CHF/Osteoporosis management  Refer to Health Management & Education or a registered dietitian (RD) as needed  Eating habits (quantity/type):  Review 24 hour or 3-day food diary.:  Changes to Dietary Restrictions/Programs/Considerations?  Changes to appetite, skipping meals, frequency of dining out, cooking, or grocery shopping?    Education: Nutrition Basics:    Progress toward Goals/Plan: Showing progress, Goals met, Goals not met  Encourage patient to completed 24 hour or 3-day food diary. Encourage to increase whole foods, food preparation at home and optimal hydration.    60 Day Nutrition Reassessment/Date:   Current Weight (lbs.):  Maintaining weight  Weight gain (lbs.):     Weight loss (lbs.):    Interventions:  Monitored patients weight on weekly  basis  Provided patient with instructions on 24 hour or 3-day food diary.  Provided patient with information on the connection between dietary adherence, weight management and exercise adherence.  Provided patient with booklet on Diabetes/CHF/Osteoporosis management  Refer to Health Management & Education or a registered dietitian (RD) as needed  Eating habits (quantity/type):  Review dietary questionnaire or 3-day food log:  Changes to Dietary  Restrictions/Programs/Considerations?  Changes to appetite, skipping meals, frequency of dining out, cooking, or grocery shopping?    Education: Nutrition Basics:    Progress toward Goals/Plan: Showing progress, Goals met, Goals not met  Encourage patient to completed 24 hour or 3-day food diary. Encourage to increase whole foods, food preparation at home and optimal hydration. Reinforce concepts taught in nutrition training.     Nutrition Discharge/Follow-up/Date:   Current Weight (lbs.):  Maintaining weight  Weight gain:     Weight loss:    Patient verbalizes/demonstrates understanding:  Connection between dietary adherence, weight management and exercise adherence.  Diabetes/CHF/Osteoporosis management.  Importance of optimal protein consumption to support bone health and muscle strength.  Importance of optimal fluid intake.    Future plans to maintain gains achieved during PR/ORS:  Health Management and Education Nutrition classes  Increase participation in physical activities  Health Management and Education Wellness classes  Join Virtual Maintenance exercise program  Exercise at nearby facility to patients home    Progress toward Goals: Goals met; Goals not met    PSYCHOSOCIAL ASSESSMENT  Initial Psychosocial Assessment/Date: 10/31/2022  PHQ9:  1.  Indicates a potential for minimal depression.  GAD7: 1. Indicates a potential for  minimal anxiety.  Patient currently receiving mental health counseling: No  Patient currently using anti-depression / anti-anxiety medications: N/A  Patient reports special concerns/stressors experiencing at this time: no   Concerns/stressors that apply: -     Major source(s) of emotional support (Name/Relationship): Family      Living situation:  Patient lives alone  Patients living situation: independent;  Patient reports social isolation related to symptoms of shortness of breath and/or use of supplemental oxygen: No  Patient transportation: son    Stage of change assessment:  Preparation.    Psychosocial Plan  Psychosocial Goals  Improvement in PHQ-9 score: Decrease to/Maintain < 5 PHQ9 score  Improvement in GAD-7 score: Decrease to/Maintain < 5 GAD score  Adherent with appointments with mental health counselor, as needed    Adherent with appointments with PCP for medication assistance, as needed   Adherence with anti-anxiety medications/anti-depression medications  Acquire new skills to cope with shortness of breath (SOB) and anxiety.  Develop new stress management techniques.  Report a subjective improvement in accepting the disease process and its management.  Attend  Emotional Health and Wellbeing education    Psychosocial Interventions/Education  Offer a variety of learning methods, including reading, note-taking, watching videos, discussing topics with others, hands-on experience, and interactive online resources.  Review screening tool results with the patient and notify the stress management counselor and physician if the PHQ-9 score is 10 or greater.  Engage the patient in continuous assessment of their psychosocial status.  Provide information about support groups and health system/community resources.  Encourage group participation and sharing while respecting privacy.  Inform the patient that their spouse or support person may attend education sessions.  Train the patient in relaxation techniques and/or stress management strategies.  Discuss how regular exercise, good nutrition, and adequate sleep contribute to stress management.  Refer the patient to Mental Health or Social Services for one-on-one counseling if needed.  Refer the patient to their primary care provider (PCP) for prescription assistance if needed.  Provide information on Health Management and Education classes  Education on Emotional Health and Wellbeing - coping techniques, relaxation techniques, breathing strategies to control dyspnea. To be completed within 16 sessions    30 Day Psychosocial  Reassessment/Date:   PHQ-9 score:  GAD score:  Patient verbalizes/demonstrates management of anxiety/depression.  Patient is satisfied with current support system.  Patient attends appointment with social services, mental health counselor, MD for medication assistance.  Patient adherent to Anti-depression medication, Adherent to anti-anxiety medication    Interventions:  Informed patient that their spouse or support person may attend education sessions.  Discussed how regular exercise, good nutrition, and adequate sleep contribute to stress management.  Provided information about support groups and health system/community resources.  Encouraged group participation and sharing while respecting privacy.    Education: Emotional Health and Well-being:    Progress toward Goals/Plan: Showing progress, Goals met, Goals not met  Continue to monitor PHQ9, GAD.   Reinforce tools to cope with SOB, anxiety and tools for stress management.    60 Day Psychosocial Reassessment/Date:   PHQ-9 score:  GAD score:  Patient verbalizes/demonstrates management of anxiety/depression.  Patient is satisfied with current support system.  Patient attends appointment with social services, mental health counselor, MD for medication assistance.  Patient adherent to Anti-depression medication, Adherent to anti-anxiety medication    Interventions:  Informed patient that their spouse or support person may attend education sessions.  Discussed how regular exercise, good nutrition, and adequate sleep contribute to stress management.  Provided information about support groups and health system/community resources.  Encouraged group participation and sharing while respecting privacy.    Education: Emotional Health and Well-being:    Progress toward Goals/Plan: Showing progress, Goals met, Goals not met  Continue to monitor PHQ9, GAD. Reinforce tools to cope with SOB, anxiety and tools for stress management.    Psychosocial Discharge/Follow-up/Date:    PHQ-9 score:  GAD score:    Patient verbalizes/demonstrates:  Acquired new skills to cope with shortness of breath (SOB) and anxiety.  Developed new stress management techniques.  Reports a subjective improvement in accepting the disease process and its management.  Adherent with appointments with mental health counselor, as needed    Adherent with appointments with PCP for medication assistance, as needed   Adherent with anti-anxiety medications/anti-depression medications    Education: Emotional Health and Well-being:    Future plans to maintain gains achieved during PR/ORS:  Health Management and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 Interventions/Education  Assess for dizziness and/or falls each session  Instruct on daily activity performance with pacing, dyspnea control with ADLs, exercise and stairs and incline  Instruct on balance exercises most sessions  Instruct on home safety and fall prevention  Instruct on assistive device evaluation, recommendations & resources   Education: Conservation of Energy - educate patient in pacing, pursed lip breathing and energy conservation techniques to improve ability to perform daily activities with less shortness of breath. To be completed within 16 sessions.     30 Day ADL Reassessment/Date:  [place] SOB:   CAT:  MMRC:   Patient shows an improvement in ability to perform exercise and daily  activities    Interventions:  Patient provided education on effective breathing, pacing and ramp and stair climbing techniques  Patient proved education/demonstration of appropriate use of walking aids for safety  Patient provided education on equipment safety and with balance exercises  Education: Optician, dispensing:    Progress toward Goals/Plan: Showing progress, Goals met, Goals not met  Continue to monitor patient progress with daily activity management with control of dyspnea.  Continue to monitor patient progress with safe participation in exercise class and use of exercise equipment.  Continue to monitor patient progress with balance exercises.    60 Day ADL Reassessment/Date:   [place] SOB:   CAT:  MMRC:   Patient shows an improvement in ability to perform exercise and daily activities    Interventions:  Patient provided education on effective breathing, pacing and ramp and stair climbing techniques  Patient proved education/demonstration of appropriate use of walking aids for safety  Patient provided education on equipment safety and with balance exercises    Education: Conservation of Energy:    Progress toward Goals/Plan: Showing progress, Goals met, Goals not met  Continue to monitor patient progress with daily activity management with control of dyspnea.  Continue to monitor patient progress with safe participation in exercise class and use of exercise equipment.  Continue to monitor patient progress with balance exercises.    ADL Discharge/Follow/Date:   [place] SOB:   CAT:  MMRC:     Patient verbalizes/demonstrates:  Proper use of energy conservation techniques with daily activities and exercise   Safe participation in exercise class and use of exercise equipment and with balance exercises.    Future plans to maintain gains achieved during PR/ORS:  Patient to continue practice of the 4 Ps: Planning, Posture, Pacing and PLB with ADLs and exercise  Patient to continue safe and appropriate use of  walking aids  Patient to continue to practice safety with balance exercises     Progress toward Goals: Goals met; Goals not met    Supportive Medicine ASSESSMENT  Initial Supportive Medicine Assessment/Date: yes  Patient has completed advance directive and POLST forms: yes and no  Patient understands difference between hospice and palliative care: No  Patient has had a discussion with health care provider most comfortable with: No    Palliative Care Plan  Palliative Care Goals  Identify patient symptoms to help manage them  Ensure patient is as comfortable and active as possible  Assist and support patient and family with difficult medical decisions  Coordinate care and treatment among doctors during all stages of illness  Identify services that can support patient and family after completing     Palliative Care Plan Interventions/Education  Refer to Supportive Medicine Clinic for consultation, if needed or requested  Education: Advance Care Planning - Discuss difference in hospice and palliative care, purpose of having an advance directive and POLST form completed and importance of having a discussion with health care provider most comfortable with. To be completed within 16 sessions.    30 Day Supportive Medicine Reassessment/Date:   Patient has completed advance directive and POLST forms  Patient has had a discussion with health care provider most comfortable with    Interventions:  Patient provided care coordination with patients healthcare team.  Patient provided education on respiratory symptoms and strategies to help manage them  Patient provided information on difference in hospice and palliative care  Patient provided information on our Supportive Medicine clinic    Education: Advance Care Planning:    Progress toward Goals/Plan: Showing progress, Goals met, Goals not met  Continue to monitor patient progress with managing symptoms.  Continue to coordinate care and treatment among patients healthcare  team.  Continue to assist and support patient and family with difficult  medical decisions referring to Supportive Medicine clinic.     60 Day Supportive Medicine Reassessment/Date:   Patient has completed advance directive and POLST forms  Patient has had a discussion with health care provider most comfortable with    Interventions:  Patient provided care coordination with patients healthcare team.  Patient provided education on respiratory symptoms and strategies to help manage them  Patient provided information on difference in hospice and palliative care  Patient provided information on our Supportive Medicine clinic    Education: Advance Care Planning:    Progress toward Goals/Plan: Showing progress, Goals met; Goals not met  Continue to monitor patient progress with managing symptoms.  Continue to coordinate care and treatment among patients healthcare team.  Continue to assist and support patient and family with difficult medical decisions referring to Supportive Medicine clinic.    Supportive Medicine Discharge/Final/Date:     Patient verbalizes/demonstrates:  Importance of having an advance directive and POLST forms  How to recognize worsening respiratory symptoms and strategies to help manage them  Understanding the difference in hospice and palliative care    Future plans to maintain gains achieved during PR/ORS:  Health Management and Education class on Advance Care Planning    Progress toward Goals: Goals met; Goals not met      Patient  Outcomes  Initial Assessment  Date: 10/31/2022 30 Day Reassessment        Date: 56 Day   Reassessment        Date: Discharge/Final           Date:               Distance (meters)   120  Held until Discharge/Final Held until Discharge/Final    MPH 0.75      Max HR  119      Low SpO2   93% on RA       RPD 3      RPE 3      METS 1.5      Functional Fitness       Bicep Curls 13      Sit to Stand 10      Frail 8/22      Body Mass        Height (inches) 72.6      Weight  (lbs.) 209       BMI (kg/m2) 26.8      Questionnaires        PHQ9  1      GAD7 1      [place] SOB 60      CAT (COPD & QoL) 21      ACT  15      STOP BANG 5      EPWORTH 5      MMRC 4          Initial Assessment signed by  Felicity Pellegrini, MHAL, BA, RRT-NPS, RCP, CES  Date: 10/31/2022    Initial Assessment/Medical Director  This patient was seen, evaluated and care plan was developed with the Pulmonary Rehabilitation Coordinator. I agree with the assessment and plan as outlined in her note.  Electronically signed: 10-31-2022  Ranae Plumber. Dorethea Clan MD

## 2022-10-28 NOTE — Progress Notes (Signed)
Pulmonary Rehabilitation Program   Intake Evaluation    Note Started: 10/28/2022, 10:29 AM  Date of Service: 10/31/2022  Location of Service: [address], [address]    Two patient identifiers verified:  Name, DOB  Check in time: 1330  Check out time: 1530    Session: 1 of 17    Upon arrival Kenneth Gordon was wearing a surgical mask to wear for the duration of the visit.  Patient denied the following symptoms: fever, cough, shortness of breath or difficulty breathing, chills, repeated shaking with chills muscle pain, headache, sore throat, new loss of taste or smell, diarrhea. Patient roomed in Exam room 12. Contact and Droplet precautions were followed when caring for the patient. PPE used by provider during encounter: Surgical mask.    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 Karie Chimera. Kenneth Gordon was fully evaluated by a Pulmonary Rehabilitation Program Coordinator. Mr. Chrostowski functional performance and home-based activity were assessed by the PRP Coordinator by interview and the following modalities:    Aerobic endurance using Six Minute Walk Test  Lower body strength using Chair sit-to-stand  Upper body strength using One arm bicep curl    Based on this evaluation it was determined that Kenneth Gordon should  be evaluated by the [facility] Medical Director for admission into the [facility] PRP.      Vitals   Pre-Exercise   Post-Exercise    Comments   Heart Rate 96 104    Respiratory Rate 18 18    SpO2  95% on RA 94% on RA    RPD/ RPE 0.5 / 0 0.5 / 0    Pain Rating (0-10) 0 / 10 0 / 10    Pain Location -  -    Right BP sitting 120/68  Cuff Size: medium   Left BP sitting 120/66 92/64    Left BP standing 128/68     Right BP standing 128/70     Blood Glucose NA NA    Temperature - Forehead infrared   Breath Sounds Clear and diminished       THR (220-age):   80% THR:   60% THR:   Cardiac Meds:   144 115  86 Lisinopril, Lipitor, Aspirin   Pulmonary Rehabilitation Exercise Prescription in Chronic Obstructive Pulmonary Disease: Review of Selected Guidelines   AN OFFICIAL STATEMENT FROM THE AMERICAN ASSOC[employer]ATION OF CARDIOVASCULAR AND PULMONARY REHABILITATION   [email or web address]      PFT / Date: 10/06/2017   Results   FVC 52%   FEV1 24%   FEV1/FVC 34%   TLC 79%   DLCO 39%   DLCO Adj 38%   MIP/MEP -                                                                                       Time: 1400-1415   Pulse Ox Type  MR5 Location  Forehead Oxygen  RA Oxygen Device  none   Walking Data   Start Time SpO2  HR RPD/RPE Comments   1 Minute  95% 114  Rest from 1.25 - 2.50   2 Minute 94% 117 3 / 3    3 Minute 97% 114     4 Minute 93% 119  3 / 3 Rest from 4.00-6.00   5 Minute 94% 119     6 Minute 97% 110 3 / 3 Patient reports "feet hot"   Distance  120 meters Rest Time  3.25 Walk Time  2.75 min Notes:  Patient used his 4 wheel walker to walk with and rest on.       Functional Fitness Tests                                                Time: 1445-1455     Bicep Curl    R / L hand   # Reps   RPD/RPE   SpO2 on LPM   HR     8#   8   2 / 2   98% on RA   109     Sit to Stands  No hands / hands   # Reps   RPD/RPE   SpO2 on LPM   HR   Own Body Weight (OBW)   10   4 / 4   96% on RA   118   Fall Risk Assessment         Frail test   8 / 22    Low Fall risk   Interventions:  High Risk >16 or Automatic High Risk Consider:      Review patient risk with Therapist, nutritional.  Notify PCP of patients fall risk score and recommend referral to Physical Therapy for Balance Assessment and Strengthening  Medium Risk 11-15 Consider:  Supervise patient ambulation as appropriate.   Low Risk 5-10 Consider:  Review strategies for preventing falls.   Automatic High-Risk Status (If any apply) Review with patient at every visit   Recent falls since last visit   Recent  change in functional status/medications affecting safe mobility.   Dizziness/Postural Hypotension      Publishing copy Joanette Gula and [place], 2001)  [email or web address]  Kaylyn Lim Fall Risk Assessment      Body Mass    Results   Height (inches) 72.6   Weight (lbs.) 209   BMI (kg/m2) 26.8     Questionnaires    Scores   PHQ9  P   GAD7 P   [place] SOB P   CAT (QOL) P   ACT (Asthma only) P   STOP BANG P   EPWORTH P   MMRC P     [id number] ([id number]) Outpatient PR without continuous oximetry - 2 session(s).     The complete evaluation on this patient required 2 hours     Report completed by: Felicity Pellegrini, MHAL, BA, RRT-NPS, RCP, CES      Supervising Physician: Dr. Melynda Keller

## 2022-10-31 ENCOUNTER — Ambulatory Visit: Payer: Medicare HMO | Attending: Internal Medicine

## 2022-10-31 ENCOUNTER — Telehealth: Payer: Self-pay

## 2022-10-31 DIAGNOSIS — J449 Chronic obstructive pulmonary disease, unspecified: Secondary | ICD-10-CM | POA: Insufficient documentation

## 2022-10-31 DIAGNOSIS — Z87891 Personal history of nicotine dependence: Secondary | ICD-10-CM | POA: Insufficient documentation

## 2022-10-31 NOTE — Telephone Encounter (Signed)
Pulmonary Rehabilitation Telephone Encounter     10/31/2022  9:56 AM    I have attempted to contact this patient with the following results: Three patient identifiers confirmed. Returning patient's phone call that he can not get passed the oxygen section on the initial intake forms. When talking with patient on the phone, there is no option for him to select "not on oxygen", options are O2 prescription for (check): Rest, activity, exercise, sleep, altitude. This does not apply to patient and he cannot submit or go to the next section of packet.     Apologized to patient that we are still troubleshooting the questionnaires since we recently rolled this out. Will follow-up with patient when he arrives in-person, maybe review on tablet. Patient appreciative of the phone call and will be in today at 1:30 PM.     Reinhold Rickey, BS, RRT  Pulmonary Rehabilitation Coordinator

## 2022-11-01 ENCOUNTER — Encounter: Payer: Self-pay | Admitting: Family Medicine

## 2022-11-01 DIAGNOSIS — J42 Unspecified chronic bronchitis: Secondary | ICD-10-CM

## 2022-11-05 ENCOUNTER — Ambulatory Visit
Admission: RE | Admit: 2022-11-05 | Discharge: 2022-11-05 | Disposition: A | Discharge status: Home / Self Care | Payer: Medicare HMO | Source: Ambulatory Visit | Attending: Family Medicine | Admitting: Family Medicine

## 2022-11-05 DIAGNOSIS — R140 Abdominal distension (gaseous): Secondary | ICD-10-CM

## 2022-11-05 DIAGNOSIS — I77811 Abdominal aortic ectasia: Secondary | ICD-10-CM

## 2022-11-05 DIAGNOSIS — I7143 Infrarenal abdominal aortic aneurysm, without rupture: Secondary | ICD-10-CM

## 2022-11-06 MED ORDER — SPIRIVA RESPIMAT 2.5 MCG/ACTUATION SOLUTION FOR INHALATION
2.0000 | Freq: Every day | RESPIRATORY_TRACT | 1 refills | Status: DC
Start: 2022-11-06 — End: 2023-04-11

## 2022-11-06 NOTE — Telephone Encounter (Signed)
Pharmacy Refill Optimization (PRO)    Refill authorized per P&T Protocol 11/06/2022    Bypassing Epic Protocol Determination (See Protocol Details for specific information)     Last visit is greater than 12 months, but within 18 months.   Transition fill ordered per protocol.     Scheduled appt 12/12/22  Future refills will be deferred to the provider if the patient does attend an appointment.     ====================================================================    Medication name: Tiotropium  Labs (if required by protocol): N/A

## 2022-11-07 ENCOUNTER — Telehealth: Payer: Self-pay | Admitting: Family Medicine

## 2022-11-07 ENCOUNTER — Other Ambulatory Visit: Payer: Self-pay

## 2022-11-07 ENCOUNTER — Encounter: Payer: Self-pay | Admitting: Family Medicine

## 2022-11-07 DIAGNOSIS — J449 Chronic obstructive pulmonary disease, unspecified: Secondary | ICD-10-CM

## 2022-11-07 NOTE — Telephone Encounter (Addendum)
Enhancing Lung Cancer Screening for Eligible Patients through Human Centered Approach (ELFE) Initiative    JAWANZA VOLESKY  [birth year]  65yr    Total pack years of smoking: 40   Smoking status: Former smoker, quit year 2022    After discussion and shared decision making:  The patient declined LDCT for lung cancer screening.    Abran Duke, [place]  Centralized PVP for [employer]

## 2022-11-08 NOTE — Telephone Encounter (Signed)
Thank you. Will also discuss at upcoming office visit

## 2022-11-09 ENCOUNTER — Encounter: Payer: Self-pay | Admitting: Family Medicine

## 2022-11-09 DIAGNOSIS — I10 Essential (primary) hypertension: Secondary | ICD-10-CM

## 2022-11-11 ENCOUNTER — Ambulatory Visit: Payer: Medicare HMO | Attending: Internal Medicine

## 2022-11-11 DIAGNOSIS — J449 Chronic obstructive pulmonary disease, unspecified: Secondary | ICD-10-CM | POA: Insufficient documentation

## 2022-11-11 NOTE — Pulmonary Rehab Treatment Plan (Unsigned)
INDIVIDUALIZED TREATMENT PLAN:   Pulmonary Rehabilitation/Outpatient Respiratory Therapy Services/Date: 10/31/2022  Kenneth Gordon is a 77yr old male with referring pulmonary diagnosis of COPD stage 4 Group E. Secondary pulmonary diagnosis included none. Kenneth Gordon was referred to the [facility]/Outpatient Respiratory Therapy service program by Dr. Karie Chimera.      Medical Necessity/Special Needs  Patients' pulmonary health history: Patient states he has more progressive SOB with moving about his home since 2021. Reports occasional cough, phlegm and chest tightness. His lung condition has greatly limited his activity level and is limited doing activities around the house. Mostly sedentray.  Pulmonary diagnostic tests completed: PFT,  CXR, A1AD = MM, IGE 75.8, RAST.  Cardiovascular tests completed: EKG, Echo, CT angioi, [place] LE, ABI    Smoking history:  Have you ever smoked or used tobacco? yes  Do you smoke currently? no  Year quit? 2021  Number packs per day? 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    Medical necessity: yes  Symptoms persistent despite medical therapy: yes  Increased AE/hospitalizations: no  Functional limits related to chronic lung disease: yes  HRQoL impairment: yes    Lab and test results reviewed with PR MD in PR Intake note DOS 07/31/2022 and reviewed on 10/28/2022.    PFT date: 10/06/2017  FVC 52%  FEV1 24% FEV1/FVC 34%  TLC 79% DLCO 39% Adj DLCO 38%    Patient Goals: Patient states have less SOB with exertion, be more independent, walk half a mile     What activities would you like to be able to do if you could? Walk longer distances with less SOB  What are your family's goals for you? Stay independent    EXERCISE ASSESSMENT  Initial Exercise Assessment/Date: 10/31/2022  Current exercise:  Patient currently exercises:  none  Limitations/Barriers to exercise: SOB, muscle fatigue, muscle cramping  Exercise equipment at home: no  Currently activity level [id number]     Initial :   Distance (meters): 120   Speed: 0.75  RPD/RPE: 3/3   METS: 1.5  Prior 6 MWD: 300   Completed: 2019.  Functional Fitness tests:   One arm bicep curl: 13 reps with 8# DB    RPD/RPE: 2/2  Sit to stand: 10 reps    RPD/RPE: 4/4  Fall risk score: 8/22  Fall risk: Low  Uses cane, walker/rollator or wheelchair: Yes 4 wheelwalker    Physical deconditioning: Yes  No regular exercise/sedentary lifestyle: Yes  Knowledge deficit of exercise guidelines and safety: Yes  Decreased exercise tolerance: Yes  Exercise-induced hypoxemia: No    Pain Assessment:  Pain Problems   Pain Score     Current treatment  ?Chest pain    -       -  ?Incision pain   -       -  ?Leg pain    4       Stop and rest  ?Muscle/joint pain   1-2       Movement for stiff joints      Other    -       -    Bone/Joint Problems  Pain Score Current treatment  ? Arthritis    -       -  ? Knee Surgery   -       -   ?  Back Surgery   -       -   ? Back Pain    -       -   ? Hip Replacement   -       -   ? Knee Replacement  -       -   ? Leg Vascular Surgery  4       surgery  ? Rotator Cuff    -       -  ? Shoulder Pain  -       -  ? Other    -       -    Exercise Plan  Exercise Goals:   Improve understanding of aerobic and resistance training and oxygen needs  Verbalize understanding of rate of perceived dyspnea (RPD) and rate of perceived exertion (RPE) as exercise guidelines for intensity.  Practice exercise prescription as advised by PR program coordinators under PR/ORS program medical director supervision.  Functional Capacity Goals:  Increase 6 MWD by > 30 meters  Increase physical activity > 20 min/day  Demonstrate proper dyspnea control with pursed lip breathing and pacing  Attend education on Benefits of Exercise    Exercise Interventions/Education  EXERCISE PRESCRIPTION  DO NOT EXERCISE PATIENT  WITH PRE-EXERCISE VITALS OF:  BP > 140 or < 90/50  HR > 100 or < 60   SpO2 < 90   RR > 24 or < 8      Begin the following exercise modalities 2-3 times per week for 8 weeks as follows:   Energy manager for MET level of 1.0 to 1.5. Increase up to 10%/week. Keep SpO2 >92%, HR between 86 to 115 bpm, or 4-6/10 on Borg CR 10 scale.   Treadmill: Begin at 0.8 - 1.0 mph for 5 to 20 minutes   Recumbent Stepper: Begin at load of 1 for 5 to 20 minutes   Recumbent Bicycle: Begin at level 1 for 5 to 20 minutes  Arm Ergometer: Begin at level 1 for 5 to 20 minutes   Over ground walking: Begin for 5 to 20 minutes, self-paced    Note: Progress individualized exercise program based on participant responses to exercise, including HR, SpO2, BP, Borg rating and symptoms of exercise tolerance. Exercise intensity will be progressed individually and titrated based on 4-6/10 on Borg CR 10 scale. Progression of exercise training volume will result from increases in time, intensity, and frequency. Initial emphasis will be on increasing time.    Resistance Training Increase up to 10% when RPE is < 4, as tolerated to keep 4-6/10 on Borg CR 10 scale.  Resistance bands/Free weights/Weight machine  Begin with yellow resistance band and 5# weight. 1-3 sets of 10-15 reps.  Bicep curls  Upright rows   Chest press  Back pull   Triceps extension  Reclined sit ups  Sit-to-Stands    Respiratory Muscle training   Frequency:  Twice daily, six days a week or as stated by your healthcare professional  Setting (I:E):  2/2  Time:  Strive to complete 3 sets of 10 repetitions  Type: Flow resistor    May order and administer Albuterol 0.083%, 2.5mg /2ml unit dose via hand-held nebulizer times 1 PRN for wheezing and shortness of breath. Notify referring physician if symptoms do not improve or additional bronchodilator treatments are needed.     May utilize supplemental oxygen 1-4 liters/min via nasal cannula during exercise to keep SpO2 greater than 92%.  NOTE:  if needed, may use high flow nasal cannula, simple mask, oxy mask, oxymizer oxygen conserver if more comfortable and if indicated.     May utilize [employer] Life2000 ventilation system. Mode: A/C, Vt = Rest/Low activity 150 mL, Medium activity 180 mL, High activity 200 mL, PEEP 0-5 cm H20, BR 0-20, I time 0.8-1.0 sec, Sensitivity 4. May adjust within +/- 75 mL of volume and/or O2 for each activity level as needed to maintain SpO2 >90% and adequate ventilation. Adjust to patient comfort for all Rxs.    May dispense and instruct patient on Oscillating PEP device as tolerated by HR, SpO2, RPD and RPE. PEP device opens weak or collapsed airways to mobilize and assist mucociliary clearance to the upper airways where it can be coughed out if indicated.     Review the exercise prescription based on initial evaluation findings, including results and functional fitness testing.  Monitor pain levels and adjust the exercise prescription as needed.  Adjust supplemental oxygen according to the Supplemental Oxygen Policy to maintain SpO2 > 92% during exercise.  Develop a personalized home-based exercise program.  Discuss home exercise guidelines and review the Physical Activity log.  Educate on proper use of respiratory medications before exercise.  Teach paced breathing techniques for use during exercise and activities.  Discuss the impact of exercise on blood glucose levels and the optimal timing for exercise, if applicable.  Train patient on respiratory muscle training (RMT), if indicated.  Education on Benefits of Exercise - Review benefits and core components of exercise program, exercise guidelines and safety, RPD/RPE scales, breathing control strategies, warm up/cool down, equipment orientation, home exercise, O2 Use & safety, signs/symptoms to report. To be completed within 16 sessions.    30 Day Exercise Reassessment/Date:       Patient participated in # PR/ORS exercise sessions:  Patient able to complete #  minutes of endurance exercise with fewer or no rest breaks:  Patient shows increased strength and endurance in both upper and lower extremities.  Patient maintains a daily home exercise log and develops a consistent home exercise routine of at least # additional days per week:  Modifications are required due to: (HR, SpO2, BP, RPD/RPE, symptoms of exercise intolerance)  Exercise tolerance not progressing since initial assessment due to exacerbation (not requiring hospitalization, requiring hospitalization, pain, other)    Interventions:  Taught patient breathing retraining techniques with a return demonstration.  Educated patient on how to monitor SpO2 using a pulse oximeter and manage oxygen use if needed.  Provided guidance on the proper use of respiratory medications before exercise if necessary.  Instructed on paced breathing techniques for exercise and daily activities.  Taught patient how to monitor pain using a pain scale and adjust the exercise prescription as needed.  Explained the effects of exercise on blood glucose levels and the optimal exercise schedule, if needed.  Provided patient with respiratory muscle training (RMT) with a return demonstration.    Exercise progression:  11/11/22 - Patient tolerated first day of group exercise. NS load 1 for 15 min and UBE with yellow band 1 x 10 reps.    Education  How the Lungs Work: ATS handouts along with review during Breathing Retraining/Airway Clearance education   Benefits of Exercise:    Progress toward Goals/Plan: Showing progress, Goals met, Goals not met  Exercise progression Rx:    60 Day Exercise Reassessment/Date:     Patient participated in # PR/ORS exercise sessions:  Patient able to complete # minutes of endurance  exercise with fewer or no rest breaks:  Patient shows increased strength and endurance in both upper and lower extremities.  Patient maintains a daily home exercise log and develops a consistent home exercise routine of at least #  additional days per week:  Modifications are required due to: (HR, SpO2, BP, RPD/RPE, symptoms of exercise intolerance)  Exercise tolerance not progressing since initial assessment due to exacerbation (not requiring hospitalization, requiring hospitalization, pain, other)    Interventions:  Taught patient breathing retraining techniques with a return demonstration.  Educated patient on how to monitor SpO2 using a pulse oximeter and manage oxygen use if needed.  Provided guidance on the proper use of respiratory medications before exercise if necessary.  Instructed on paced breathing techniques for exercise and daily activities.  Taught patient how to monitor pain using a pain scale and adjust the exercise prescription as needed.  Explained the effects of exercise on blood glucose levels and the optimal exercise schedule, if needed.  Provided patient with respiratory muscle training (RMT) with a return demonstration.    Exercise progression:      Education  How the Lungs Work: ATS handouts along with review during Breathing Retraining/Airway Clearance education   Benefits of Exercise:    Progress toward Goals/Plan: Showing progress, Goals met, Goals not met  Exercise progression Rx:    Exercise Discharge/Follow-up Date:   Patient participated in # PR/ORS exercise sessions.  Patient able to complete # minutes of endurance exercise with fewer or no rest breaks.  Patient shows increased strength in both upper and lower extremities.    Patient verbalizes/demonstrates how to:  Pursed lip breathing, paced breathing and diaphragmatic breathing during exercise and daily activities.  Use the home exercise log with the breathlessness scale, strength, and balance exercise handouts.  Monitor SpO2 with a pulse oximeter and manage oxygen use if needed.  Use respiratory medications properly before exercise if needed.  Monitor pain using a pain scale and adjust the exercise prescription as needed.  Manage the effects of exercise on  blood glucose levels and determine the optimal exercise schedule if applicable.  Use respiratory muscle training (RMT).    Patient attended education sessions on How the Lungs Work, Breathing Retraining Strategies, and the Benefits of Exercise.    Post (m):   Patient increased MET tolerance:  Reviewed home exercise plan with patient    Future plans to maintain gains achieved during PR/ORS:  Follow Home Exercise plan   Increase participation in physical activities  Health Management and Education Wellness classes  Join Virtual Maintenance exercise program  Exercise at nearby facility to patients home    Progress toward Goals: Goals met; Goals not met    NUTRITION ASSESSMENT  Initial Nutrition Assessment/Date: 10/31/2022  Body Composition  Admit Height: 72.6 inches  Admit Weight: 209 lbs.  Recent weight change: no   Patient would like to weight: 185   Difficulty with:  ? Shortness of breath during meals: - ? Shortness of breath after meals: -  Takes multivitamins: no  Takes other nutritional supplements: no, use to take protein shake when weight dropped to 145# years ago  Drinks alcohol: no    Patient review of nutrition habits/diet:   Has your illness or condition affected the kind and/or amount of food you eat? no   Do you eat fewer than 2 meals per day? no   Do you eat few fruits, vegetables, or milk products? no   Do you have tooth or mouth problems  that make it hard for you to eat?  dentures   Are there times when you do not have enough money to buy the food you need? no   Do you eat alone most of the time? yes   Do you take 3 or more different prescribed or over-the-counter drugs each day? yes   Without wanting to, have you lost or gained 10 or more pounds in the last 6 months? no   Are there times when you are not physically able to shop, cook, and/or feed yourself? no     Patient follows the following dietary Restrictions/Programs/Considerations? Reduce dairy products. Does not go downwell    Malnutrition  Assessment (MNA) (Source: https://www.nestle.com)  Has food intake declined over the past 3 months due to loss of appetite, digestive problems, chewing or swallowing difficulties: 2  0 = severe decrease in food intake  1 = moderate decrease in food intake  2 = no decrease in food intake  Weight loss during the past 3 months: 3  0 = greater than 3 kg (6.6 lbs)   1 = does not know  2 = weight loss between 1-3 kg (2.2 to 6.6 lbs.)  3 = no weight loss  Mobility: 2  0 = bed or chair bound  1 = able to get out of bed/chair but does not go out   2 = goes out  Has suffered psychological stress or acute disease in the past 3 months: 2  0 = yes  2 = no  Neuropsychological problems: 2  0 = severe dementia or depression  1 = mild dementia   2 = no psychological problems  BMI: 26.8 kg/m2: 3  0 = BMI less than 19  1 = BMI 19 to less than 21  2 = BMI 21 to less than 23  3 = BMI 23 or greater  Total MNA score: 14  (12-14 Normal nutrition status, 8-11 At risk of malnutrition, 0-7 Malnourished)    Knowledge deficit in management of:  Diabetes: No    A1C: -  CHF: No     Base weight: -  Osteoporosis: No   Vitamin D/ Calcium supplement: No    Nutrition Plan  Nutrition Goals  Maintain weight  Show progress to healthier BMI range 21-30 kg/m2  Enhance understanding of the role of exercise in weight control.  Increase knowledge of weight management while on prednisone, if applicable.  Improve self-management skills for diabetes, if relevant.  Improve self-management skills for congestive heart failure (CHF), if applicable.  Attend education on Nutrition education    Nutrition Interventions/Education  Monitor weight during PR/ORS program.  Review weight control strategies.  Provide education using a 24-hour or 3-day Food Diary for weight and nutrition monitoring.  Set goals for weight loss of 1-2 lbs/week, weight gain of 1-2 lbs/week, or weight maintenance as appropriate.  Refer to a registered dietitian (RD) as needed  Education on  Nutrition - role of nutrition in managing chronic lung disease, follow diet plan, incorporating http://www.wall-moore.info/, reading nutrition labels, portion control, importance of adequate fluid intake, strategies form managing breath during and after meals, strategies for making healthy dietary changes and strategies for increasing calories to encourage weight gain, role of supplements and prednisone. To be completed within 16 sessions.    30 Day Nutrition Reassessment/Date:   Current Weight (lbs.):  Maintaining weight  Weight gain:     Weight loss:    Interventions:  Monitored patients weight on weekly  basis  Provided patient with instructions on 24 hour or 3-day food diary.  Provided patient with information on the connection between dietary adherence, weight management and exercise adherence.  Provided patient with booklet on Diabetes/CHF/Osteoporosis management  Refer to Health Management & Education or a registered dietitian (RD) as needed  Eating habits (quantity/type):  Review 24 hour or 3-day food diary.:  Changes to Dietary Restrictions/Programs/Considerations?  Changes to appetite, skipping meals, frequency of dining out, cooking, or grocery shopping?    Education: Nutrition Basics:    Progress toward Goals/Plan: Showing progress, Goals met, Goals not met  Encourage patient to completed 24 hour or 3-day food diary. Encourage to increase whole foods, food preparation at home and optimal hydration.    60 Day Nutrition Reassessment/Date:   Current Weight (lbs.):  Maintaining weight  Weight gain (lbs.):     Weight loss (lbs.):    Interventions:  Monitored patients weight on weekly  basis  Provided patient with instructions on 24 hour or 3-day food diary.  Provided patient with information on the connection between dietary adherence, weight management and exercise adherence.  Provided patient with booklet on Diabetes/CHF/Osteoporosis management  Refer to Health Management & Education or a registered dietitian (RD) as  needed  Eating habits (quantity/type):  Review dietary questionnaire or 3-day food log:  Changes to Dietary Restrictions/Programs/Considerations?  Changes to appetite, skipping meals, frequency of dining out, cooking, or grocery shopping?    Education: Nutrition Basics:    Progress toward Goals/Plan: Showing progress, Goals met, Goals not met  Encourage patient to completed 24 hour or 3-day food diary. Encourage to increase whole foods, food preparation at home and optimal hydration. Reinforce concepts taught in nutrition training.     Nutrition Discharge/Follow-up/Date:   Current Weight (lbs.):  Maintaining weight  Weight gain:     Weight loss:    Patient verbalizes/demonstrates understanding:  Connection between dietary adherence, weight management and exercise adherence.  Diabetes/CHF/Osteoporosis management.  Importance of optimal protein consumption to support bone health and muscle strength.  Importance of optimal fluid intake.    Future plans to maintain gains achieved during PR/ORS:  Health Management and Education Nutrition classes  Increase participation in physical activities  Health Management and Education Wellness classes  Join Virtual Maintenance exercise program  Exercise at nearby facility to patients home    Progress toward Goals: Goals met; Goals not met    PSYCHOSOCIAL ASSESSMENT  Initial Psychosocial Assessment/Date: 10/31/2022  PHQ9:  1.  Indicates a potential for minimal depression.  GAD7: 1. Indicates a potential for  minimal anxiety.  Patient currently receiving mental health counseling: No  Patient currently using anti-depression / anti-anxiety medications: N/A  Patient reports special concerns/stressors experiencing at this time: no   Concerns/stressors that apply: -     Major source(s) of emotional support (Name/Relationship): Family      Living situation:  Patient lives alone  Patients living situation: independent;  Patient reports social isolation related to symptoms of shortness of  breath and/or use of supplemental oxygen: No  Patient transportation: son    Stage of change assessment: Preparation.    Psychosocial Plan  Psychosocial Goals  Improvement in PHQ-9 score: Decrease to/Maintain < 5 PHQ9 score  Improvement in GAD-7 score: Decrease to/Maintain < 5 GAD score  Adherent with appointments with mental health counselor, as needed    Adherent with appointments with PCP for medication assistance, as needed   Adherence with anti-anxiety medications/anti-depression medications  Acquire new skills to cope with shortness of breath (SOB)  and anxiety.  Develop new stress management techniques.  Report a subjective improvement in accepting the disease process and its management.  Attend Emotional Health and Wellbeing education    Psychosocial Interventions/Education  Offer a variety of learning methods, including reading, note-taking, watching videos, discussing topics with others, hands-on experience, and interactive online resources.  Review screening tool results with the patient and notify the stress management counselor and physician if the PHQ-9 score is 10 or greater.  Engage the patient in continuous assessment of their psychosocial status.  Provide information about support groups and health system/community resources.  Encourage group participation and sharing while respecting privacy.  Inform the patient that their spouse or support person may attend education sessions.  Train the patient in relaxation techniques and/or stress management strategies.  Discuss how regular exercise, good nutrition, and adequate sleep contribute to stress management.  Refer the patient to Mental Health or Social Services for one-on-one counseling if needed.  Refer the patient to their primary care provider (PCP) for prescription assistance if needed.  Provide information on Health Management and Education classes  Education on Emotional Health and Wellbeing - coping techniques, relaxation techniques, breathing  strategies to control dyspnea. To be completed within 16 sessions    30 Day Psychosocial Reassessment/Date:   PHQ-9 score:  GAD score:  Patient verbalizes/demonstrates management of anxiety/depression.  Patient is satisfied with current support system.  Patient attends appointment with social services, mental health counselor, MD for medication assistance.  Patient adherent to Anti-depression medication, Adherent to anti-anxiety medication    Interventions:  Informed patient that their spouse or support person may attend education sessions.  Discussed how regular exercise, good nutrition, and adequate sleep contribute to stress management.  Provided information about support groups and health system/community resources.  Encouraged group participation and sharing while respecting privacy.    Education: Emotional Health and Well-being:    Progress toward Goals/Plan: Showing progress, Goals met, Goals not met  Continue to monitor PHQ9, GAD.   Reinforce tools to cope with SOB, anxiety and tools for stress management.    60 Day Psychosocial Reassessment/Date:   PHQ-9 score:  GAD score:  Patient verbalizes/demonstrates management of anxiety/depression.  Patient is satisfied with current support system.  Patient attends appointment with social services, mental health counselor, MD for medication assistance.  Patient adherent to Anti-depression medication, Adherent to anti-anxiety medication    Interventions:  Informed patient that their spouse or support person may attend education sessions.  Discussed how regular exercise, good nutrition, and adequate sleep contribute to stress management.  Provided information about support groups and health system/community resources.  Encouraged group participation and sharing while respecting privacy.    Education: Emotional Health and Well-being:    Progress toward Goals/Plan: Showing progress, Goals met, Goals not met  Continue to monitor PHQ9, GAD. Reinforce tools to cope with  SOB, anxiety and tools for stress management.    Psychosocial Discharge/Follow-up/Date:   PHQ-9 score:  GAD score:    Patient verbalizes/demonstrates:  Acquired new skills to cope with shortness of breath (SOB) and anxiety.  Developed new stress management techniques.  Reports a subjective improvement in accepting the disease process and its management.  Adherent with appointments with mental health counselor, as needed    Adherent with appointments with PCP for medication assistance, as needed   Adherent with anti-anxiety medications/anti-depression medications    Education: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 exercise and use of exercise equipment  Patient demonstrates balance exercises on balance handout  Patient attends education on Conservation of Energy    ADL Interventions/Education  Assess for dizziness and/or falls each session  Instruct on daily activity performance with pacing, dyspnea control with ADLs, exercise and stairs and incline  Instruct on balance exercises most sessions  Instruct on home safety and fall prevention  Instruct on assistive device evaluation, recommendations & resources   Education: Conservation of Energy - educate patient in pacing, pursed lip breathing and energy conservation techniques to improve ability to perform daily activities with less shortness of breath. To be completed within 16 sessions.      30 Day ADL Reassessment/Date:  [place] SOB:   CAT:  MMRC:   Patient shows an improvement in ability to perform exercise and daily activities    Interventions:  Patient provided education on  effective breathing, pacing and ramp and stair climbing techniques  Patient proved education/demonstration of appropriate use of walking aids for safety  Patient provided education on equipment safety and with balance exercises    Education: Conservation of Energy:    Progress toward Goals/Plan: Showing progress, Goals met, Goals not met  Continue to monitor patient progress with daily activity management with control of dyspnea.  Continue to monitor patient progress with safe participation in exercise class and use of exercise equipment.  Continue to monitor patient progress with balance exercises.    60 Day ADL Reassessment/Date:   [place] SOB:   CAT:  MMRC:   Patient shows an improvement in ability to perform exercise and daily activities    Interventions:  Patient provided education on effective breathing, pacing and ramp and stair climbing techniques  Patient proved education/demonstration of appropriate use of walking aids for safety  Patient provided education on equipment safety and with balance exercises    Education: Conservation of Energy:    Progress toward Goals/Plan: Showing progress, Goals met, Goals not met  Continue to monitor patient progress with daily activity management with control of dyspnea.  Continue to monitor patient progress with safe participation in exercise class and use of exercise equipment.  Continue to monitor patient progress with balance exercises.    ADL Discharge/Follow/Date:   [place] SOB:   CAT:  MMRC:     Patient verbalizes/demonstrates:  Proper use of energy conservation techniques with daily activities and exercise   Safe participation in exercise class and use of exercise equipment and with balance exercises.    Future plans to maintain gains achieved during PR/ORS:  Patient to continue  practice of the 4 Ps: Planning, Posture, Pacing and PLB with ADLs and exercise  Patient to continue safe and appropriate use of walking aids  Patient to continue to practice safety with balance exercises     Progress toward Goals: Goals met; Goals not met    Supportive Medicine ASSESSMENT  Initial Supportive Medicine Assessment/Date: yes  Patient has completed advance directive and POLST forms: yes and no  Patient understands difference between hospice and palliative care: No  Patient has had a discussion with health care provider most comfortable with: No    Palliative Care Plan  Palliative Care Goals  Identify patient symptoms to help manage them  Ensure patient is as comfortable and active as possible  Assist and support patient and family with difficult medical decisions  Coordinate care and treatment among doctors during all stages of illness  Identify services that can support patient and family after completing     Palliative Care Plan Interventions/Education  Refer to Supportive Medicine Clinic for consultation, if needed or requested  Education: Advance Care Planning - Discuss difference in hospice and palliative care, purpose of having an advance directive and POLST form completed and importance of having a discussion with health care provider most comfortable with. To be completed within 16 sessions.    30 Day Supportive Medicine Reassessment/Date:   Patient has completed advance directive and POLST forms  Patient has had a discussion with health care provider most comfortable with    Interventions:  Patient provided care coordination with patients healthcare team.  Patient provided education on respiratory symptoms and strategies to help manage them  Patient provided information on difference in hospice and palliative care  Patient provided information on our Supportive Medicine clinic    Education: Advance Care Planning:    Progress toward Goals/Plan: Showing progress, Goals met,  Goals not met  Continue  to monitor patient progress with managing symptoms.  Continue to coordinate care and treatment among patients healthcare team.  Continue to assist and support patient and family with difficult medical decisions referring to Supportive Medicine clinic.     60 Day Supportive Medicine Reassessment/Date:   Patient has completed advance directive and POLST forms  Patient has had a discussion with health care provider most comfortable with    Interventions:  Patient provided care coordination with patients healthcare team.  Patient provided education on respiratory symptoms and strategies to help manage them  Patient provided information on difference in hospice and palliative care  Patient provided information on our Supportive Medicine clinic    Education: Advance Care Planning:    Progress toward Goals/Plan: Showing progress, Goals met; Goals not met  Continue to monitor patient progress with managing symptoms.  Continue to coordinate care and treatment among patients healthcare team.  Continue to assist and support patient and family with difficult medical decisions referring to Supportive Medicine clinic.    Supportive Medicine Discharge/Final/Date:     Patient verbalizes/demonstrates:  Importance of having an advance directive and POLST forms  How to recognize worsening respiratory symptoms and strategies to help manage them  Understanding the difference in hospice and palliative care    Future plans to maintain gains achieved during PR/ORS:  Health Management and Education class on Advance Care Planning    Progress toward Goals: Goals met; Goals not met      Patient  Outcomes  Initial Assessment  Date: 10/31/2022 30 Day Reassessment        Date: 22 Day   Reassessment        Date: Discharge/Final           Date:               Distance (meters)   120  Held until Discharge/Final Held until Discharge/Final    MPH 0.75      Max HR  119      Low SpO2   93% on RA       RPD 3      RPE 3      METS 1.5      Functional  Fitness       Bicep Curls 13      Sit to Stand 10      Frail 8/22      Body Mass        Height (inches) 72.6      Weight (lbs.) 209       BMI (kg/m2) 26.8      Questionnaires        PHQ9  1      GAD7 1      [place] SOB 60      CAT (COPD & QoL) 21      ACT  15      STOP BANG 5      EPWORTH 5      MMRC 4          Initial Assessment signed by  Felicity Pellegrini, MHAL, BA, RRT-NPS, RCP, CES  Date: 10/31/2022    Initial Assessment/Medical Director  This patient was seen, evaluated and care plan was developed with the Pulmonary Rehabilitation Coordinator. I agree with the assessment and plan as outlined in her note.  Electronically signed: 10-31-2022  Ranae Plumber. Dorethea Clan MD

## 2022-11-11 NOTE — Progress Notes (Unsigned)
Pulmonary Rehabilitation/Outpatient Respiratory Services Session Progress Note     Note Started: 11/11/2022, 5:09 PM  Date of Service: 11/11/2022  Location of Service: [address], [address]    Diagnosis: COPD stage 4 group E    Session 2 of 17    Check in: 1030-1035  Cardio exercises: 1120-1135 (15 minutes)  Strength exercises: 1140-1155 (15 minutes)  Balance/Stretch/Cool Down: 1155-1200 (5 minutes)  1 on 1 Education Time: -   Check out: 1200-1250  Total time: 95 minutes     Lecturer:  Mallorie Mullendore, BS, RRT, RCP  Education: Introduction and Program overview of Pulmonary Rehabilitation  [name] was introduced to staff and other participants of the pulmonary rehabilitation class.  We covered parking/transportation, infection prevention, program format and safety check list for operating the exercise equipment and signs/symptoms to report and stop exercise. Emergency procedures were reviewed including the location of the fire exit and evacuation procedures.  We also discussed the class schedule and the education materials we will be using for the next eight weeks. Dr. Dorethea Clan reviewed telephone encounter on Kenneth Gordon 07/31/22 and gave an overview of the pulmonary rehabilitation program and what benefit can expect to gain after completing the eight week program. Kenneth Gordon was engaged and asked many questions.    Barriers to Learning assessed: none  Patient verbalizes understanding of teaching and instructions.     Assessment: A new surgical mask/face shield was provided to the patient upon arrival. Contact or droplet precautions were followed when caring for the patient. PPE used by provider during encounter: surgical mask/face shield.    Patients son brought patient to appointment.    Warm-up exercises were well-tolerated. VS within MD parameters. Kenneth Gordon tolerated all exercises well.    Check in  New symptoms: none  Dr.visits: pulmonary  Pain rating (0-10): 0/10  Pain location: -  Medication  changes: no  Weight: 91.3 kg  Temperature: 97.3 F    Pre-exercise vital signs  SpO2: 96% on RA  HR: 92  RPD: 0/10  RPE: 0/10  BP: 130/70  RR: 18  Peak flow: NA  BG: NA  Br/S: Clear and diminished  Pre-exercise bronchodilator used: no    Post-exercise vital signs  SpO2: 97% on RA  HR: 95  RPD: 0.5/10  RPE: 0.5/10  BP: 138/78  RR: 20  Peak flow: NA  BG: NA    EXERCISE SESSION    Continuous pulse oximetry during session: no  Intermittent pulse oximetry during session: yes  Exercise modifications: none      Cardio exercises - Training - continuous         Recumbent stepper setting: Load/level: 1 for 15 minutes.  BP 130/72, SpO2 97% on RA, HR 99, RPD/RPE 3 / 3  METs achieved: 1.6 Distance: 0.53 miles      Strength exercises            Warm up upper/lower body: 5 minutes  Strength exercises: 1 sets x 10 reps with yellow resistance band.  Rating of perceived dyspnea and exertion (RPD/RPE) on the following exercises:    Upper body exercises  Biceps: - / -  Shoulders: 0.5 / 1  Chest: 1 / 1  Back: 1 / 1  Triceps: - / -    Core exercises - 1 set x 10 reps  Reclined sit ups: 0.5 / 1    Lower body exercises - 1 set x - reps  Sit-to-stands: - / -  Ankle exercises  Stretch exercises/cool down - 1 set x - reps        Balance -Dynamic and static: - / -  Stretches - calf, quad, hamstring, low back     Plan: Patient continues to require current level of care. Continue to monitor SpO2 at rest and with exertion. Monitor proper pace and cue pursed-lip breathing techniques as needed. Review Home Exercise log with patient. Patient showing progress.    Maintain recumbent stepper workload to 1 and Increase time for 20 minutes as tolerated by HR, SpO2, RPD and RPE rating.1    Increase bicep curls at 5# and Increase sets and reps 2 x 10 as tolerated by HR, SpO2, RPD and RPE rating.    Maintain upright row, chest, back and triceps exercises to yellow band and Increase 2 sets x 10 reps as tolerated by HR, SpO2, RPD and RPE rating.    Maintain  static and/or dynamic balance exercises to 1 sets x 10-15 seconds/reps as tolerated by HR, SpO2, RPD and RPE rating.    [id number] ([id number]) Outpatient PR without continuous oximetry - 2 session(s).      Report Completed by Felicity Pellegrini, MHAL, BA, RRT-NPS, RCP, CES      Supervising physician:  Dr. Magnus Ivan

## 2022-11-13 ENCOUNTER — Telehealth: Payer: Self-pay

## 2022-11-13 ENCOUNTER — Ambulatory Visit: Payer: Medicare HMO

## 2022-11-13 NOTE — Telephone Encounter (Signed)
Pulmonary Rehabilitation Telephone Encounter     11/13/2022  12:36 PM    Patient called pulmonary rehab with the following results: Patient identification confirmed using 3 patient identifiers (i.e. Name, DOB, MRN, last 4 of SSN, and/or address). Patient called to cancel his appointment today due to constipation. He stated his PCP is aware.    Felicity Pellegrini, MHAL, BA, RRT-NPS, RCP, CES  Pulmonary Rehabilitation Program Supervisor  [phone]

## 2022-11-14 ENCOUNTER — Other Ambulatory Visit: Payer: Self-pay | Admitting: Family Medicine

## 2022-11-14 DIAGNOSIS — E782 Mixed hyperlipidemia: Secondary | ICD-10-CM

## 2022-11-14 MED ORDER — LISINOPRIL 30 MG TABLET
30.0000 mg | ORAL_TABLET | Freq: Every day | ORAL | 0 refills | Status: DC
Start: 2022-11-14 — End: 2023-02-10

## 2022-11-14 NOTE — Telephone Encounter (Signed)
Pharmacy Refill Optimization (PRO)    Refill authorized per P&T Protocol 11/14/2022    Bypassing Epic Protocol Determination (See Protocol Details for specific information)     Last visit is greater than 12 months, but within 18 months.   Lab(s) overdue. Overdue lab ordered per protocol. Lab compliance letter has been issued.     Future refills will require provider authorization if patient fails to schedule and attend an appointment and draw overdue lab.  Encounter will be routed to provider if additional labs are necessary, Future appointment scheduled for 12/12/2022.       ====================================================================    Medication name: LISINOPRIL    NOV: 12/12/2022  LOV: 10/22/2021    Labs (if required by protocol):   Lab Results   Component Value Date    NA 139 10/22/2021    NA 134 (L) 04/08/2019    K 4.2 10/22/2021    K 4.7 04/08/2019    CA 9.6 10/22/2021    CA 9.6 04/08/2019    CR 1.00 10/22/2021    CR 1.00 04/08/2019    GFR 78 10/22/2021    GFR 87 04/08/2019    GFR 75 04/08/2019    BUN 10 10/22/2021    BUN 10 04/08/2019

## 2022-11-15 ENCOUNTER — Ambulatory Visit: Payer: Medicare HMO | Attending: Internal Medicine

## 2022-11-15 DIAGNOSIS — J449 Chronic obstructive pulmonary disease, unspecified: Secondary | ICD-10-CM

## 2022-11-15 NOTE — Progress Notes (Signed)
Zoom Video Visit Note   Pulmonary Rehabilitation/Outpatient Respiratory Services Session Progress Note     Video Visit for Pulmonary Rehabilitation  I performed this clinical encounter by utilizing a real time telehealth video connection between my location and the patient's location due to COVID-19 Shelter-in recommended by the government. The patient's location was confirmed during this visit. I obtained verbal consent from the patient to perform this clinical encounter utilizing video and prepared the patient by answering any questions they had about the telehealth interaction.    Note Started: 11/15/2022, 12:33 PM  Date of Service: 11/15/22      Name: Kenneth Gordon  Diagnosis:  COPD stage 4 group E  Video visit via Zoom conducted from 11:00 - 12:30 PM    Educator:   Mallorie Mullendore, BS, RRT, RCP  Education: Breathing Retraining and Airway Clearance  Today we reviewed lung anatomy and how it relates to breathing retraining.  We discussed three main factors of shortness of breath:  1) what activities may cause their shortness of breath, 2) other potential causes of shortness of breath, 3) the dyspnea cycle, or downward spiral.  The concept of "supply and demand" was taught and how to use breathing retraining tools to relieve dyspnea. The technique of pursed lip breathing (PLB), diaphragmatic breathing and pace breathing were demonstrated and practiced. The class was taught to stop, think, get into a rescue position, rate dyspnea using the RPD scale and practice pursed lip breathing. It was explained that PLB would aid in removing air that is trapped in the lungs and may decrease shortness of breath with any chronic lung disease.  Airway clearance tools were demonstrated with the use of the [place]. The huff cough was also taught to aid in secretion clearance.  The class was shown postural drainage positions and taught how this may aid in clearing secretions with the use of gravity. Lorrin was able to  demonstrate these techniques during the session today and will be asked to demonstrate throughout the program. Lev will practice these techniques at home twice a day.    Educator:   Felicity Pellegrini, MHAL, BA, RRT-NPS, RCP  Education: Conservation of Energy  Today Kenneth Gordon was given information on understanding energy conservation to enable male to do more with less shortness of breath, fatigue, and risk of injury. One of the concepts taught was to "move smarter, not harder" by moving at a slower pace, not to holding their breath, and using pursed lip breathing as they move. Ideas on how to approach daily activities include the six P's:  Planning, Prioritizing, Positioning and Pacing, Pursed lip breathing and Positive attitude.  We demonstrated energy conservation during sitting to standing, climbing stairs, lifting and carrying, pushing and pulling. [name] asked questions and was engaged during the class.      Report Completed by    Julieta Bellini, BS, RRT-ACCS, RCP  Pulmonary Rehabilitation Program Coordinator  Pulmonary Disease Educator      Supervising physician: Dr. Kem Kays

## 2022-11-18 ENCOUNTER — Ambulatory Visit: Payer: Medicare HMO | Attending: Internal Medicine

## 2022-11-18 DIAGNOSIS — J449 Chronic obstructive pulmonary disease, unspecified: Secondary | ICD-10-CM | POA: Insufficient documentation

## 2022-11-18 NOTE — Progress Notes (Signed)
Pulmonary Rehabilitation/Outpatient Respiratory Services Session Progress Note     Note Started: 11/18/2022, 2:55 PM  Date of Service: 11/18/2022  Location of Service: [address], [address]    Diagnosis: COPD stage 4 group E    Session 3 of 17    Check in: 1025-1035  Cardio exercises: 1050-1110 (20 minutes)  Strength exercises: 1125-1200 (35 minutes)  Balance/Stretch/Cool Down: 1200-1210 (10 minutes)  1 on 1 Education Time: -   Check out: 1210-1215  Total time: 110 minutes       Barriers to Learning assessed: none  Patient verbalizes understanding of teaching and instructions.     Assessment: A new surgical mask/face shield was provided to the patient upon arrival. Contact or droplet precautions were followed when caring for the patient. PPE used by provider during encounter: surgical mask/face shield.    During midpoint vital sign check patient was feeling more breathless than last week and SpO2 92-93%. Found patient on a workload of 4 (previously on load 1), patient unaware he was on that level. Decreased back to Level 1 and he reported he felt much better, SpO2 increased to 96%. Patient encouraged to focus on pacing and PLB, demonstrated well.     At the end of class patient ambulated to the restroom, when he returned he appeared very SOB but using PLB. SpO2 assessed on finger probe 85-86% and HR 110. After a minute of resting SpO2 increased to 91-92% and HR 98. To confirm accuracy of reading, patient placed on forehead probe. After few minutes of resting longer SpO2 94% on finger probe and forehead probe 98% (about a four point difference). Informed patient next session it will be best to leave patient on continuous pulse ox to confirm accurate SpO2, patient with low DLCO in 2019 39%. Will continue to monitor closely.     Warm-up exercises were well-tolerated. VS within MD parameters. Kenneth Gordon tolerated all exercises well.    Check in  New symptoms: no  Dr.visits: no  Pain rating (0-10): 0/10  Pain  location: none  Medication changes: no  Weight: 91.4 kg  Temperature: - F    Pre-exercise vital signs  SpO2: 94% on RA  HR: 87  RPD: 1/10  RPE: 1/10  BP: 114/70  RR: 16  Peak flow: NA  BG: NA  Br/S: Clear and diminished   Pre-exercise bronchodilator used: Symbicort     Post-exercise vital signs  SpO2: 95% on RA  HR: 86  RPD: 0.5/10  RPE: 0.5/10  BP: 116/62  RR: 16  Peak flow: NA  BG: NA    EXERCISE SESSION    Continuous pulse oximetry during session: no  Intermittent pulse oximetry during session: yes  Exercise modifications: none      Cardio exercises - Training - continuous           Recumbent stepper setting: Load/level: 4 for 11 minutes (patient accidentally on Level 4 for first 11 minutes).  BP 126/73, SpO2 93% on RA, HR 99, RPD/RPE 3 / 5  METs achieved: - Distance: - miles    Recumbent stepper setting: Load/level: 1 for 9 minutes (remainder of exercise on decrease workload).  BP -/-, SpO2 96% on RA, HR 93, RPD/RPE 3 / 1  METs achieved: - Distance: - miles        Strength exercises            Warm up upper/lower body: 5 minutes  Strength exercises: 2 sets x 10-15 reps with 5# DB (  free weight) and yellow resistance band.  Rating of perceived dyspnea and exertion (RPD/RPE) on the following exercises:    Upper body exercises  Biceps: 2 / 2  Shoulders: 2 / 2  Chest: 1 / 1  Back: 1 / 1  Triceps: 1 / 1    Core exercises - 1 set x 15 reps  Reclined sit ups: 2 / 2    Lower body exercises - 1 set x 10 reps  Sit-to-stands: 1 / 1  Ankle exercises    Stretch exercises/cool down         Cool down    Plan: Patient continues to require current level of care. Continue to monitor SpO2 at rest and with exertion. Monitor proper pace and cue pursed-lip breathing techniques as needed. Review Home Exercise log with patient. Patient showing progress.    Maintain recumbent stepper workload to 1 and Maintain time for 20 minutes as tolerated by HR, SpO2, RPD and RPE rating.    Maintain bicep curls at 5# and Maintain sets and reps 2 x  12-15 as tolerated by HR, SpO2, RPD and RPE rating.    Maintain upright row, chest, back and triceps exercises to yellow band and Maintain 2 sets x 12-15 reps as tolerated by HR, SpO2, RPD and RPE rating.    Maintain static and/or dynamic balance exercises to 1 sets x 10-15 seconds/reps as tolerated by HR, SpO2, RPD and RPE rating.    [id number] ([id number]) Outpatient PR without continuous oximetry - 2 session(s).        Report Completed by Sharlot Gowda, BS, RRT  Pulmonary Rehabilitation Coordinator       Supervising physician:  Dr. Magnus Ivan

## 2022-11-18 NOTE — Pulmonary Rehab Treatment Plan (Signed)
INDIVIDUALIZED TREATMENT PLAN:   Pulmonary Rehabilitation/Outpatient Respiratory Therapy Services/Date: 10/31/2022  Kenneth Gordon is a 77yr old male with referring pulmonary diagnosis of COPD stage 4 Group E. Secondary pulmonary diagnosis included none. Kenneth Gordon was referred to the [facility]/Outpatient Respiratory Therapy service program by Dr. Karie Chimera.       Medical Necessity/Special Needs  Patients' pulmonary health history: Patient states he has more progressive SOB with moving about his home since 2021. Reports occasional cough, phlegm and chest tightness. His lung condition has greatly limited his activity level and is limited doing activities around the house. Mostly sedentray.  Pulmonary diagnostic tests completed: PFT,  CXR, A1AD = MM, IGE 75.8, RAST.  Cardiovascular tests completed: EKG, Echo, CT angioi, [place] LE, ABI     Smoking history:  Have you ever smoked or used tobacco? yes  Do you smoke currently? no  Year quit? 2021  Number packs per day? 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     Medical necessity: yes  Symptoms persistent despite medical therapy: yes  Increased AE/hospitalizations: no  Functional limits related to chronic lung disease: yes  HRQoL impairment: yes     Lab and test results reviewed with PR MD in PR Intake note DOS 07/31/2022 and reviewed on 10/28/2022.     PFT date: 10/06/2017  FVC 52%         FEV1 24%       FEV1/FVC 34%          TLC 79%         DLCO 39%      Adj DLCO 38%     Patient Goals: Patient states have less SOB with exertion, be more independent, walk half a mile      What activities would you like to be able to do if you could? Walk longer distances with less SOB  What are your family's goals for you? Stay independent     EXERCISE ASSESSMENT  Initial Exercise Assessment/Date: 10/31/2022  Current  exercise:  Patient currently exercises: none  Limitations/Barriers to exercise: SOB, muscle fatigue, muscle cramping  Exercise equipment at home: no  Currently activity level [id number]      Initial :   Distance (meters): 120             Speed: 0.75                 RPD/RPE: 3/3   METS: 1.5  Prior 6 MWD: 300                   Completed: 2019.  Functional Fitness tests:   One arm bicep curl: 13 reps with 8# DB    RPD/RPE: 2/2  Sit to stand: 10 reps    RPD/RPE: 4/4  Fall risk score: 8/22     Fall risk: Low  Uses cane, walker/rollator or wheelchair: Yes 4 wheelwalker     Physical deconditioning: Yes  No regular exercise/sedentary lifestyle: Yes  Knowledge deficit of exercise guidelines and safety: Yes  Decreased exercise tolerance: Yes  Exercise-induced hypoxemia: No     Pain Assessment:  Pain Problems                         Pain Score     Current treatment  ?Chest pain                            -                       -  ?  Incision pain                         -                       -  ?Leg pain                                4                      Stop and rest  ?Muscle/joint pain                  1-2                   Movement for stiff joints      Other                                   -                       -     Bone/Joint Problems               Pain Score      Current treatment  ? Arthritis                                -                       -  ? Knee Surgery                      -                       -   ? Back Surgery                       -                       -   ? Back Pain                            -                       -   ? Hip Replacement                 -                       -   ? Knee Replacement  -                       -   ? Leg Vascular Surgery         4                      surgery  ? Rotator Cuff                         -                       -  ?  Shoulder Pain                      -                       -  ? Other                                   -                        -     Exercise Plan  Exercise Goals:   Improve understanding of aerobic and resistance training and oxygen needs  Verbalize understanding of rate of perceived dyspnea (RPD) and rate of perceived exertion (RPE) as exercise guidelines for intensity.  Practice exercise prescription as advised by PR program coordinators under PR/ORS program medical director supervision.  Functional Capacity Goals:  Increase 6 MWD by > 30 meters  Increase physical activity > 20 min/day  Demonstrate proper dyspnea control with pursed lip breathing and pacing  Attend education on Benefits of Exercise     Exercise Interventions/Education  EXERCISE PRESCRIPTION  DO NOT EXERCISE PATIENT WITH PRE-EXERCISE VITALS OF:  BP > 140 or < 90/50  HR > 100 or < 60   SpO2 < 90   RR > 24 or < 8       Begin the following exercise modalities 2-3 times per week for 8 weeks as follows:   Energy manager for MET level of 1.0 to 1.5. Increase up to 10%/week. Keep SpO2 >92%, HR between 86 to 115 bpm, or 4-6/10 on Borg CR 10 scale.   Treadmill: Begin at 0.8 - 1.0 mph for 5 to 20 minutes   Recumbent Stepper: Begin at load of 1 for 5 to 20 minutes   Recumbent Bicycle: Begin at level 1 for 5 to 20 minutes  Arm Ergometer: Begin at level 1 for 5 to 20 minutes   Over ground walking: Begin for 5 to 20 minutes, self-paced     Note: Progress individualized exercise program based on participant responses to exercise, including HR, SpO2, BP, Borg rating and symptoms of exercise tolerance. Exercise intensity will be progressed individually and titrated based on 4-6/10 on Borg CR 10 scale. Progression of exercise training volume will result from increases in time, intensity, and frequency. Initial emphasis will be on increasing time.     Resistance Training Increase up to 10% when RPE is < 4, as tolerated to keep 4-6/10 on Borg CR 10 scale.  Resistance bands/Free weights/Weight machine  Begin with yellow resistance band and 5# weight. 1-3 sets of 10-15 reps.  Bicep  curls  Upright rows   Chest press  Back pull   Triceps extension  Reclined sit ups  Sit-to-Stands     Respiratory Muscle training   Frequency:  Twice daily, six days a week or as stated by your healthcare professional  Setting (I:E):  2/2  Time:  Strive to complete 3 sets of 10 repetitions  Type: Flow resistor     May order and administer Albuterol 0.083%, 2.5mg /53ml unit dose via hand-held nebulizer times 1 PRN for wheezing and shortness of breath. Notify referring physician if symptoms do not improve or additional bronchodilator treatments are needed.      May utilize supplemental oxygen 1-4 liters/min via nasal cannula during exercise to keep SpO2 greater than  92%. NOTE: if needed, may use high flow nasal cannula, simple mask, oxy mask, oxymizer oxygen conserver if more comfortable and if indicated.      May utilize [employer] Life2000 ventilation system. Mode: A/C, Vt = Rest/Low activity 150 mL, Medium activity 180 mL, High activity 200 mL, PEEP 0-5 cm H20, BR 0-20, I time 0.8-1.0 sec, Sensitivity 4. May adjust within +/- 75 mL of volume and/or O2 for each activity level as needed to maintain SpO2 >90% and adequate ventilation. Adjust to patient comfort for all Rxs.     May dispense and instruct patient on Oscillating PEP device as tolerated by HR, SpO2, RPD and RPE. PEP device opens weak or collapsed airways to mobilize and assist mucociliary clearance to the upper airways where it can be coughed out if indicated.      Review the exercise prescription based on initial evaluation findings, including results and functional fitness testing.  Monitor pain levels and adjust the exercise prescription as needed.  Adjust supplemental oxygen according to the Supplemental Oxygen Policy to maintain SpO2 > 92% during exercise.  Develop a personalized home-based exercise program.  Discuss home exercise guidelines and review the Physical Activity log.  Educate on proper use of respiratory medications before exercise.  Teach  paced breathing techniques for use during exercise and activities.  Discuss the impact of exercise on blood glucose levels and the optimal timing for exercise, if applicable.  Train patient on respiratory muscle training (RMT), if indicated.  Education on Benefits of Exercise - Review benefits and core components of exercise program, exercise guidelines and safety, RPD/RPE scales, breathing control strategies, warm up/cool down, equipment orientation, home exercise, O2 Use & safety, signs/symptoms to report. To be completed within 16 sessions.     30 Day Exercise Reassessment/Date:         Patient participated in # PR/ORS exercise sessions:  Patient able to complete # minutes of endurance exercise with fewer or no rest breaks:  Patient shows increased strength and endurance in both upper and lower extremities.  Patient maintains a daily home exercise log and develops a consistent home exercise routine of at least # additional days per week:  Modifications are required due to: (HR, SpO2, BP, RPD/RPE, symptoms of exercise intolerance)  Exercise tolerance not progressing since initial assessment due to exacerbation (not requiring hospitalization, requiring hospitalization, pain, other)     Interventions:  Taught patient breathing retraining techniques with a return demonstration.  Educated patient on how to monitor SpO2 using a pulse oximeter and manage oxygen use if needed.  Provided guidance on the proper use of respiratory medications before exercise if necessary.  Instructed on paced breathing techniques for exercise and daily activities.  Taught patient how to monitor pain using a pain scale and adjust the exercise prescription as needed.  Explained the effects of exercise on blood glucose levels and the optimal exercise schedule, if needed.  Provided patient with respiratory muscle training (RMT) with a return demonstration.     Exercise progression:  11/11/22 - Patient tolerated first day of group exercise. NS  load 1 for 15 min and UBE with yellow band 1 x 10 reps.     Education  How the Lungs Work: ATS handouts along with review during Breathing Retraining/Airway Clearance education   Benefits of Exercise:     Progress toward Goals/Plan: Showing progress, Goals met, Goals not met  Exercise progression Rx:     60 Day Exercise Reassessment/Date:      Patient  participated in # PR/ORS exercise sessions:  Patient able to complete # minutes of endurance exercise with fewer or no rest breaks:  Patient shows increased strength and endurance in both upper and lower extremities.  Patient maintains a daily home exercise log and develops a consistent home exercise routine of at least # additional days per week:  Modifications are required due to: (HR, SpO2, BP, RPD/RPE, symptoms of exercise intolerance)  Exercise tolerance not progressing since initial assessment due to exacerbation (not requiring hospitalization, requiring hospitalization, pain, other)     Interventions:  Taught patient breathing retraining techniques with a return demonstration.  Educated patient on how to monitor SpO2 using a pulse oximeter and manage oxygen use if needed.  Provided guidance on the proper use of respiratory medications before exercise if necessary.  Instructed on paced breathing techniques for exercise and daily activities.  Taught patient how to monitor pain using a pain scale and adjust the exercise prescription as needed.  Explained the effects of exercise on blood glucose levels and the optimal exercise schedule, if needed.  Provided patient with respiratory muscle training (RMT) with a return demonstration.     Exercise progression:        Education  How the Lungs Work: ATS handouts along with review during Breathing Retraining/Airway Clearance education   Benefits of Exercise:     Progress toward Goals/Plan: Showing progress, Goals met, Goals not met  Exercise progression Rx:     Exercise Discharge/Follow-up Date:   Patient participated in  # PR/ORS exercise sessions.  Patient able to complete # minutes of endurance exercise with fewer or no rest breaks.  Patient shows increased strength in both upper and lower extremities.     Patient verbalizes/demonstrates how to:  Pursed lip breathing, paced breathing and diaphragmatic breathing during exercise and daily activities.  Use the home exercise log with the breathlessness scale, strength, and balance exercise handouts.  Monitor SpO2 with a pulse oximeter and manage oxygen use if needed.  Use respiratory medications properly before exercise if needed.  Monitor pain using a pain scale and adjust the exercise prescription as needed.  Manage the effects of exercise on blood glucose levels and determine the optimal exercise schedule if applicable.  Use respiratory muscle training (RMT).     Patient attended education sessions on How the Lungs Work, Breathing Retraining Strategies, and the Benefits of Exercise.     Post (m):   Patient increased MET tolerance:  Reviewed home exercise plan with patient     Future plans to maintain gains achieved during PR/ORS:  Follow Home Exercise plan   Increase participation in physical activities  Health Management and Education Wellness classes  Join Virtual Maintenance exercise program  Exercise at nearby facility to patients home     Progress toward Goals: Goals met; Goals not met     NUTRITION ASSESSMENT  Initial Nutrition Assessment/Date: 10/31/2022  Body Composition  Admit Height: 72.6 inches  Admit Weight: 209 lbs.  Recent weight change: no   Patient would like to weight: 185   Difficulty with:  ? Shortness of breath during meals: - ? Shortness of breath after meals: -  Takes multivitamins: no  Takes other nutritional supplements: no, use to take protein shake when weight dropped to 145# years ago  Drinks alcohol: no     Patient review of nutrition habits/diet:   Has your illness or condition affected the kind and/or amount of food you eat? no   Do you eat fewer  than 2 meals per day? no   Do you eat few fruits, vegetables, or milk products? no   Do you have tooth or mouth problems that make it hard for you to eat?  dentures   Are there times when you do not have enough money to buy the food you need? no   Do you eat alone most of the time? yes   Do you take 3 or more different prescribed or over-the-counter drugs each day? yes   Without wanting to, have you lost or gained 10 or more pounds in the last 6 months? no   Are there times when you are not physically able to shop, cook, and/or feed yourself? no      Patient follows the following dietary Restrictions/Programs/Considerations? Reduce dairy products. Does not go downwell     Malnutrition Assessment (MNA) (Source: https://www.nestle.com)  Has food intake declined over the past 3 months due to loss of appetite, digestive problems, chewing or swallowing difficulties: 2  0 = severe decrease in food intake  1 = moderate decrease in food intake  2 = no decrease in food intake  Weight loss during the past 3 months: 3  0 = greater than 3 kg (6.6 lbs)   1 = does not know  2 = weight loss between 1-3 kg (2.2 to 6.6 lbs.)  3 = no weight loss  Mobility: 2  0 = bed or chair bound  1 = able to get out of bed/chair but does not go out   2 = goes out  Has suffered psychological stress or acute disease in the past 3 months: 2  0 = yes  2 = no  Neuropsychological problems: 2  0 = severe dementia or depression  1 = mild dementia   2 = no psychological problems  BMI: 26.8 kg/m2: 3  0 = BMI less than 19  1 = BMI 19 to less than 21  2 = BMI 21 to less than 23  3 = BMI 23 or greater  Total MNA score: 14  (12-14 Normal nutrition status, 8-11 At risk of malnutrition, 0-7 Malnourished)     Knowledge deficit in management of:  Diabetes: No                 A1C: -  CHF: No                        Base weight: -  Osteoporosis: No          Vitamin D/ Calcium supplement: No     Nutrition Plan  Nutrition Goals  Maintain weight  Show progress to  healthier BMI range 21-30 kg/m2  Enhance understanding of the role of exercise in weight control.  Increase knowledge of weight management while on prednisone, if applicable.  Improve self-management skills for diabetes, if relevant.  Improve self-management skills for congestive heart failure (CHF), if applicable.  Attend education on Nutrition education     Nutrition Interventions/Education  Monitor weight during PR/ORS program.  Review weight control strategies.  Provide education using a 24-hour or 3-day Food Diary for weight and nutrition monitoring.  Set goals for weight loss of 1-2 lbs/week, weight gain of 1-2 lbs/week, or weight maintenance as appropriate.  Refer to a registered dietitian (RD) as needed  Education on Nutrition - role of nutrition in managing chronic lung disease, follow diet plan, incorporating http://www.wall-moore.info/, reading nutrition labels, portion control, importance of adequate fluid intake, strategies  form managing breath during and after meals, strategies for making healthy dietary changes and strategies for increasing calories to encourage weight gain, role of supplements and prednisone. To be completed within 16 sessions.     30 Day Nutrition Reassessment/Date:   Current Weight (lbs.):  Maintaining weight  Weight gain:     Weight loss:     Interventions:  Monitored patients weight on weekly  basis  Provided patient with instructions on 24 hour or 3-day food diary.  Provided patient with information on the connection between dietary adherence, weight management and exercise adherence.  Provided patient with booklet on Diabetes/CHF/Osteoporosis management  Refer to Health Management & Education or a registered dietitian (RD) as needed  Eating habits (quantity/type):  Review 24 hour or 3-day food diary.:  Changes to Dietary Restrictions/Programs/Considerations?  Changes to appetite, skipping meals, frequency of dining out, cooking, or grocery shopping?     Education: Nutrition Basics:      Progress toward Goals/Plan: Showing progress, Goals met, Goals not met  Encourage patient to completed 24 hour or 3-day food diary. Encourage to increase whole foods, food preparation at home and optimal hydration.     60 Day Nutrition Reassessment/Date:   Current Weight (lbs.):  Maintaining weight  Weight gain (lbs.):     Weight loss (lbs.):     Interventions:  Monitored patients weight on weekly  basis  Provided patient with instructions on 24 hour or 3-day food diary.  Provided patient with information on the connection between dietary adherence, weight management and exercise adherence.  Provided patient with booklet on Diabetes/CHF/Osteoporosis management  Refer to Health Management & Education or a registered dietitian (RD) as needed  Eating habits (quantity/type):  Review dietary questionnaire or 3-day food log:  Changes to Dietary Restrictions/Programs/Considerations?  Changes to appetite, skipping meals, frequency of dining out, cooking, or grocery shopping?     Education: Nutrition Basics:     Progress toward Goals/Plan: Showing progress, Goals met, Goals not met  Encourage patient to completed 24 hour or 3-day food diary. Encourage to increase whole foods, food preparation at home and optimal hydration. Reinforce concepts taught in nutrition training.      Nutrition Discharge/Follow-up/Date:   Current Weight (lbs.):  Maintaining weight  Weight gain:     Weight loss:     Patient verbalizes/demonstrates understanding:  Connection between dietary adherence, weight management and exercise adherence.  Diabetes/CHF/Osteoporosis management.  Importance of optimal protein consumption to support bone health and muscle strength.  Importance of optimal fluid intake.     Future plans to maintain gains achieved during PR/ORS:  Health Management and Education Nutrition classes  Increase participation in physical activities  Health Management and Education Wellness classes  Join Virtual Maintenance exercise  program  Exercise at nearby facility to patients home     Progress toward Goals: Goals met; Goals not met     PSYCHOSOCIAL ASSESSMENT  Initial Psychosocial Assessment/Date: 10/31/2022  PHQ9:  1.  Indicates a potential for minimal depression.  GAD7: 1. Indicates a potential for  minimal anxiety.  Patient currently receiving mental health counseling: No  Patient currently using anti-depression / anti-anxiety medications: N/A  Patient reports special concerns/stressors experiencing at this time: no   Concerns/stressors that apply: -      Major source(s) of emotional support (Name/Relationship): Family       Living situation:  Patient lives alone  Patients living situation: independent;  Patient reports social isolation related to symptoms of shortness of breath and/or use of supplemental  oxygen: No  Patient transportation: son     Stage of change assessment: Preparation.     Psychosocial Plan  Psychosocial Goals  Improvement in PHQ-9 score: Decrease to/Maintain < 5 PHQ9 score  Improvement in GAD-7 score: Decrease to/Maintain < 5 GAD score  Adherent with appointments with mental health counselor, as needed    Adherent with appointments with PCP for medication assistance, as needed   Adherence with anti-anxiety medications/anti-depression medications  Acquire new skills to cope with shortness of breath (SOB) and anxiety.  Develop new stress management techniques.  Report a subjective improvement in accepting the disease process and its management.  Attend Emotional Health and Wellbeing education     Psychosocial Interventions/Education  Offer a variety of learning methods, including reading, note-taking, watching videos, discussing topics with others, hands-on experience, and interactive online resources.  Review screening tool results with the patient and notify the stress management counselor and physician if the PHQ-9 score is 10 or greater.  Engage the patient in continuous assessment of their psychosocial  status.  Provide information about support groups and health system/community resources.  Encourage group participation and sharing while respecting privacy.  Inform the patient that their spouse or support person may attend education sessions.  Train the patient in relaxation techniques and/or stress management strategies.  Discuss how regular exercise, good nutrition, and adequate sleep contribute to stress management.  Refer the patient to Mental Health or Social Services for one-on-one counseling if needed.  Refer the patient to their primary care provider (PCP) for prescription assistance if needed.  Provide information on Health Management and Education classes  Education on Emotional Health and Wellbeing - coping techniques, relaxation techniques, breathing strategies to control dyspnea. To be completed within 16 sessions     30 Day Psychosocial Reassessment/Date:   PHQ-9 score:  GAD score:  Patient verbalizes/demonstrates management of anxiety/depression.  Patient is satisfied with current support system.  Patient attends appointment with social services, mental health counselor, MD for medication assistance.  Patient adherent to Anti-depression medication, Adherent to anti-anxiety medication     Interventions:  Informed patient that their spouse or support person may attend education sessions.  Discussed how regular exercise, good nutrition, and adequate sleep contribute to stress management.  Provided information about support groups and health system/community resources.  Encouraged group participation and sharing while respecting privacy.     Education: Emotional Health and Well-being:     Progress toward Goals/Plan: Showing progress, Goals met, Goals not met  Continue to monitor PHQ9, GAD.   Reinforce tools to cope with SOB, anxiety and tools for stress management.     60 Day Psychosocial Reassessment/Date:   PHQ-9 score:  GAD score:  Patient verbalizes/demonstrates management of  anxiety/depression.  Patient is satisfied with current support system.  Patient attends appointment with social services, mental health counselor, MD for medication assistance.  Patient adherent to Anti-depression medication, Adherent to anti-anxiety medication     Interventions:  Informed patient that their spouse or support person may attend education sessions.  Discussed how regular exercise, good nutrition, and adequate sleep contribute to stress management.  Provided information about support groups and health system/community resources.  Encouraged group participation and sharing while respecting privacy.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 daily activities due to symptoms of shortness of breath and fatigue  Patient reports experiencing shortness of breath for: most ADLs  Patient reports shortness of breath with the following activities:   scale 0-4: 0 = None; 1 = Minimal; 2 = Moderate; 3 = Severe; 4 = Unable   1 Getting in and out of bed   2 Sweeping   2 Bending   1 Sitting at rest   2 Vacuuming   2 Gardening   3 Walking around the house   3 Straightening the house   3 Shopping   3 Bathing            2 Laundry   3 Hobbies   2 Toileting   3 Carrying objects  4 Sports             1 Washing face              2 Combing hair   2 Dressing   2 Cooking   0 Eating   3 Pushing/pulling    2 Washing dishes   4 Steps/Stairs, # of stairs?      Patient reports the  following to avoid or decrease shortness of breath: rest      Patient reports used the following items: MDI      Patient reports falling in the last 3 months? no  Please explain how: -     Patient reports able to perform the following household duties: most ADLs however is mild to moderately SOB and needs to stop and rest            Patient reports has a housekeeper? yes  Patient reports has a yard service?  no     Patient reports transportation: son     Patient has DMV handicap placard?  No, would like on     Patients living situation alone  Stairs:  # of stairs: elevator     ADL Plan   ADL Goals  Patient verbalizes/demonstrates daily activity management with control of dyspnea  Patient reports dyspnea control/pacing with stair climbing and ramps  Patient demonstrates appropriate use of walking aids for safety  Patient demonstrates safe participation in exercise and use of exercise equipment  Patient demonstrates balance exercises on balance handout  Patient attends education on Conservation of Energy     ADL Interventions/Education  Assess for dizziness and/or falls each session  Instruct on daily activity performance with pacing, dyspnea control with ADLs, exercise and stairs and incline  Instruct on balance exercises most sessions  Instruct on home safety and fall prevention  Instruct on assistive device evaluation, recommendations & resources   Education: Conservation of Energy - educate patient in pacing, pursed lip breathing and energy conservation techniques to improve ability to perform daily activities with less shortness of breath. To be completed within 16 sessions.      30 Day ADL Reassessment/Date:  [place] SOB:   CAT:  MMRC:   Patient shows an improvement in ability to perform exercise and daily activities     Interventions:  Patient provided education on effective breathing, pacing and ramp and stair climbing techniques  Patient proved education/demonstration of appropriate use of walking aids for  safety  Patient provided education on equipment safety and with balance exercises     Education: Conservation of Energy:     Progress toward Goals/Plan: Showing progress, Goals met, Goals not met  Continue to monitor patient progress with daily activity management with control of dyspnea.  Continue to monitor patient progress with safe participation in exercise class and use of exercise equipment.  Continue to monitor patient progress with balance exercises.     60 Day ADL Reassessment/Date:   [place] SOB:   CAT:  MMRC:   Patient shows an improvement in ability to perform exercise and daily activities     Interventions:  Patient provided education on effective breathing, pacing and ramp and stair climbing techniques  Patient proved education/demonstration of appropriate use of walking aids for safety  Patient provided education on equipment safety and with balance exercises     Education: Conservation of Energy:     Progress toward Goals/Plan: Showing progress, Goals met, Goals not met  Continue to monitor patient progress with daily activity  management with control of dyspnea.  Continue to monitor patient progress with safe participation in exercise class and use of exercise equipment.  Continue to monitor patient progress with balance exercises.     ADL Discharge/Follow/Date:   [place] SOB:   CAT:  MMRC:      Patient verbalizes/demonstrates:  Proper use of energy conservation techniques with daily activities and exercise   Safe participation in exercise class and use of exercise equipment and with balance exercises.     Future plans to maintain gains achieved during PR/ORS:  Patient to continue practice of the 4 Ps: Planning, Posture, Pacing and PLB with ADLs and exercise  Patient to continue safe and appropriate use of walking aids  Patient to continue to practice safety with balance exercises      Progress toward Goals: Goals met; Goals not met     Supportive Medicine ASSESSMENT  Initial Supportive Medicine  Assessment/Date: yes  Patient has completed advance directive and POLST forms: yes and no  Patient understands difference between hospice and palliative care: No  Patient has had a discussion with health care provider most comfortable with: No     Palliative Care Plan  Palliative Care Goals  Identify patient symptoms to help manage them  Ensure patient is as comfortable and active as possible  Assist and support patient and family with difficult medical decisions  Coordinate care and treatment among doctors during all stages of illness  Identify services that can support patient and family after completing                Palliative Care Plan Interventions/Education  Refer to [facility] for consultation, if needed or requested  Education: Advance Care Planning - Discuss difference in hospice and palliative care, purpose of having an advance directive and POLST form completed and importance of having a discussion with health care provider most comfortable with. To be completed within 16 sessions.     30 Day Supportive Medicine Reassessment/Date:   Patient has completed advance directive and POLST forms  Patient has had a discussion with health care provider most comfortable with     Interventions:  Patient provided care coordination with patients healthcare team.  Patient provided education on respiratory symptoms and strategies to help manage them  Patient provided information on difference in hospice and palliative care  Patient provided information on our Supportive Medicine clinic     Education: Advance Care Planning:     Progress toward Goals/Plan: Showing progress, Goals met, Goals not met  Continue to monitor patient progress with managing symptoms.  Continue to coordinate care and treatment among patients healthcare team.  Continue to assist and support patient and family with difficult medical decisions referring to Supportive Medicine clinic.      60 Day Supportive Medicine Reassessment/Date:    Patient has completed advance directive and POLST forms  Patient has had a discussion with health care provider most comfortable with     Interventions:  Patient provided care coordination with patients healthcare team.  Patient provided education on respiratory symptoms and strategies to help manage them  Patient provided information on difference in hospice and palliative care  Patient provided information on our Supportive Medicine clinic     Education: Advance Care Planning:     Progress toward Goals/Plan: Showing progress, Goals met; Goals not met  Continue to monitor patient progress with managing symptoms.  Continue to coordinate care and treatment among patients healthcare team.  Continue to assist and support patient  and family with difficult medical decisions referring to Supportive Medicine clinic.     Supportive Medicine Discharge/Final/Date:      Patient verbalizes/demonstrates:  Importance of having an advance directive and POLST forms  How to recognize worsening respiratory symptoms and strategies to help manage them  Understanding the difference in hospice and palliative care     Future plans to maintain gains achieved during PR/ORS:  Health Management and Education class on Advance Care Planning     Progress toward Goals: Goals met; Goals not met        Patient  Outcomes  Initial Assessment  Date: 10/31/2022 30 Day Reassessment        Date: 60 Day   Reassessment        Date: Discharge/Final           Date:                      Distance (meters)    120  Held until Discharge/Final Held until Discharge/Final     MPH 0.75         Max HR  119         Low SpO2   93% on RA          RPD 3         RPE 3         METS 1.5         Functional Fitness           Bicep Curls 13         Sit to Stand 10         Frail 8/22         Body Mass            Height (inches) 72.6         Weight (lbs.) 209          BMI (kg/m2) 26.8         Questionnaires            PHQ9  1         GAD7 1         [place] SOB 60         CAT  (COPD & QoL) 21         ACT  15         STOP BANG 5         EPWORTH 5         MMRC 4               Initial Assessment signed by  Felicity Pellegrini, MHAL, BA, RRT-NPS, RCP, CES  Date: 10/31/2022     Initial Assessment/Medical Director  This patient was seen, evaluated and care plan was developed with the Pulmonary Rehabilitation Coordinator. I agree with the assessment and plan as outlined in her note.  Electronically signed: 10-31-2022  Ranae Plumber. Dorethea Clan MD

## 2022-11-20 ENCOUNTER — Ambulatory Visit: Payer: Medicare HMO | Attending: Internal Medicine

## 2022-11-20 ENCOUNTER — Other Ambulatory Visit: Payer: Self-pay

## 2022-11-20 DIAGNOSIS — J449 Chronic obstructive pulmonary disease, unspecified: Secondary | ICD-10-CM | POA: Insufficient documentation

## 2022-11-20 NOTE — Pulmonary Rehab Treatment Plan (Addendum)
INDIVIDUALIZED TREATMENT PLAN:   Pulmonary Rehabilitation/Outpatient Respiratory Therapy Services/Date: 10/31/2022  Kenneth Gordon is a 77yr old male with referring pulmonary diagnosis of COPD stage 4 Group E. Secondary pulmonary diagnosis included none. [name] was referred to the [facility] [place] [facility]/Outpatient Respiratory Therapy service program by Dr. Karie Chimera.       Medical Necessity/Special Needs  Patients' pulmonary health history: Patient states he has more progressive SOB with moving about his home since 2021. Reports occasional cough, phlegm and chest tightness. His lung condition has greatly limited his activity level and is limited doing activities around the house. Mostly sedentray.  Pulmonary diagnostic tests completed: PFT,  CXR, A1AD = MM, IGE 75.8, RAST.  Cardiovascular tests completed: EKG, Echo, CT angioi, [place] LE, ABI     Smoking history:  Have you ever smoked or used tobacco? yes  Do you smoke currently? no  Year quit? 2021  Number packs per day? 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     Medical necessity: yes  Symptoms persistent despite medical therapy: yes  Increased AE/hospitalizations: no  Functional limits related to chronic lung disease: yes  HRQoL impairment: yes     Lab and test results reviewed with PR MD in PR Intake note DOS 07/31/2022 and reviewed on 10/28/2022.     PFT date: 10/06/2017  FVC 52%         FEV1 24%       FEV1/FVC 34%          TLC 79%         DLCO 39%      Adj DLCO 38%     Patient Goals: Patient states have less SOB with exertion, be more independent, walk half a mile      What activities would you like to be able to do if you could? Walk longer distances with less SOB  What are your family's goals for you? Stay independent     EXERCISE ASSESSMENT  Initial Exercise Assessment/Date: 10/31/2022  Current  exercise:  Patient currently exercises: none  Limitations/Barriers to exercise: SOB, muscle fatigue, muscle cramping  Exercise equipment at home: no  Currently activity level [id number]      Initial :   Distance (meters): 120             Speed: 0.75                 RPD/RPE: 3/3   METS: 1.5  Prior 6 MWD: 300                   Completed: 2019.  Functional Fitness tests:   One arm bicep curl: 13 reps with 8# DB    RPD/RPE: 2/2  Sit to stand: 10 reps    RPD/RPE: 4/4  Fall risk score: 8/22     Fall risk: Low  Uses cane, walker/rollator or wheelchair: Yes 4 wheelwalker     Physical deconditioning: Yes  No regular exercise/sedentary lifestyle: Yes  Knowledge deficit of exercise guidelines and safety: Yes  Decreased exercise tolerance: Yes  Exercise-induced hypoxemia: No     Pain Assessment:  Pain Problems                         Pain Score     Current treatment  ?Chest pain                            -                       -  ?  Incision pain                         -                       -  ?Leg pain                                4                      Stop and rest  ?Muscle/joint pain                  1-2                   Movement for stiff joints      Other                                   -                       -     Bone/Joint Problems               Pain Score      Current treatment  ? Arthritis                                -                       -  ? Knee Surgery                      -                       -   ? Back Surgery                       -                       -   ? Back Pain                            -                       -   ? Hip Replacement                 -                       -   ? Knee Replacement  -                       -   ? Leg Vascular Surgery         4                      surgery  ? Rotator Cuff                         -                       -  ?  Shoulder Pain                      -                       -  ? Other                                   -                        -     Exercise Plan  Exercise Goals:   Improve understanding of aerobic and resistance training and oxygen needs  Verbalize understanding of rate of perceived dyspnea (RPD) and rate of perceived exertion (RPE) as exercise guidelines for intensity.  Practice exercise prescription as advised by PR program coordinators under PR/ORS program medical director supervision.  Functional Capacity Goals:  Increase 6 MWD by > 30 meters  Increase physical activity > 20 min/day  Demonstrate proper dyspnea control with pursed lip breathing and pacing  Attend education on Benefits of Exercise     Exercise Interventions/Education  EXERCISE PRESCRIPTION  DO NOT EXERCISE PATIENT WITH PRE-EXERCISE VITALS OF:  BP > 140 or < 90/50  HR > 100 or < 60   SpO2 < 90   RR > 24 or < 8       Begin the following exercise modalities 2-3 times per week for 8 weeks as follows:   Energy manager for MET level of 1.0 to 1.5. Increase up to 10%/week. Keep SpO2 >92%, HR between 86 to 115 bpm, or 4-6/10 on Borg CR 10 scale.   Treadmill: Begin at 0.8 - 1.0 mph for 5 to 20 minutes   Recumbent Stepper: Begin at load of 1 for 5 to 20 minutes   Recumbent Bicycle: Begin at level 1 for 5 to 20 minutes  Arm Ergometer: Begin at level 1 for 5 to 20 minutes   Over ground walking: Begin for 5 to 20 minutes, self-paced     Note: Progress individualized exercise program based on participant responses to exercise, including HR, SpO2, BP, Borg rating and symptoms of exercise tolerance. Exercise intensity will be progressed individually and titrated based on 4-6/10 on Borg CR 10 scale. Progression of exercise training volume will result from increases in time, intensity, and frequency. Initial emphasis will be on increasing time.     Resistance Training Increase up to 10% when RPE is < 4, as tolerated to keep 4-6/10 on Borg CR 10 scale.  Resistance bands/Free weights/Weight machine  Begin with yellow resistance band and 5# weight. 1-3 sets of 10-15 reps.  Bicep  curls  Upright rows   Chest press  Back pull   Triceps extension  Reclined sit ups  Sit-to-Stands     Respiratory Muscle training   Frequency:  Twice daily, six days a week or as stated by your healthcare professional  Setting (I:E):  2/2  Time:  Strive to complete 3 sets of 10 repetitions  Type: Flow resistor     May order and administer Albuterol 0.083%, 2.5mg /58ml unit dose via hand-held nebulizer times 1 PRN for wheezing and shortness of breath. Notify referring physician if symptoms do not improve or additional bronchodilator treatments are needed.      May utilize supplemental oxygen 1-4 liters/min via nasal cannula during exercise to keep SpO2 greater than  92%. NOTE: if needed, may use high flow nasal cannula, simple mask, oxy mask, oxymizer oxygen conserver if more comfortable and if indicated.      May utilize [employer] Life2000 ventilation system. Mode: A/C, Vt = Rest/Low activity 150 mL, Medium activity 180 mL, High activity 200 mL, PEEP 0-5 cm H20, BR 0-20, I time 0.8-1.0 sec, Sensitivity 4. May adjust within +/- 75 mL of volume and/or O2 for each activity level as needed to maintain SpO2 >90% and adequate ventilation. Adjust to patient comfort for all Rxs.     May dispense and instruct patient on Oscillating PEP device as tolerated by HR, SpO2, RPD and RPE. PEP device opens weak or collapsed airways to mobilize and assist mucociliary clearance to the upper airways where it can be coughed out if indicated.      Review the exercise prescription based on initial evaluation findings, including results and functional fitness testing.  Monitor pain levels and adjust the exercise prescription as needed.  Adjust supplemental oxygen according to the Supplemental Oxygen Policy to maintain SpO2 > 92% during exercise.  Develop a personalized home-based exercise program.  Discuss home exercise guidelines and review the Physical Activity log.  Educate on proper use of respiratory medications before exercise.  Teach  paced breathing techniques for use during exercise and activities.  Discuss the impact of exercise on blood glucose levels and the optimal timing for exercise, if applicable.  Train patient on respiratory muscle training (RMT), if indicated.  Education on Benefits of Exercise - Review benefits and core components of exercise program, exercise guidelines and safety, RPD/RPE scales, breathing control strategies, warm up/cool down, equipment orientation, home exercise, O2 Use & safety, signs/symptoms to report. To be completed within 16 sessions.     30 Day Exercise Reassessment/Date: 11/20/22        Patient participated in # PR/ORS exercise sessions: 4  Patient able to complete # minutes of endurance exercise with fewer or no rest breaks: 20 min  Patient shows increased strength and endurance in both upper and lower extremities.  Patient maintains a daily home exercise log and develops a consistent home exercise routine of at least # additional days per week: 0  Modifications are required due to:  symptoms of exercise intolerance     Interventions:  Taught patient breathing retraining techniques with a return demonstration.  Educated patient on how to monitor SpO2 using a pulse oximeter and manage oxygen use if needed.  Provided guidance on the proper use of respiratory medications before exercise if necessary.  Instructed on paced breathing techniques for exercise and daily activities.  Taught patient how to monitor pain using a pain scale and adjust the exercise prescription as needed.     Exercise progression:  11/11/22 - Patient tolerated first day of group exercise. NS load 1 for 15 min and UBE with yellow band 1 x 10 reps.  11/20/22 - Patient progress to 20 min on NS  load 1. Patient having some challenges with PLB and pacing. Will modify cardio to interval training of 1-2 min work with 2-3 min rest.Patient progressed in UBE for 2 x 12 reps. Sit to stands very challenging for patient. Will reduce reps to 2-3 sets  of 3.      Education  How the Lungs Work: ATS handouts along with review during Breathing Retraining/Airway Clearance education   Benefits of Exercise: pending     Progress toward Goals/Plan: Showing progress  Exercise progression Rx:   Maintain recumbent stepper  workload to 1 with 1-2 min work/rest intervals and Maintain time for 20 minutes as tolerated by HR, SpO2, RPD and RPE rating.20  Maintain bicep curls at 5# and Maintain sets and reps 2 x 10-12 as tolerated by HR, SpO2, RPD and RPE rating.  Maintain upright row, chest, back and triceps exercises to yellow band and Maintain 2 sets x 10-12 reps as tolerated by HR, SpO2, RPD and RPE rating.  Maintain static and/or dynamic balance exercises to 1 sets x 10-15 seconds/reps as tolerated by HR, SpO2, RPD and RPE rating.     60 Day Exercise Reassessment/Date:   Patient participated in # PR/ORS exercise sessions:  Patient able to complete # minutes of endurance exercise with fewer or no rest breaks:  Patient shows increased strength and endurance in both upper and lower extremities.  Patient maintains a daily home exercise log and develops a consistent home exercise routine of at least # additional days per week:  Modifications are required due to: (HR, SpO2, BP, RPD/RPE, symptoms of exercise intolerance)  Exercise tolerance not progressing since initial assessment due to exacerbation (not requiring hospitalization, requiring hospitalization, pain, other)     Interventions:  Taught patient breathing retraining techniques with a return demonstration.  Educated patient on how to monitor SpO2 using a pulse oximeter and manage oxygen use if needed.  Provided guidance on the proper use of respiratory medications before exercise if necessary.  Instructed on paced breathing techniques for exercise and daily activities.  Taught patient how to monitor pain using a pain scale and adjust the exercise prescription as needed.  Explained the effects of exercise on blood glucose  levels and the optimal exercise schedule, if needed.  Provided patient with respiratory muscle training (RMT) with a return demonstration.     Exercise progression:        Education  How the Lungs Work: ATS handouts along with review during Breathing Retraining/Airway Clearance education   Benefits of Exercise:     Progress toward Goals/Plan: Showing progress, Goals met, Goals not met  Exercise progression Rx:     Exercise Discharge/Follow-up Date:   Patient participated in # PR/ORS exercise sessions.  Patient able to complete # minutes of endurance exercise with fewer or no rest breaks.  Patient shows increased strength in both upper and lower extremities.     Patient verbalizes/demonstrates how to:  Pursed lip breathing, paced breathing and diaphragmatic breathing during exercise and daily activities.  Use the home exercise log with the breathlessness scale, strength, and balance exercise handouts.  Monitor SpO2 with a pulse oximeter and manage oxygen use if needed.  Use respiratory medications properly before exercise if needed.  Monitor pain using a pain scale and adjust the exercise prescription as needed.  Manage the effects of exercise on blood glucose levels and determine the optimal exercise schedule if applicable.  Use respiratory muscle training (RMT).     Patient attended education sessions on How the Lungs Work, Breathing Retraining Strategies, and the Benefits of Exercise.     Post (m):   Patient increased MET tolerance:  Reviewed home exercise plan with patient     Future plans to maintain gains achieved during PR/ORS:  Follow Home Exercise plan   Increase participation in physical activities  Health Management and Education Wellness classes  Join Virtual Maintenance exercise program  Exercise at nearby facility to patients home     Progress toward Goals: Goals met; Goals not met     NUTRITION ASSESSMENT  Initial Nutrition Assessment/Date: 10/31/2022  Body Composition  Admit Height: 72.6  inches  Admit Weight: 209 lbs.  Recent weight change: no   Patient would like to weight: 185   Difficulty with:  ? Shortness of breath during meals: - ? Shortness of breath after meals: -  Takes multivitamins: no  Takes other nutritional supplements: no, use to take protein shake when weight dropped to 145# years ago  Drinks alcohol: no     Patient review of nutrition habits/diet:   Has your illness or condition affected the kind and/or amount of food you eat? no   Do you eat fewer than 2 meals per day? no   Do you eat few fruits, vegetables, or milk products? no   Do you have tooth or mouth problems that make it hard for you to eat?  dentures   Are there times when you do not have enough money to buy the food you need? no   Do you eat alone most of the time? yes   Do you take 3 or more different prescribed or over-the-counter drugs each day? yes   Without wanting to, have you lost or gained 10 or more pounds in the last 6 months? no   Are there times when you are not physically able to shop, cook, and/or feed yourself? no      Patient follows the following dietary Restrictions/Programs/Considerations? Reduce dairy products. Does not go downwell     Malnutrition Assessment (MNA) (Source: https://www.nestle.com)  Has food intake declined over the past 3 months due to loss of appetite, digestive problems, chewing or swallowing difficulties: 2  0 = severe decrease in food intake  1 = moderate decrease in food intake  2 = no decrease in food intake  Weight loss during the past 3 months: 3  0 = greater than 3 kg (6.6 lbs)   1 = does not know  2 = weight loss between 1-3 kg (2.2 to 6.6 lbs.)  3 = no weight loss  Mobility: 2  0 = bed or chair bound  1 = able to get out of bed/chair but does not go out   2 = goes out  Has suffered psychological stress or acute disease in the past 3 months: 2  0 = yes  2 = no  Neuropsychological problems: 2  0 = severe dementia or depression  1 = mild dementia   2 = no psychological  problems  BMI: 26.8 kg/m2: 3  0 = BMI less than 19  1 = BMI 19 to less than 21  2 = BMI 21 to less than 23  3 = BMI 23 or greater  Total MNA score: 14  (12-14 Normal nutrition status, 8-11 At risk of malnutrition, 0-7 Malnourished)     Knowledge deficit in management of:  Diabetes: No                 A1C: -  CHF: No                        Base weight: -  Osteoporosis: No          Vitamin D/ Calcium supplement: No     Nutrition Plan  Nutrition Goals  Maintain weight  Show progress to healthier BMI range 21-30 kg/m2  Enhance understanding of the role of exercise in weight control.  Increase knowledge of weight management while on prednisone, if applicable.  Improve self-management skills for diabetes,  if relevant.  Improve self-management skills for congestive heart failure (CHF), if applicable.  Attend education on Nutrition education     Nutrition Interventions/Education  Monitor weight during PR/ORS program.  Review weight control strategies.  Provide education using a 24-hour or 3-day Food Diary for weight and nutrition monitoring.  Set goals for weight loss of 1-2 lbs/week, weight gain of 1-2 lbs/week, or weight maintenance as appropriate.  Refer to a registered dietitian (RD) as needed  Education on Nutrition - role of nutrition in managing chronic lung disease, follow diet plan, incorporating http://www.wall-moore.info/, reading nutrition labels, portion control, importance of adequate fluid intake, strategies form managing breath during and after meals, strategies for making healthy dietary changes and strategies for increasing calories to encourage weight gain, role of supplements and prednisone. To be completed within 16 sessions.     30 Day Nutrition Reassessment/Date: 11/20/22    Current Weight (lbs.): 202.6  Weight loss: 7 #     Interventions:  Monitored patients weight on weekly  basis  Provided patient with instructions on 24 hour food diary.  Provided patient with information on the connection between dietary  adherence, weight management and exercise adherence.  Patient reports no change in eating habits (quantity/type)  11/20/22 - Patient has GERD managed with Pepcid. Patient also has episodes of bloating/constipation. Will see PCP next week for further follow up.    Education: Nutrition Basics: Pending     Progress toward Goals/Plan: Showing progress  Encourage patient to completed 24 hour food diary. Encourage to increase whole foods, food preparation at home and optimal hydration.     60 Day Nutrition Reassessment/Date:   Current Weight (lbs.):  Maintaining weight  Weight gain (lbs.):     Weight loss (lbs.):     Interventions:  Monitored patients weight on weekly  basis  Provided patient with instructions on 24 hour or 3-day food diary.  Provided patient with information on the connection between dietary adherence, weight management and exercise adherence.  Provided patient with booklet on Diabetes/CHF/Osteoporosis management  Refer to Health Management & Education or a registered dietitian (RD) as needed  Eating habits (quantity/type):  Review dietary questionnaire or 3-day food log:  Changes to Dietary Restrictions/Programs/Considerations?  Changes to appetite, skipping meals, frequency of dining out, cooking, or grocery shopping?     Education: Nutrition Basics:     Progress toward Goals/Plan: Showing progress, Goals met, Goals not met  Encourage patient to completed 24 hour or 3-day food diary. Encourage to increase whole foods, food preparation at home and optimal hydration. Reinforce concepts taught in nutrition training.      Nutrition Discharge/Follow-up/Date:   Current Weight (lbs.):  Maintaining weight  Weight gain:     Weight loss:     Patient verbalizes/demonstrates understanding:  Connection between dietary adherence, weight management and exercise adherence.  Diabetes/CHF/Osteoporosis management.  Importance of optimal protein consumption to support bone health and muscle strength.  Importance of optimal  fluid intake.     Future plans to maintain gains achieved during PR/ORS:  Health Management and Education Nutrition classes  Increase participation in physical activities  Health Management and Education Wellness classes  Join Virtual Maintenance exercise program  Exercise at nearby facility to patients home     Progress toward Goals: Goals met; Goals not met     PSYCHOSOCIAL ASSESSMENT  Initial Psychosocial Assessment/Date: 10/31/2022  PHQ9:  1.  Indicates a potential for minimal depression.  GAD7: 1. Indicates a potential for  minimal anxiety.  Patient currently  receiving mental health counseling: No  Patient currently using anti-depression / anti-anxiety medications: N/A  Patient reports special concerns/stressors experiencing at this time: no   Concerns/stressors that apply: -      Major source(s) of emotional support (Name/Relationship): Family       Living situation:  Patient lives alone  Patients living situation: independent;  Patient reports social isolation related to symptoms of shortness of breath and/or use of supplemental oxygen: No  Patient transportation: son     Stage of change assessment: Preparation.     Psychosocial Plan  Psychosocial Goals  Improvement in PHQ-9 score: Decrease to/Maintain < 5 PHQ9 score  Improvement in GAD-7 score: Decrease to/Maintain < 5 GAD score  Adherent with appointments with mental health counselor, as needed    Adherent with appointments with PCP for medication assistance, as needed   Adherence with anti-anxiety medications/anti-depression medications  Acquire new skills to cope with shortness of breath (SOB) and anxiety.  Develop new stress management techniques.  Report a subjective improvement in accepting the disease process and its management.  Attend Emotional Health and Wellbeing education     Psychosocial Interventions/Education  Offer a variety of learning methods, including reading, note-taking, watching videos, discussing topics with others, hands-on experience,  and interactive online resources.  Review screening tool results with the patient and notify the stress management counselor and physician if the PHQ-9 score is 10 or greater.  Engage the patient in continuous assessment of their psychosocial status.  Provide information about support groups and health system/community resources.  Encourage group participation and sharing while respecting privacy.  Inform the patient that their spouse or support person may attend education sessions.  Train the patient in relaxation techniques and/or stress management strategies.  Discuss how regular exercise, good nutrition, and adequate sleep contribute to stress management.  Refer the patient to Mental Health or Social Services for one-on-one counseling if needed.  Refer the patient to their primary care provider (PCP) for prescription assistance if needed.  Provide information on Health Management and Education classes  Education on Emotional Health and Wellbeing - coping techniques, relaxation techniques, breathing strategies to control dyspnea. To be completed within 16 sessions     30 Day Psychosocial Reassessment/Date: 11/20/22    PHQ-9 score: 1  GAD score: 0  Patient verbalizes/demonstrates management of anxiety/depression.  Patient is satisfied with current support system.     Interventions:  Informed patient that their spouse or support person may attend education sessions.  Discussed how regular exercise, good nutrition, and adequate sleep contribute to stress management.  Encouraged group participation and sharing while respecting privacy.  11/20/22 - Patient states he is enjoying the class and learning a lot.     Education: Emotional Health and Well-being: pending     Progress toward Goals/Plan: Showing progress  Continue to monitor PHQ9, GAD.   Reinforce tools to cope with SOB, anxiety and tools for stress management.     60 Day Psychosocial Reassessment/Date:   PHQ-9 score:  GAD score:  Patient verbalizes/demonstrates  management of anxiety/depression.  Patient is satisfied with current support system.  Patient attends appointment with social services, mental health counselor, MD for medication assistance.  Patient adherent to Anti-depression medication, Adherent to anti-anxiety medication     Interventions:  Informed patient that their spouse or support person may attend education sessions.  Discussed how regular exercise, good nutrition, and adequate sleep contribute to stress management.  Provided information about support groups and health system/community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 to avoid or decrease shortness of breath: rest      Patient reports used the following items: MDI      Patient reports falling in the last 3 months? no  Please explain how: -  Patient reports able to perform the following household duties: most ADLs however is mild to moderately SOB and needs to stop and rest            Patient reports has a housekeeper? yes  Patient reports has a yard service?  no     Patient reports transportation: son     Patient has DMV handicap placard?  No, would like on     Patients living situation alone  Stairs:  # of stairs: elevator     ADL Plan   ADL Goals  Patient verbalizes/demonstrates daily activity management with control of dyspnea  Patient reports dyspnea control/pacing with stair climbing and ramps  Patient demonstrates appropriate use of walking  aids for safety  Patient demonstrates safe participation in exercise and use of exercise equipment  Patient demonstrates balance exercises on balance handout  Patient attends education on Conservation of Energy     ADL Interventions/Education  Assess for dizziness and/or falls each session  Instruct on daily activity performance with pacing, dyspnea control with ADLs, exercise and stairs and incline  Instruct on balance exercises most sessions  Instruct on home safety and fall prevention  Instruct on assistive device evaluation, recommendations & resources   Education: Conservation of Energy - educate patient in pacing, pursed lip breathing and energy conservation techniques to improve ability to perform daily activities with less shortness of breath. To be completed within 16 sessions.      30 Day ADL Reassessment/Date: 11/20/22    [place] SOB: 39  CAT: 12  MMRC: 1  Patient shows an improvement in ability to perform exercise and daily activities     Interventions:  Patient provided education on effective breathing, pacing while on NS and with UBE  Patient proved education/demonstration of appropriate use of walking aids for safety - patient uses 4 wheel walker  Patient provided education on equipment safety and with balance exercises     Education: Conservation of Energy: completed 11/15/22     Progress toward Goals/Plan: Showing progress  Continue to monitor patient progress with daily activity management with control of dyspnea.  Continue to monitor patient progress with safe participation in exercise class and use of exercise equipment.  Continue to monitor patient progress with balance exercises.     60 Day ADL Reassessment/Date:   [place] SOB:   CAT:  MMRC:   Patient shows an improvement in ability to perform exercise and daily activities     Interventions:  Patient provided education on effective breathing, pacing and ramp and stair climbing techniques  Patient proved education/demonstration of appropriate use of  walking aids for safety  Patient provided education on equipment safety and with balance exercises     Education: Conservation of Energy:     Progress toward Goals/Plan: Showing progress, Goals met, Goals not met  Continue to monitor patient progress with daily activity management with control of dyspnea.  Continue to monitor patient progress with safe participation in exercise class and use of exercise equipment.  Continue to monitor patient progress with balance exercises.     ADL Discharge/Follow/Date:   [place] SOB:   CAT:  MMRC:      Patient verbalizes/demonstrates:  Proper use of energy conservation techniques with daily activities and exercise   Safe participation in exercise class and use of exercise equipment and with balance exercises.     Future plans to maintain gains achieved during PR/ORS:  Patient to continue practice of the 4 Ps: Planning, Posture, Pacing and PLB  with ADLs and exercise  Patient to continue safe and appropriate use of walking aids  Patient to continue to practice safety with balance exercises      Progress toward Goals: Goals met; Goals not met     Supportive Medicine ASSESSMENT  Initial Supportive Medicine Assessment/Date: yes  Patient has completed advance directive and POLST forms: yes and no  Patient understands difference between hospice and palliative care: No  Patient has had a discussion with health care provider most comfortable with: No     Palliative Care Plan  Palliative Care Goals  Identify patient symptoms to help manage them  Ensure patient is as comfortable and active as possible  Assist and support patient and family with difficult medical decisions  Coordinate care and treatment among doctors during all stages of illness  Identify services that can support patient and family after completing                Palliative Care Plan Interventions/Education  Refer to Supportive Medicine Clinic for consultation, if needed or requested  Education: Advance Care Planning - Discuss  difference in hospice and palliative care, purpose of having an advance directive and POLST form completed and importance of having a discussion with health care provider most comfortable with. To be completed within 16 sessions.     30 Day Supportive Medicine Reassessment/Date: 11/20/22    Patient has completed advance directive and POLST forms - not on file  Patient has had a discussion with health care provider most comfortable with - not at this time     Interventions:  Patient provided care coordination with patients healthcare team.  Patient provided education on respiratory symptoms and strategies to help manage them  Patient provided information on difference in hospice and palliative care  Patient provided information on our Supportive Medicine clinic     Education: Advance Care Planning: pending     Progress toward Goals/Plan: Showing progress, Goals met, Goals not met  Continue to monitor patient progress with managing symptoms.  Continue to coordinate care and treatment among patients healthcare team.  Continue to assist and support patient and family with difficult medical decisions referring to Supportive Medicine clinic.      60 Day Supportive Medicine Reassessment/Date:   Patient has completed advance directive and POLST forms  Patient has had a discussion with health care provider most comfortable with     Interventions:  Patient provided care coordination with patients healthcare team.  Patient provided education on respiratory symptoms and strategies to help manage them  Patient provided information on difference in hospice and palliative care  Patient provided information on our Supportive Medicine clinic     Education: Advance Care Planning:     Progress toward Goals/Plan: Showing progress, Goals met; Goals not met  Continue to monitor patient progress with managing symptoms.  Continue to coordinate care and treatment among patients healthcare team.  Continue to assist and support patient and  family with difficult medical decisions referring to Supportive Medicine clinic.     Supportive Medicine Discharge/Final/Date:      Patient verbalizes/demonstrates:  Importance of having an advance directive and POLST forms  How to recognize worsening respiratory symptoms and strategies to help manage them  Understanding the difference in hospice and palliative care     Future plans to maintain gains achieved during PR/ORS:  Health Management and Education class on Advance Care Planning     Progress toward Goals: Goals met; Goals not met  Patient  Outcomes  Initial Assessment  Date: 10/31/2022 30 Day Reassessment        Date: 11/20/22   60 Day   Reassessment        Date: Discharge/Final           Date:                      Distance (meters)    120  Held until Discharge/Final Held until Discharge/Final     MPH 0.75         Max HR  119         Low SpO2   93% on RA          RPD 3         RPE 3         METS 1.5         Functional Fitness           Bicep Curls 13         Sit to Stand 10         Frail 8/22         Body Mass            Height (inches) 72.6         Weight (lbs.) 209   202       BMI (kg/m2) 26.8  26       Questionnaires            PHQ9  1  1       GAD7 1  0       [place] SOB 60  39       CAT (COPD & QoL) 21  12       ACT  15  22       STOP BANG 5         EPWORTH 5         MMRC 4 1              Initial Assessment signed by  Felicity Pellegrini, MHAL, BA, RRT-NPS, RCP, CES  Date: 10/31/2022     Initial Assessment/Medical Director  This patient was seen, evaluated and care plan was developed with the Pulmonary Rehabilitation Coordinator. I agree with the assessment and plan as outlined in her note.  Electronically signed: 10-31-2022  Ranae Plumber. Hardin MD    30 Day Reassessment/ITP prepared and signed by:  Patient continues to need current level of care and guidance on exercise progression, breathing retraining, medications, nutrition and action plan.  Cherolyn Behrle, MHAL, BA, RRT-NPS, RCP, CES  Date: 11/21/22    30  Day Reassessment/Medical Director  This patient was seen, evaluated and care plan was developed with the Pulmonary Rehab coordinator. I agree with the assessment and plan as outlined in her note.   Report electronically signed by Waldon Reining, MD   Signature/Date: 11-20-2022

## 2022-11-21 NOTE — Progress Notes (Signed)
Pulmonary Rehabilitation/Outpatient Respiratory Services Session Progress Note     Note Started: 11/21/2022, 11:34 AM  Date of Service: 11/20/2022  Location of Service: [address], [address]    Diagnosis: COPD stage 4 group E    Session 4 of 17    Check in: [address]  Cardio exercises: 1040-1100 (20 minutes)  Strength exercises: 1120-1200 (40 minutes)  Balance/Stretch/Cool Down: 1200-1205 (5 minutes)  1 on 1 Education Time: -   Check out: 1205-1210  Total time: 100 minutes     1 on 1 Education   Topic: -    Barriers to Learning assessed: none  Patient verbalizes understanding of teaching and instructions.     Assessment: A new surgical mask/face shield was provided to the patient upon arrival. Contact or droplet precautions were followed when caring for the patient. PPE used by provider during encounter: surgical mask/face shield.    Patient reported some intermittent queasiness today. Reports this has been ongoing. Will see PCP next week.   At end of class (after sit-to-stands) patient took off continuous pulse ox and ambulated to restroom. Upon return patient was SOB,used PLB to regain control of his breathing.     Warm-up exercises were well-tolerated. VS within MD parameters. Kenneth Gordon tolerated all exercises well.    Check in  New symptoms: reports PND last few days  Dr.visits: no  Pain rating (0-10): no/10  Pain location: -  Medication changes: no  Weight: 92.1 kg  Temperature: 97.1 F    Pre-exercise vital signs  SpO2: 98% on RA  HR: 80  RPD: 1/10  RPE: 0.5/10  BP: 124/68  RR: 20  Peak flow: NA  BG: NA  Br/S: Diminished and clear  Pre-exercise bronchodilator used: Symbicort    Post-exercise vital signs  SpO2: 95% on RA  HR: 90  RPD: 1/10  RPE: 1/10  BP: 110/76  RR: 24  Peak flow: NA  BG: NA    EXERCISE SESSION    Continuous pulse oximetry during session: yes using forehead probe  Intermittent pulse oximetry during session: no  Exercise modifications: no      Cardio exercises - Training - continuous          Recumbent stepper setting: Load/level: 1 for 20 minutes.  BP 142/72, SpO2 98% on RA, HR 86, RPD/RPE 2 / 2  METs achieved: - Distance: 0.58 miles      Strength exercises            Warm up upper/lower body: 5 minutes  Strength exercises: 2 sets x 12 reps with 5# DB (free weight) and yellow resistance band.  Rating of perceived dyspnea and exertion (RPD/RPE) on the following exercises:    Upper body exercises  Biceps: 2 / 2  Shoulders: 1 / 1  Chest: 2 / 2  Back: 2 / 2  Triceps: 2 / 2    Core exercises - 1 set x 10 reps  Reclined sit ups: 1 / 1    Lower body exercises - 1 set x 5 reps  Sit-to-stands: 2 / 3  Ankle exercises    Stretch exercises/cool down - 1 set x - reps        Balance -Dynamic and static: - / -  Stretches - calf, quad, hamstring, low back     Plan: Patient continues to require current level of care. Continue to monitor SpO2 at rest and with exertion. Monitor proper pace and cue pursed-lip breathing techniques as needed. Review Home Exercise  log with patient. Patient showing progress.    Maintain recumbent stepper workload to 1 with 1-2 min work/rest intervals and Maintain time for 20 minutes as tolerated by HR, SpO2, RPD and RPE rating.20    Maintain bicep curls at 5# and Maintain sets and reps 2 x 10-12 as tolerated by HR, SpO2, RPD and RPE rating.    Maintain upright row, chest, back and triceps exercises to yellow band and Maintain 2 sets x 10-12 reps as tolerated by HR, SpO2, RPD and RPE rating.    Maintain static and/or dynamic balance exercises to 1 sets x 10-15 seconds/reps as tolerated by HR, SpO2, RPD and RPE rating.    [address] ([id number]) Outpatient PR without continuous oximetry - 2 session(s).    Report Completed by Felicity Pellegrini, MHAL, BA, RRT-NPS, RCP, CES      Supervising physician:  Dr. Melynda Keller

## 2022-11-22 ENCOUNTER — Ambulatory Visit: Payer: Medicare HMO | Attending: Internal Medicine

## 2022-11-22 DIAGNOSIS — J449 Chronic obstructive pulmonary disease, unspecified: Secondary | ICD-10-CM

## 2022-11-22 NOTE — Progress Notes (Signed)
Zoom Video Visit Note   Pulmonary Rehabilitation/Outpatient Respiratory Services Session Progress Note     Video Visit for Pulmonary Rehabilitation  I performed this clinical encounter by utilizing a real time telehealth video connection between my location and the patient's location due to COVID-19 Shelter-in recommended by the government. The patient's location was confirmed during this visit. I obtained verbal consent from the patient to perform this clinical encounter utilizing video and prepared the patient by answering any questions they had about the telehealth interaction.    Note Started: 11/22/2022, 1:55 PM  Date of Service: 11/22/22      Name: Kenneth Gordon  Diagnosis:  COPD stage 4 group E   Video visit via Zoom conducted from 11:00 - 12:30 PM    Educator: Julieta Bellini, BS, RRT-ACCS, RCP, Aimee Kizziar, BS, RRT, RCP  Education: Respiratory Exacerbation, Triggers, and Action Plan  Today in class we discussed what a respiratory exacerbation is and what signs and symptoms indicate the presence of an infection or an exacerbation for a patient with chronic lung disease. We also discussed the effect on the lungs when exposed to allergens and particulates in the environment.  Exposure to second and third hand smoke, extreme temperature changes, poor air quality and allergens may cause a respiratory exacerbation lasting for days to weeks. Common respiratory triggers were discussed along with how to avoid exposure. Kenneth Gordon discussed the importance checking the AQI and high temperatures in the summer to avoid exposure or wear a mask to protect sensitive airways.  Patients were encouraged to make appointments earlier. Kenneth Gordon discussed the commonalities each lung disease shares and the consequences of frequent respiratory exacerbation. Aimee instructed on how to perform a three-day self-assessment to establish their baseline respiratory status and reviewed the script when calling in to their doctor.  This homework will be followed up on at their next visit. The goal is to empower, equip, and establish a game plan for each individual to be better prepared. Kenneth Gordon was engaged and asked good questions during the lecture.      Report Completed by    Vesta Mixer, RN      Supervising physician: Dr. Deatra Robinson

## 2022-11-27 ENCOUNTER — Telehealth: Payer: Self-pay

## 2022-11-27 ENCOUNTER — Ambulatory Visit: Payer: Medicare HMO

## 2022-11-27 NOTE — Telephone Encounter (Signed)
Pulmonary Rehabilitation Telephone Encounter     11/27/2022  10:27 AM    Patient called pulmonary rehab with the following results: Patient identification confirmed using 3 patient identifiers (i.e. Name, DOB, MRN, last 4 of SSN, and/or address). Patient called to cancel today's PR exercise session due to discomfort from constipation. He has a PCP appointment but not until 12/12/22. He states he is going to call PCP office and ask for an earlier appt time as this has been affecting his participation in PR. Will follow up with patient.     Felicity Pellegrini, MHAL, BA, RRT-NPS, RCP, CES  Pulmonary Rehabilitation Program Supervisor  [phone]

## 2022-11-29 ENCOUNTER — Ambulatory Visit: Payer: Medicare HMO | Attending: Internal Medicine

## 2022-11-29 DIAGNOSIS — J449 Chronic obstructive pulmonary disease, unspecified: Secondary | ICD-10-CM

## 2022-11-29 NOTE — Progress Notes (Signed)
Zoom Video Visit Note   Pulmonary Rehabilitation/Outpatient Respiratory Services Session Progress Note     Video Visit for Pulmonary Rehabilitation  I performed this clinical encounter by utilizing a real time telehealth video connection between my location and the patient's location due to COVID-19 Shelter-in recommended by the government. The patient's location was confirmed during this visit. I obtained verbal consent from the patient to perform this clinical encounter utilizing video and prepared the patient by answering any questions they had about the telehealth interaction.    Note Started: 11/29/2022, 2:33 PM  Date of Service: 11/29/2022    Name: Kenneth Gordon  Diagnosis:  COPD stage 4 group E  Video visit via Zoom conducted from 11:00 - 12:30 PM    Educators: Mallorie Mullendore, BS, RRT, RCP  Education: Medications for Chronic Lung Disease  Mallorie began with an outline of the lecture: To discuss the different classes and types of inhalers and oral therapy used in respiratory diseases, side effects of medication classes and other possible causes of cough. She discussed the difference between rescue (short-acting) and maintenance (long-acting) medications. Some tips and tricks to offer to help our patients deal with potential side effects and compliancy to regimen. Mallorie then instructed the correct use of many of the medication delivery systems, to include the MDI, DPI. She emphasized the importance of a spacer with an MDI rescue inhaler. Patients were given information on how to find patient assistance programs to help with cost of medications if this is a problem. [name] was attentive and asked many questions.    Report Completed by    Felicity Pellegrini, MHAL, BA, RRT-NPS, RCP, CES      Supervising physician: Dr. Deatra Robinson

## 2022-12-02 ENCOUNTER — Ambulatory Visit: Payer: Medicare HMO

## 2022-12-04 ENCOUNTER — Ambulatory Visit: Payer: Medicare HMO

## 2022-12-04 ENCOUNTER — Telehealth: Payer: Self-pay

## 2022-12-04 NOTE — Telephone Encounter (Signed)
Pulmonary Rehabilitation Telephone Encounter     12/04/2022  1:37 PM    I have attempted to contact this patient with the following results: Three patient identifiers confirmed. Spoke to patient about missed PR appointments. He states continuing stomach issues and Dr. Roxy Cedar office is working on scheduling him for an appointment next week. Patient states he will call next week to let us know how he is doing. Patient wants to continue in the program however needs to have his GI issues addresses as they can further compromise his breathing     Lenya Sterne, MHAL, BA, RRT-NPS, RCP, CES  Pulmonary Rehabilitation Program Supervisor  [phone]

## 2022-12-06 ENCOUNTER — Ambulatory Visit: Payer: Medicare HMO | Attending: Internal Medicine

## 2022-12-06 DIAGNOSIS — J449 Chronic obstructive pulmonary disease, unspecified: Secondary | ICD-10-CM

## 2022-12-06 NOTE — Progress Notes (Signed)
Zoom Video Visit Note   Pulmonary Rehabilitation/Outpatient Respiratory Services Session Progress Note     Video Visit for Pulmonary Rehabilitation  I performed this clinical encounter by utilizing a real time telehealth video connection between my location and the patient's location due to COVID-19 Shelter-in recommended by the government. The patient's location was confirmed during this visit. I obtained verbal consent from the patient to perform this clinical encounter utilizing video and prepared the patient by answering any questions they had about the telehealth interaction.    Note Started: 12/06/2022, 4:35 PM  Date of Service: 12/06/22    Name: Kenneth Gordon  Diagnosis:  COPD stage 4 group E  Video visit via Zoom conducted from 11:00 - 12:30 PM    Lecturer:  Verdene Rio, RN  Education: Stress Reduction and Coping Skills in Chronic Disease  Dondra Spry discussed the nature of stress, common causes of stress, body responses to various types of stress, signs and symptoms of chronic stress. She discussed understanding the dyspnea cycle, how to break the cycle, and healthy ways to cope with abnormal stress responses. Dondra Spry discussed ways to manage stress by teaching 'Remember the 4 A's - Avoid, Alter, Accept, and Adapt, as well as relaxation techniques such as progressive relaxation and visualization. SHANNE HAMS was very engaged.    Pulmonary Rehab Medical Director:  Waldon Reining, Judie Petit D.  Education: Sleep & Chronic Lung Disease  Today Dr. Dorethea Clan met face to face with [name] for their mid-point. She discussed sleep, its effect on lung disease and how the two affect one another.  She began with the sleep stages NREM:  N1-N3, which is an anabolic stage, and REM sleep, which is a catabolic stage of sleep. She discussed things that may disrupt sleep such as shortness of breath, anxiety, sputum, medications, CHF and GERD.  She taught on sleep hygiene and those are developing a bedtime ritual, weight  loss, limiting caffeine and eating meals too close to bedtime.  In addition to OSA, other sleep disorders discussed were PLMD, RLS, insomnia, RBD and parasomnias. She presented an overview to include anatomy and pathophysiology of the disease, diagnosis of the problem, diagnostic testing/Sleep Studies, various surgical treatments and the use of CPAP/BIPAP as a treatment for sleep apnea. MUDASSIR SUNGA was very engaged.    Barriers to Learning assessed: none  Patient verbalizes understanding of teaching and instruction through teach back.     Report Completed by    Felicity Pellegrini, MHAL, BA, RRT-NPS, RCP, CES      Supervising physician: Dr. Dorethea Clan

## 2022-12-09 ENCOUNTER — Ambulatory Visit: Payer: Medicare HMO | Attending: Family Medicine

## 2022-12-09 ENCOUNTER — Ambulatory Visit: Payer: Medicare HMO

## 2022-12-09 DIAGNOSIS — I10 Essential (primary) hypertension: Secondary | ICD-10-CM

## 2022-12-09 DIAGNOSIS — E782 Mixed hyperlipidemia: Secondary | ICD-10-CM | POA: Insufficient documentation

## 2022-12-09 LAB — COMPREHENSIVE METABOLIC PANEL
Alanine Transferase (ALT): 18 U/L (ref <=41)
Albumin: 4.2 g/dL (ref 4.0–4.9)
Alkaline Phosphatase (ALP): 77 U/L (ref 35–129)
Anion Gap: 14 mmol/L (ref 7–15)
Aspartate Transaminase (AST): 21 U/L (ref <=41)
Bilirubin Total: 0.5 mg/dL (ref <=1.2)
Calcium: 8.9 mg/dL (ref 8.6–10.0)
Carbon Dioxide Total: 25 mmol/L (ref 22–29)
Chloride: 101 mmol/L (ref 98–107)
Creatinine Serum: 1.06 mg/dL (ref 0.51–1.17)
E-GFR Creatinine (Male): 73 mL/min/{1.73_m2}
Glucose: 93 mg/dL (ref 74–109)
Potassium: 4.7 mmol/L (ref 3.4–5.1)
Protein: 6.9 g/dL (ref 6.6–8.7)
Sodium: 140 mmol/L (ref 136–145)
Urea Nitrogen, Blood (BUN): 13 mg/dL (ref 6–20)

## 2022-12-09 LAB — LIPID PANEL WITH DLDL REFLEX
Cholesterol: 137 mg/dL (ref <200)
HDL Cholesterol: 50 mg/dL (ref >40)
LDL Cholesterol Calculation: 74 mg/dL (ref <100)
Non-HDL Cholesterol: 87 mg/dL (ref <=150)
Total Cholesterol: HDL Ratio: 2.7 (ref <4.0)
Triglyceride Level: 63 mg/dL (ref <150)

## 2022-12-10 ENCOUNTER — Ambulatory Visit: Payer: Medicare HMO

## 2022-12-10 ENCOUNTER — Ambulatory Visit: Payer: Medicare HMO | Attending: PULMONARY DISEASE

## 2022-12-10 DIAGNOSIS — J449 Chronic obstructive pulmonary disease, unspecified: Secondary | ICD-10-CM

## 2022-12-10 NOTE — Progress Notes (Signed)
A PFT has been performed, awaiting pulmonologist review.  Marybelle Killings, BSBA, RRT

## 2022-12-10 NOTE — Progress Notes (Signed)
 has been performed, awaiting pulmonologist review.  Marybelle Killings, BSBA, RRT

## 2022-12-11 ENCOUNTER — Ambulatory Visit: Payer: Medicare HMO

## 2022-12-12 ENCOUNTER — Telehealth: Payer: Self-pay | Admitting: Family Medicine

## 2022-12-12 ENCOUNTER — Ambulatory Visit: Payer: Medicare HMO | Admitting: Family Medicine

## 2022-12-12 DIAGNOSIS — E782 Mixed hyperlipidemia: Secondary | ICD-10-CM

## 2022-12-12 DIAGNOSIS — Z87891 Personal history of nicotine dependence: Secondary | ICD-10-CM

## 2022-12-12 DIAGNOSIS — Z122 Encounter for screening for malignant neoplasm of respiratory organs: Secondary | ICD-10-CM

## 2022-12-12 DIAGNOSIS — R1084 Generalized abdominal pain: Secondary | ICD-10-CM

## 2022-12-12 DIAGNOSIS — R140 Abdominal distension (gaseous): Secondary | ICD-10-CM

## 2022-12-12 MED ORDER — ROSUVASTATIN 20 MG TABLET
20.0000 mg | ORAL_TABLET | Freq: Every day | ORAL | 3 refills | Status: DC
Start: 2022-12-12 — End: 2023-12-05

## 2022-12-12 NOTE — Telephone Encounter (Signed)
Yes, but patient is stable and benefiting on currently inhalers. We can discuss changing at his next visit (was not discussed today).

## 2022-12-12 NOTE — Progress Notes (Signed)
Video Visit Note     I performed this clinical encounter by utilizing a real time telehealth video connection between my location and the patient's location. The patient's location was confirmed during this visit. I obtained verbal consent from the patient to perform this clinical encounter utilizing video and prepared the patient by answering any questions they had about the telehealth interaction.    Chief Complaint: Abdominal pain, bloating, cholesterol    SUBJECTIVE:  Kenneth Gordon is a 70yr male here with persistent abdominal discomfort, bloating.  Present for some time, seems to get worse when he is trying to do his pulmonary rehab exercises, bending.  Pain present on both sides of the abdomen.  No relation to food.  Also feels very bloated.  Feels like his abdomen is swollen and is hard to get down.  No blood in the stool.  No unusual weight loss.  Was having some constipation which is better now.  He had a colonoscopy several years ago and is due again, but declines.    Stopped atorvastatin for high cholesterol which seem to make the constipation worse.  Since he has stopped the medication the constipation has resolved.  Open to a different medication for cholesterol.    Also due for lung cancer screening.  Smoked about a pack a day for 40 to 50 years.  Quit a few years back.    OBJECTIVE:  General Appearance: alert, no distress, pleasant affect, cooperative.  Eyes:  conjunctivae and corneas clear. PERRL, EOM's intact. sclerae normal.  Ears:  normal TMs and canal and external inspection of ears show no abnormality.  Nose:  No congestion, turbinates normal.  Mouth: no pharyngeal erythema, moist mucus membranes.  Neck:  Neck supple. No adenopathy, thyroid symmetric, normal size.  Heart:  normal rate and regular rhythm, no murmurs  Lungs: clear to auscultation.  Abdomen: BS normal.  Abdomen soft, non-tender.  Extremities:  no cyanosis or edema.  Skin:   Neuro: Gait normal. Reflexes normal and symmetric.  Sensation and strength grossly normal. Cranial nerves 2-12 grossly intact.     ASSESSMENT/PLAN:  Diagnoses and all orders for this visit:    Mixed hyperlipidemia -well-controlled on recent lab work, Lipitor has been stopped, will initiate Crestor.  -     Rosuvastatin (CRESTOR) 20 mg Tablet; Take 1 tablet by mouth every day. (cholesterol)    Generalized abdominal pain  -     CT ABDOMEN + PELVIS WITH CONTRAST; Future    Abdominal bloating  -     CT ABDOMEN + PELVIS WITH CONTRAST; Future    Unclear etiology, reviewed his recent ultrasound which showed no evidence of ascites or gallbladder disease.  Consider diverticulitis, colitis, less likely appendicitis, other pathology.  Plan further evaluation with CT abdomen and pelvis.  Also discussed further evaluation with colonoscopy, declined at this time.  Will reassess after CT.    Screening for lung cancer -we reviewed lung cancer screening guidelines.  40+ pack year history, quit a few years back.  Agreeable with CT chest.  -     CT SCREENING LUNG; Future    Former smoker  -     CT SCREENING LUNG; Future    Today's visit has complexity inherent to evaluation and management associated with medical care services that serve as the continuing focal point for all needed health care services.    Barriers to Learning: none  Patient/Family Understanding: verbalizes    Follow up with continued or worsening symptoms, or in the  next 3 to 6 months.    Electronically signed by  [name], MD  [facility]     This note was created using the support of [employer]. Please note any grammatical, sound-alike, or syntax errors as likely dictation errors.       To the patient: If you are reviewing this progress note and have questions about the meaning or medical terms being used, please schedule an appointment or bring it up at your next follow-up appointment. Medical notes are meant to be a communication tool between medical professionals and require medical  terms to be used for efficiency. Thank you.

## 2022-12-12 NOTE — Telephone Encounter (Signed)
Received fax from pharmacy.    Please confirm whether Breztri or Trelegy are alternate options to have antichol/inhaled steroid and bronchodilator in 1 inhaler.

## 2022-12-13 ENCOUNTER — Ambulatory Visit: Payer: Medicare HMO

## 2022-12-13 ENCOUNTER — Ambulatory Visit: Payer: Medicare HMO | Attending: Internal Medicine

## 2022-12-13 ENCOUNTER — Telehealth: Payer: Self-pay

## 2022-12-13 DIAGNOSIS — J449 Chronic obstructive pulmonary disease, unspecified: Secondary | ICD-10-CM

## 2022-12-13 NOTE — Telephone Encounter (Signed)
Pharmacist notified.

## 2022-12-13 NOTE — Telephone Encounter (Signed)
Pulmonary Rehabilitation Telephone Encounter     12/13/2022  2:06 PM    I have attempted to contact this patient with the following results: Three patient identifiers confirmed. I spoke with patient to follow up on his health status and interest in continuing in PR program. He assured me he definitely wants to continue in the program however wants to put program on hold due to persistent abdominal discomfort and bloating. He met with Dr. Maple Hudson yesterday. He has placed an order for CT of pelvis abdomen chest and colonoscopy pending findings of CT scan. He will contact us once he feels ready to resume. I let him know our next class starts in October however we can get him back in if he is ready to restart before then. He states he is doing some of the exercises from the [place] booklet at home. He was very appreciative of the call.    Felicity Pellegrini, MHAL, BA, RRT-NPS, RCP, CES  Pulmonary Rehabilitation Program Supervisor  [phone]

## 2022-12-13 NOTE — Progress Notes (Signed)
Zoom Video Visit Note   Pulmonary Rehabilitation/Outpatient Respiratory Services Session Progress Note     Video Visit for Pulmonary Rehabilitation  I performed this clinical encounter by utilizing a real time telehealth video connection between my location and the patient's location due to COVID-19 Shelter-in recommended by the government. The patient's location was confirmed during this visit. I obtained verbal consent from the patient to perform this clinical encounter utilizing video and prepared the patient by answering any questions they had about the telehealth interaction.    Note Started: 12/13/2022, 1:34 PM  Date of Service: 12/13/22    Name: Kenneth Gordon  Diagnosis:  COPD stage 4 group E  Video visit via Zoom conducted from 11:00 - 12:30 PM    Educator:   Julieta Bellini, BS, RRT-ACCS, RCP  Education: Nutrition for Patients with Lung Disease    Today, Kenneth Gordon lectured on the, "Role of Nutrition in Lung Disease". The lecture addressed why nutrition is important, why weight matters, what should I eat and how should I eat. The common nutrient deficiencies (antioxidants, fiber, omega 3s, calcium, vitamin D and hydration), and their physical manifestations were discussed. The importance of identifying foods that cause gas/ bloating and acid reflux and how they can compromise lung health. The importance of eating 5-6 well-balanced, small meals throughout the day to increase metabolism and decrease hunger versus the standard 3 meals/day or forgetting to eat. The importance was placed on a balanced, nutrient-dense diet using the guidelines from http://www.wall-moore.info/. What the common components of a nutrient-dense diet includes vegetable and fruits, low-fat protein sources and whole grains. We also stressed the importance of a healthy BMI and that moderation is a key consideration in any diet. Emotional eating was also brought up and tips to help with this. Kenneth Gordon was very engaged and asked good  questions.    Educator:    Mallorie Mullendore, BS, RRT, RCP  Education: Supplemental Oxygen & Travel Preparedness   Today's lecture was on the use of supplemental oxygen and travelling with a chronic lung condition. The vital importance of oxygen and how the human body is dependent up oxygen to survive was taught. The patient was educated on how the human body reacts when hypoxic and how to prevent hypoxemia by using supplemental oxygen. Kenneth Gordon went over the testing modules used to determine if one is qualified for oxygen. She emphasized the importance of owning a pulse oximeter to monitor SpO2 and HR at rest and with activity. The class was shown different types of oxygen delivery systems including stationary concentrators, portable oxygen tanks and portable oxygen concentrators. The various delivery modes were reviewed along with the advantages and disadvantages of each.    Kenneth Gordon was taught how to prepare for travel with a chronic lung condition. Advice was given on how to appropriately plan for the environment during travel, i.e. weather, altitude, air quality. Traveling by airplane takes preparation due to the importance of knowing how one's body will react at high altitude.  Patients who have had trouble at high altitude were encouraged to request a High-Altitude Simulation Test (HAST) order from their doctor. Kenneth Gordon was also advised to bring a list of all current medications including oxygen prescription and to keep all medications in their original containers.  Kenneth Gordon was very engaged during lecture and asked many questions.    Report Completed by    Felicity Pellegrini, MHAL, BA, RRT-NPS, RCP, CES      Supervising physician: Dr.  Stollenwerk

## 2022-12-13 NOTE — Pulmonary Rehab Treatment Plan (Signed)
INDIVIDUALIZED TREATMENT PLAN:   Pulmonary Rehabilitation/Outpatient Respiratory Therapy Services/Date: 10/31/2022  Kenneth Gordon is a 78yr old male with referring pulmonary diagnosis of COPD stage 4 Group E. Secondary pulmonary diagnosis included none. Kenneth Gordon was referred to the [facility]/Outpatient Respiratory Therapy service program by Dr. Karie Chimera.       Medical Necessity/Special Needs  Patients' pulmonary health history: Patient states he has more progressive SOB with moving about his home since 2021. Reports occasional cough, phlegm and chest tightness. His lung condition has greatly limited his activity level and is limited doing activities around the house. Mostly sedentray.  Pulmonary diagnostic tests completed: PFT,  CXR, A1AD = MM, IGE 75.8, RAST.  Cardiovascular tests completed: EKG, Echo, CT angioi, [place] LE, ABI     Smoking history:  Have you ever smoked or used tobacco? yes  Do you smoke currently? no  Year quit? 2021  Number packs per day? 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     Medical necessity: yes  Symptoms persistent despite medical therapy: yes  Increased AE/hospitalizations: no  Functional limits related to chronic lung disease: yes  HRQoL impairment: yes     Lab and test results reviewed with PR MD in PR Intake note DOS 07/31/2022 and reviewed on 10/28/2022.     PFT date: 10/06/2017  FVC 52%         FEV1 24%       FEV1/FVC 34%          TLC 79%         DLCO 39%      Adj DLCO 38%     Patient Goals: Patient states have less SOB with exertion, be more independent, walk half a mile      What activities would you like to be able to do if you could? Walk longer distances with less SOB  What are your family's goals for you? Stay independent     EXERCISE ASSESSMENT  Initial Exercise Assessment/Date: 10/31/2022  Current  exercise:  Patient currently exercises: none  Limitations/Barriers to exercise: SOB, muscle fatigue, muscle cramping  Exercise equipment at home: no  Currently activity level [id number]      Initial :   Distance (meters): 120             Speed: 0.75                 RPD/RPE: 3/3   METS: 1.5  Prior 6 MWD: 300                   Completed: 2019.  Functional Fitness tests:   One arm bicep curl: 13 reps with 8# DB    RPD/RPE: 2/2  Sit to stand: 10 reps    RPD/RPE: 4/4  Fall risk score: 8/22     Fall risk: Low  Uses cane, walker/rollator or wheelchair: Yes 4 wheelwalker     Physical deconditioning: Yes  No regular exercise/sedentary lifestyle: Yes  Knowledge deficit of exercise guidelines and safety: Yes  Decreased exercise tolerance: Yes  Exercise-induced hypoxemia: No     Pain Assessment:  Pain Problems                         Pain Score     Current treatment  ?Chest pain                            -                       -  ?  Incision pain                         -                       -  ?Leg pain                                4                      Stop and rest  ?Muscle/joint pain                  1-2                   Movement for stiff joints      Other                                   -                       -     Bone/Joint Problems               Pain Score      Current treatment  ? Arthritis                                -                       -  ? Knee Surgery                      -                       -   ? Back Surgery                       -                       -   ? Back Pain                            -                       -   ? Hip Replacement                 -                       -   ? Knee Replacement  -                       -   ? Leg Vascular Surgery         4                      surgery  ? Rotator Cuff                         -                       -  ?  Shoulder Pain                      -                       -  ? Other                                   -                        -     Exercise Plan  Exercise Goals:   Improve understanding of aerobic and resistance training and oxygen needs  Verbalize understanding of rate of perceived dyspnea (RPD) and rate of perceived exertion (RPE) as exercise guidelines for intensity.  Practice exercise prescription as advised by PR program coordinators under PR/ORS program medical director supervision.  Functional Capacity Goals:  Increase 6 MWD by > 30 meters  Increase physical activity > 20 min/day  Demonstrate proper dyspnea control with pursed lip breathing and pacing  Attend education on Benefits of Exercise     Exercise Interventions/Education  EXERCISE PRESCRIPTION  DO NOT EXERCISE PATIENT WITH PRE-EXERCISE VITALS OF:  BP > 140 or < 90/50  HR > 100 or < 60   SpO2 < 90   RR > 24 or < 8       Begin the following exercise modalities 2-3 times per week for 8 weeks as follows:   Energy manager for MET level of 1.0 to 1.5. Increase up to 10%/week. Keep SpO2 >92%, HR between 86 to 115 bpm, or 4-6/10 on Borg CR 10 scale.   Treadmill: Begin at 0.8 - 1.0 mph for 5 to 20 minutes   Recumbent Stepper: Begin at load of 1 for 5 to 20 minutes   Recumbent Bicycle: Begin at level 1 for 5 to 20 minutes  Arm Ergometer: Begin at level 1 for 5 to 20 minutes   Over ground walking: Begin for 5 to 20 minutes, self-paced     Note: Progress individualized exercise program based on participant responses to exercise, including HR, SpO2, BP, Borg rating and symptoms of exercise tolerance. Exercise intensity will be progressed individually and titrated based on 4-6/10 on Borg CR 10 scale. Progression of exercise training volume will result from increases in time, intensity, and frequency. Initial emphasis will be on increasing time.     Resistance Training Increase up to 10% when RPE is < 4, as tolerated to keep 4-6/10 on Borg CR 10 scale.  Resistance bands/Free weights/Weight machine  Begin with yellow resistance band and 5# weight. 1-3 sets of 10-15 reps.  Bicep  curls  Upright rows   Chest press  Back pull   Triceps extension  Reclined sit ups  Sit-to-Stands     Respiratory Muscle training   Frequency:  Twice daily, six days a week or as stated by your healthcare professional  Setting (I:E):  2/2  Time:  Strive to complete 3 sets of 10 repetitions  Type: Flow resistor     May order and administer Albuterol 0.083%, 2.5mg /2ml unit dose via hand-held nebulizer times 1 PRN for wheezing and shortness of breath. Notify referring physician if symptoms do not improve or additional bronchodilator treatments are needed.      May utilize supplemental oxygen 1-4 liters/min via nasal cannula during exercise to keep SpO2 greater than  92%. NOTE: if needed, may use high flow nasal cannula, simple mask, oxy mask, oxymizer oxygen conserver if more comfortable and if indicated.      May utilize [employer] Life2000 ventilation system. Mode: A/C, Vt = Rest/Low activity 150 mL, Medium activity 180 mL, High activity 200 mL, PEEP 0-5 cm H20, BR 0-20, I time 0.8-1.0 sec, Sensitivity 4. May adjust within +/- 75 mL of volume and/or O2 for each activity level as needed to maintain SpO2 >90% and adequate ventilation. Adjust to patient comfort for all Rxs.     May dispense and instruct patient on Oscillating PEP device as tolerated by HR, SpO2, RPD and RPE. PEP device opens weak or collapsed airways to mobilize and assist mucociliary clearance to the upper airways where it can be coughed out if indicated.      Review the exercise prescription based on initial evaluation findings, including results and functional fitness testing.  Monitor pain levels and adjust the exercise prescription as needed.  Adjust supplemental oxygen according to the Supplemental Oxygen Policy to maintain SpO2 > 92% during exercise.  Develop a personalized home-based exercise program.  Discuss home exercise guidelines and review the Physical Activity log.  Educate on proper use of respiratory medications before exercise.  Teach  paced breathing techniques for use during exercise and activities.  Discuss the impact of exercise on blood glucose levels and the optimal timing for exercise, if applicable.  Train patient on respiratory muscle training (RMT), if indicated.  Education on Benefits of Exercise - Review benefits and core components of exercise program, exercise guidelines and safety, RPD/RPE scales, breathing control strategies, warm up/cool down, equipment orientation, home exercise, O2 Use & safety, signs/symptoms to report. To be completed within 16 sessions.     30 Day Exercise Reassessment/Date: 11/20/22        Patient participated in # PR/ORS exercise sessions: 4  Patient able to complete # minutes of endurance exercise with fewer or no rest breaks: 20 min  Patient shows increased strength and endurance in both upper and lower extremities.  Patient maintains a daily home exercise log and develops a consistent home exercise routine of at least # additional days per week: 0  Modifications are required due to:  symptoms of exercise intolerance     Interventions:  Taught patient breathing retraining techniques with a return demonstration.  Educated patient on how to monitor SpO2 using a pulse oximeter and manage oxygen use if needed.  Provided guidance on the proper use of respiratory medications before exercise if necessary.  Instructed on paced breathing techniques for exercise and daily activities.  Taught patient how to monitor pain using a pain scale and adjust the exercise prescription as needed.     Exercise progression:  11/11/22 - Patient tolerated first day of group exercise. NS load 1 for 15 min and UBE with yellow band 1 x 10 reps.  11/20/22 - Patient progress to 20 min on NS  load 1. Patient having some challenges with PLB and pacing. Will modify cardio to interval training of 1-2 min work with 2-3 min rest.Patient progressed in UBE for 2 x 12 reps. Sit to stands very challenging for patient. Will reduce reps to 2-3 sets  of 3.      Education  How the Lungs Work: ATS handouts along with review during Breathing Retraining/Airway Clearance education   Benefits of Exercise: pending     Progress toward Goals/Plan: Showing progress  Exercise progression Rx:   Maintain recumbent stepper  workload to 1 with 1-2 min work/rest intervals and Maintain time for 20 minutes as tolerated by HR, SpO2, RPD and RPE rating.20  Maintain bicep curls at 5# and Maintain sets and reps 2 x 10-12 as tolerated by HR, SpO2, RPD and RPE rating.  Maintain upright row, chest, back and triceps exercises to yellow band and Maintain 2 sets x 10-12 reps as tolerated by HR, SpO2, RPD and RPE rating.  Maintain static and/or dynamic balance exercises to 1 sets x 10-15 seconds/reps as tolerated by HR, SpO2, RPD and RPE rating.     60 Day Exercise Reassessment/Date: 12/13/22 ***  Patient participated in # PR/ORS exercise sessions:  Patient able to complete # minutes of endurance exercise with fewer or no rest breaks:  Patient shows increased strength and endurance in both upper and lower extremities.  Patient maintains a daily home exercise log and develops a consistent home exercise routine of at least # additional days per week:  Modifications are required due to: (HR, SpO2, BP, RPD/RPE, symptoms of exercise intolerance)  Exercise tolerance not progressing since initial assessment due to exacerbation (not requiring hospitalization, requiring hospitalization, pain, other)     Interventions:  Taught patient breathing retraining techniques with a return demonstration.  Educated patient on how to monitor SpO2 using a pulse oximeter and manage oxygen use if needed.  Provided guidance on the proper use of respiratory medications before exercise if necessary.  Instructed on paced breathing techniques for exercise and daily activities.  Taught patient how to monitor pain using a pain scale and adjust the exercise prescription as needed.  Explained the effects of exercise on blood  glucose levels and the optimal exercise schedule, if needed.  Provided patient with respiratory muscle training (RMT) with a return demonstration.     Exercise progression:        Education  How the Lungs Work: ATS handouts along with review during Breathing Retraining/Airway Clearance education   Benefits of Exercise:     Progress toward Goals/Plan: Showing progress, Goals met, Goals not met  Exercise progression Rx:     Exercise Discharge/Follow-up Date:   Patient participated in # PR/ORS exercise sessions.  Patient able to complete # minutes of endurance exercise with fewer or no rest breaks.  Patient shows increased strength in both upper and lower extremities.     Patient verbalizes/demonstrates how to:  Pursed lip breathing, paced breathing and diaphragmatic breathing during exercise and daily activities.  Use the home exercise log with the breathlessness scale, strength, and balance exercise handouts.  Monitor SpO2 with a pulse oximeter and manage oxygen use if needed.  Use respiratory medications properly before exercise if needed.  Monitor pain using a pain scale and adjust the exercise prescription as needed.  Manage the effects of exercise on blood glucose levels and determine the optimal exercise schedule if applicable.  Use respiratory muscle training (RMT).     Patient attended education sessions on How the Lungs Work, Breathing Retraining Strategies, and the Benefits of Exercise.     Post (m):   Patient increased MET tolerance:  Reviewed home exercise plan with patient     Future plans to maintain gains achieved during PR/ORS:  Follow Home Exercise plan   Increase participation in physical activities  Health Management and Education Wellness classes  Join Virtual Maintenance exercise program  Exercise at nearby facility to patients home     Progress toward Goals: Goals met; Goals not met     NUTRITION ASSESSMENT  Initial Nutrition Assessment/Date: 10/31/2022  Body Composition  Admit Height: 72.6  inches  Admit Weight: 209 lbs.  Recent weight change: no   Patient would like to weight: 185   Difficulty with:  ? Shortness of breath during meals: - ? Shortness of breath after meals: -  Takes multivitamins: no  Takes other nutritional supplements: no, use to take protein shake when weight dropped to 145# years ago  Drinks alcohol: no     Patient review of nutrition habits/diet:   Has your illness or condition affected the kind and/or amount of food you eat? no   Do you eat fewer than 2 meals per day? no   Do you eat few fruits, vegetables, or milk products? no   Do you have tooth or mouth problems that make it hard for you to eat?  dentures   Are there times when you do not have enough money to buy the food you need? no   Do you eat alone most of the time? yes   Do you take 3 or more different prescribed or over-the-counter drugs each day? yes   Without wanting to, have you lost or gained 10 or more pounds in the last 6 months? no   Are there times when you are not physically able to shop, cook, and/or feed yourself? no      Patient follows the following dietary Restrictions/Programs/Considerations? Reduce dairy products. Does not go downwell     Malnutrition Assessment (MNA) (Source: https://www.nestle.com)  Has food intake declined over the past 3 months due to loss of appetite, digestive problems, chewing or swallowing difficulties: 2  0 = severe decrease in food intake  1 = moderate decrease in food intake  2 = no decrease in food intake  Weight loss during the past 3 months: 3  0 = greater than 3 kg (6.6 lbs)   1 = does not know  2 = weight loss between 1-3 kg (2.2 to 6.6 lbs.)  3 = no weight loss  Mobility: 2  0 = bed or chair bound  1 = able to get out of bed/chair but does not go out   2 = goes out  Has suffered psychological stress or acute disease in the past 3 months: 2  0 = yes  2 = no  Neuropsychological problems: 2  0 = severe dementia or depression  1 = mild dementia   2 = no psychological  problems  BMI: 26.8 kg/m2: 3  0 = BMI less than 19  1 = BMI 19 to less than 21  2 = BMI 21 to less than 23  3 = BMI 23 or greater  Total MNA score: 14  (12-14 Normal nutrition status, 8-11 At risk of malnutrition, 0-7 Malnourished)     Knowledge deficit in management of:  Diabetes: No                 A1C: -  CHF: No                        Base weight: -  Osteoporosis: No          Vitamin D/ Calcium supplement: No     Nutrition Plan  Nutrition Goals  Maintain weight  Show progress to healthier BMI range 21-30 kg/m2  Enhance understanding of the role of exercise in weight control.  Increase knowledge of weight management while on prednisone, if applicable.  Improve self-management skills for diabetes,  if relevant.  Improve self-management skills for congestive heart failure (CHF), if applicable.  Attend education on Nutrition education     Nutrition Interventions/Education  Monitor weight during PR/ORS program.  Review weight control strategies.  Provide education using a 24-hour or 3-day Food Diary for weight and nutrition monitoring.  Set goals for weight loss of 1-2 lbs/week, weight gain of 1-2 lbs/week, or weight maintenance as appropriate.  Refer to a registered dietitian (RD) as needed  Education on Nutrition - role of nutrition in managing chronic lung disease, follow diet plan, incorporating http://www.wall-moore.info/, reading nutrition labels, portion control, importance of adequate fluid intake, strategies form managing breath during and after meals, strategies for making healthy dietary changes and strategies for increasing calories to encourage weight gain, role of supplements and prednisone. To be completed within 16 sessions.     30 Day Nutrition Reassessment/Date: 11/20/22    Current Weight (lbs.): 202.6  Weight loss: 7 #     Interventions:  Monitored patients weight on weekly  basis  Provided patient with instructions on 24 hour food diary.  Provided patient with information on the connection between dietary  adherence, weight management and exercise adherence.  Patient reports no change in eating habits (quantity/type)  11/20/22 - Patient has GERD managed with Pepcid. Patient also has episodes of bloating/constipation. Will see PCP next week for further follow up.    Education: Nutrition Basics: Pending     Progress toward Goals/Plan: Showing progress  Encourage patient to completed 24 hour food diary. Encourage to increase whole foods, food preparation at home and optimal hydration.     60 Day Nutrition Reassessment/Date: 12/13/22 ***  Current Weight (lbs.):  Maintaining weight  Weight gain (lbs.):     Weight loss (lbs.):     Interventions:  Monitored patients weight on weekly  basis  Provided patient with instructions on 24 hour or 3-day food diary.  Provided patient with information on the connection between dietary adherence, weight management and exercise adherence.  Provided patient with booklet on Diabetes/CHF/Osteoporosis management  Refer to Health Management & Education or a registered dietitian (RD) as needed  Eating habits (quantity/type):  Review dietary questionnaire or 3-day food log:  Changes to Dietary Restrictions/Programs/Considerations?  Changes to appetite, skipping meals, frequency of dining out, cooking, or grocery shopping?     Education: Nutrition Basics:     Progress toward Goals/Plan: Showing progress, Goals met, Goals not met  Encourage patient to completed 24 hour or 3-day food diary. Encourage to increase whole foods, food preparation at home and optimal hydration. Reinforce concepts taught in nutrition training.      Nutrition Discharge/Follow-up/Date:   Current Weight (lbs.):  Maintaining weight  Weight gain:     Weight loss:     Patient verbalizes/demonstrates understanding:  Connection between dietary adherence, weight management and exercise adherence.  Diabetes/CHF/Osteoporosis management.  Importance of optimal protein consumption to support bone health and muscle strength.  Importance  of optimal fluid intake.     Future plans to maintain gains achieved during PR/ORS:  Health Management and Education Nutrition classes  Increase participation in physical activities  Health Management and Education Wellness classes  Join Virtual Maintenance exercise program  Exercise at nearby facility to patients home     Progress toward Goals: Goals met; Goals not met     PSYCHOSOCIAL ASSESSMENT  Initial Psychosocial Assessment/Date: 10/31/2022  PHQ9:  1.  Indicates a potential for minimal depression.  GAD7: 1. Indicates a potential for  minimal anxiety.  Patient  currently receiving mental health counseling: No  Patient currently using anti-depression / anti-anxiety medications: N/A  Patient reports special concerns/stressors experiencing at this time: no   Concerns/stressors that apply: -      Major source(s) of emotional support (Name/Relationship): Family       Living situation:  Patient lives alone  Patients living situation: independent;  Patient reports social isolation related to symptoms of shortness of breath and/or use of supplemental oxygen: No  Patient transportation: son     Stage of change assessment: Preparation.     Psychosocial Plan  Psychosocial Goals  Improvement in PHQ-9 score: Decrease to/Maintain < 5 PHQ9 score  Improvement in GAD-7 score: Decrease to/Maintain < 5 GAD score  Adherent with appointments with mental health counselor, as needed    Adherent with appointments with PCP for medication assistance, as needed   Adherence with anti-anxiety medications/anti-depression medications  Acquire new skills to cope with shortness of breath (SOB) and anxiety.  Develop new stress management techniques.  Report a subjective improvement in accepting the disease process and its management.  Attend Emotional Health and Wellbeing education     Psychosocial Interventions/Education  Offer a variety of learning methods, including reading, note-taking, watching videos, discussing topics with others, hands-on  experience, and interactive online resources.  Review screening tool results with the patient and notify the stress management counselor and physician if the PHQ-9 score is 10 or greater.  Engage the patient in continuous assessment of their psychosocial status.  Provide information about support groups and health system/community resources.  Encourage group participation and sharing while respecting privacy.  Inform the patient that their spouse or support person may attend education sessions.  Train the patient in relaxation techniques and/or stress management strategies.  Discuss how regular exercise, good nutrition, and adequate sleep contribute to stress management.  Refer the patient to Mental Health or Social Services for one-on-one counseling if needed.  Refer the patient to their primary care provider (PCP) for prescription assistance if needed.  Provide information on Health Management and Education classes  Education on Emotional Health and Wellbeing - coping techniques, relaxation techniques, breathing strategies to control dyspnea. To be completed within 16 sessions     30 Day Psychosocial Reassessment/Date: 11/20/22    PHQ-9 score: 1  GAD score: 0  Patient verbalizes/demonstrates management of anxiety/depression.  Patient is satisfied with current support system.     Interventions:  Informed patient that their spouse or support person may attend education sessions.  Discussed how regular exercise, good nutrition, and adequate sleep contribute to stress management.  Encouraged group participation and sharing while respecting privacy.  11/20/22 - Patient states he is enjoying the class and learning a lot.     Education: Emotional Health and Well-being: pending     Progress toward Goals/Plan: Showing progress  Continue to monitor PHQ9, GAD.   Reinforce tools to cope with SOB, anxiety and tools for stress management.     60 Day Psychosocial Reassessment/Date: 12/13/22 ***  PHQ-9 score:  GAD score:  Patient  verbalizes/demonstrates management of anxiety/depression.  Patient is satisfied with current support system.  Patient attends appointment with social services, mental health counselor, MD for medication assistance.  Patient adherent to Anti-depression medication, Adherent to anti-anxiety medication     Interventions:  Informed patient that their spouse or support person may attend education sessions.  Discussed how regular exercise, good nutrition, and adequate sleep contribute to stress management.  Provided information about support groups and health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    2 Washing dishes   4 Steps/Stairs, # of stairs?      Patient reports the following to avoid or decrease shortness of breath: rest      Patient reports used the following items: MDI      Patient reports falling in the  last 3 months? no  Please explain how: -     Patient reports able to perform the following household duties: most ADLs however is mild to moderately SOB and needs to stop and rest            Patient reports has a housekeeper? yes  Patient reports has a yard service?  no     Patient reports transportation: son     Patient has DMV handicap placard?  No, would like on     Patients living situation alone  Stairs:  # of stairs: elevator     ADL Plan   ADL Goals  Patient verbalizes/demonstrates daily activity management with control of dyspnea  Patient reports dyspnea control/pacing  with stair climbing and ramps  Patient demonstrates appropriate use of walking aids for safety  Patient demonstrates safe participation in exercise and use of exercise equipment  Patient demonstrates balance exercises on balance handout  Patient attends education on Conservation of Energy     ADL Interventions/Education  Assess for dizziness and/or falls each session  Instruct on daily activity performance with pacing, dyspnea control with ADLs, exercise and stairs and incline  Instruct on balance exercises most sessions  Instruct on home safety and fall prevention  Instruct on assistive device evaluation, recommendations & resources   Education: Conservation of Energy - educate patient in pacing, pursed lip breathing and energy conservation techniques to improve ability to perform daily activities with less shortness of breath. To be completed within 16 sessions.      30 Day ADL Reassessment/Date: 11/20/22    [place] SOB: 39  CAT: 12  MMRC: 1  Patient shows an improvement in ability to perform exercise and daily activities     Interventions:  Patient provided education on effective breathing, pacing while on NS and with UBE  Patient proved education/demonstration of appropriate use of walking aids for safety - patient uses 4 wheel walker  Patient provided education on equipment safety and with balance exercises     Education: Conservation of Energy: completed 11/15/22     Progress toward Goals/Plan: Showing progress  Continue to monitor patient progress with daily activity management with control of dyspnea.  Continue to monitor patient progress with safe participation in exercise class and use of exercise equipment.  Continue to monitor patient progress with balance exercises.     60 Day ADL Reassessment/Date: 12/13/22 ***  [place] SOB:   CAT:  MMRC:   Patient shows an improvement in ability to perform exercise and daily activities     Interventions:  Patient provided education on effective breathing, pacing and ramp and  stair climbing techniques  Patient proved education/demonstration of appropriate use of walking aids for safety  Patient provided education on equipment safety and with balance exercises     Education: Conservation of Energy:     Progress toward Goals/Plan: Showing progress, Goals met, Goals not met  Continue to monitor patient progress with daily activity management with control of dyspnea.  Continue to monitor patient progress with safe participation in exercise class and use of exercise equipment.  Continue to monitor patient progress with balance exercises.     ADL Discharge/Follow/Date:   [place] SOB:   CAT:  MMRC:      Patient verbalizes/demonstrates:  Proper use of energy conservation techniques with daily activities and exercise   Safe participation in exercise class and use of exercise equipment and with balance exercises.     Future plans to maintain gains achieved during PR/ORS:  Patient to continue practice of the 4 Ps: Planning, Posture, Pacing and PLB with ADLs and exercise  Patient to continue safe and appropriate use of walking aids  Patient to continue to practice safety with balance exercises      Progress toward Goals: Goals met; Goals not met     Supportive Medicine ASSESSMENT  Initial Supportive Medicine Assessment/Date: yes  Patient has completed advance directive and POLST forms: yes and no  Patient understands difference between hospice and palliative care: No  Patient has had a discussion with health care provider most comfortable with: No     Palliative Care Plan  Palliative Care Goals  Identify patient symptoms to help manage them  Ensure patient is as comfortable and active as possible  Assist and support patient and family with difficult medical decisions  Coordinate care and treatment among doctors during all stages of illness  Identify services that can support patient and family after completing                Palliative Care Plan Interventions/Education  Refer to Supportive Medicine  Clinic for consultation, if needed or requested  Education: Advance Care Planning - Discuss difference in hospice and palliative care, purpose of having an advance directive and POLST form completed and importance of having a discussion with health care provider most comfortable with. To be completed within 16 sessions.     30 Day Supportive Medicine Reassessment/Date: 11/20/22    Patient has completed advance directive and POLST forms - not on file  Patient has had a discussion with health care provider most comfortable with - not at this time     Interventions:  Patient provided care coordination with patients healthcare team.  Patient provided education on respiratory symptoms and strategies to help manage them  Patient provided information on difference in hospice and palliative care  Patient provided information on our Supportive Medicine clinic     Education: Advance Care Planning: pending     Progress toward Goals/Plan: Showing progress, Goals met, Goals not met  Continue to monitor patient progress with managing symptoms.  Continue to coordinate care and treatment among patients healthcare team.  Continue to assist and support patient and family with difficult medical decisions referring to Supportive Medicine clinic.      60 Day Supportive Medicine Reassessment/Date: 12/13/22 ***  Patient has completed advance directive and POLST forms  Patient has had a discussion with health care provider most comfortable with     Interventions:  Patient provided care coordination with patients healthcare team.  Patient provided education on respiratory symptoms and strategies to help manage them  Patient provided information on difference in hospice and palliative care  Patient provided information on our Supportive Medicine clinic     Education: Advance Care Planning:     Progress toward Goals/Plan: Showing progress, Goals met; Goals not met  Continue to monitor patient progress with managing symptoms.  Continue to  coordinate care and treatment among patients healthcare team.  Continue to assist and support patient and family with difficult medical decisions referring to Supportive Medicine clinic.     Supportive Medicine Discharge/Final/Date:      Patient verbalizes/demonstrates:  Importance of having an advance directive and POLST forms  How to recognize worsening respiratory symptoms and strategies to help manage them  Understanding the difference in hospice and palliative care     Future plans to maintain gains achieved during PR/ORS:  Health Management and Education class on Advance Care Planning  Progress toward Goals: Goals met; Goals not met        Patient  Outcomes  Initial Assessment  Date: 10/31/2022 30 Day Reassessment        Date: 11/20/22   60 Day   Reassessment        Date: 12/13/22 *** Discharge/Final           Date:                      Distance (meters)    120  Held until Discharge/Final Held until Discharge/Final     MPH 0.75         Max HR  119         Low SpO2   93% on RA          RPD 3         RPE 3         METS 1.5         Functional Fitness           Bicep Curls 13         Sit to Stand 10         Frail 8/22         Body Mass            Height (inches) 72.6         Weight (lbs.) 209   202       BMI (kg/m2) 26.8  26       Questionnaires            PHQ9  1  1       GAD7 1  0       [place] SOB 60  39       CAT (COPD & QoL) 21  12       ACT  15  22       STOP BANG 5         EPWORTH 5         MMRC 4 1              Initial Assessment signed by  Felicity Pellegrini, MHAL, BA, RRT-NPS, RCP, CES  Date: 10/31/2022     Initial Assessment/Medical Director  This patient was seen, evaluated and care plan was developed with the Pulmonary Rehabilitation Coordinator. I agree with the assessment and plan as outlined in her note.  Electronically signed: 10-31-2022  Ranae Plumber. Hardin MD    30 Day Reassessment/ITP prepared and signed by:  Patient continues to need current level of care and guidance on exercise progression, breathing  retraining, medications, nutrition and action plan.  Dewain Platz, MHAL, BA, RRT-NPS, RCP, CES  Date: 11/21/22    30 Day Reassessment/Medical Director  This patient was seen, evaluated and care plan was developed with the Pulmonary Rehab coordinator. I agree with the assessment and plan as outlined in her note.   Report electronically signed by Waldon Reining, MD   Signature/Date: 11-20-2022    *** Day Reassessment/ITP prepared and signed by:  Patient continues to need current level of care and guidance on ***.  ***  Date: ***    *** Day Reassessment/Medical Director  This patient was seen, evaluated and care plan was developed with the Pulmonary Rehab coordinator. I agree with the assessment and plan as outlined in her note.   Report electronically signed by Waldon Reining, MD   Signature/Date:

## 2022-12-16 ENCOUNTER — Ambulatory Visit: Payer: Medicare HMO

## 2022-12-18 ENCOUNTER — Ambulatory Visit: Payer: Medicare HMO

## 2022-12-20 ENCOUNTER — Ambulatory Visit: Payer: Medicare HMO | Attending: Internal Medicine

## 2022-12-20 DIAGNOSIS — J449 Chronic obstructive pulmonary disease, unspecified: Secondary | ICD-10-CM

## 2022-12-20 NOTE — Progress Notes (Signed)
Zoom Video Visit Note   Pulmonary Rehabilitation/Outpatient Respiratory Services Session Progress Note     Video Visit for Pulmonary Rehabilitation  I performed this clinical encounter by utilizing a real time telehealth video connection between my location and the patient's location due to COVID-19 Shelter-in recommended by the government. The patient's location was confirmed during this visit. I obtained verbal consent from the patient to perform this clinical encounter utilizing video and prepared the patient by answering any questions they had about the telehealth interaction.    Note Started: 12/20/2022, 1:39 PM  Date of Service: 12/20/2022    Name: Kenneth Gordon  Diagnosis:  COPD stage 4 group E  Video visit via Zoom conducted from 11:00 - 12:30 PM    Lecturer:  Orvan Falconer, LCSW, Supportive Medicine Clinic/Palliative Care    Education: Planning for the Future/Advanced Directives and Palliative Care   Today's lecture was on end of life care decisions. We defined the differences between palliative and hospice care and reviewed the advantages of both. We discussed the importance of making end-of-life decisions known to family and friends by filling out an Scientist, water quality. We discussed the advantages and disadvantages of an Advance Directive including the importance of choosing health care agents. We also discussed the Physician-Order for Life-sustaining Treatment (POLST) form and the importance of making this visible in the home in the event EMS is called for a life-threatening emergency. We reviewed the realities of CPR and it's potential to have debilitating outcomes. We looked at helpful tools to use when deciding what end of life care to choose: [employer].  [name] was very engaged and asked questions.      Report Completed by    Sharlot Gowda, BS, RRT  Pulmonary Rehabilitation Coordinator       Supervising physician: Dr. Deatra Robinson

## 2022-12-23 ENCOUNTER — Ambulatory Visit: Payer: Medicare HMO

## 2022-12-25 ENCOUNTER — Ambulatory Visit: Payer: Medicare HMO

## 2022-12-27 ENCOUNTER — Ambulatory Visit: Payer: Medicare HMO | Attending: Internal Medicine

## 2022-12-27 DIAGNOSIS — J449 Chronic obstructive pulmonary disease, unspecified: Secondary | ICD-10-CM

## 2022-12-27 NOTE — Progress Notes (Signed)
Zoom Video Visit Note   Pulmonary Rehabilitation/Outpatient Respiratory Services Session Progress Note     Video Visit for Pulmonary Rehabilitation  I performed this clinical encounter by utilizing a real time telehealth video connection between my location and the patient's location due to COVID-19 Shelter-in recommended by the government. The patient's location was confirmed during this visit. I obtained verbal consent from the patient to perform this clinical encounter utilizing video and prepared the patient by answering any questions they had about the telehealth interaction.    Note Started: 12/27/2022, 5:29 PM  Date of Service: 12/27/22    Name: Kenneth Gordon  Diagnosis:  COPD stage 4 group E  Video visit via Zoom conducted from 11:00 - 12:30 PM    Educator:  Felicity Pellegrini, MHAL, BA, RRT-NPS, RCP, CES  Education: Benefits of Exercise     The patients were introduced three main causes of exercise intolerance - pulmonary function, cardiovascular function and muscle metabolism. Discussed the key points on chronic respiratory disease and it's interplay with skeletal muscle dysfunction leading to exercise intolerance. Further explanation on consequence of muscle dysfunction and how training the peripheral muscles helps breathing despite no change in lung function. Reviewed the FITT formula, general exercise principles, utilization of proper breathing techniques, self-assessment during exercise and safety. Stressed importance of continuing with home exercise program after The PRP has concluded. Kenneth Gordon was attentive during the lecture and asked several questions.        Report Completed by    Felicity Pellegrini, MHAL, BA, RRT-NPS, RCP, CES      Supervising physician: Marcine Matar, NP

## 2022-12-30 ENCOUNTER — Ambulatory Visit: Payer: Medicare HMO

## 2022-12-30 NOTE — Progress Notes (Unsigned)
Pulmonary Disease Attending Note:    Interval history reviewed, patient seen and examined, and plan of care jointly formulated with Dr. Gypsy Decant on 12/31/2022. I independently reviewed all pertinent radiographic and laboratory data. I agree with Dr. Donnalee Curry note as detailed with any modifications I made.    In addition, the pt has significant COPD and  airtrapping. He is doing much better since starting PR. He is on triple inhaled therapy, and he is not having frequent AECOPDs. Recent PFTs show substantial airtrapping, and it may be time to consider EBVs. We will order a CT chest and run this through [facility]. Continue current therapy including PR. He also has some degree of sleep-disordered breathing and we will order a split night sleep study.    I did review the medical/surgical and social history     I personally reviewed: images, PFTs, echocardiogram, laboratory, and prior notes.    Other issues as detailed in Dr. Donnalee Curry note.    I educated/instructed the patient or caregiver regarding all aspects of the above stated plan of care.  The patient or caregiver indicated understanding. Overall, I spent 35 minutes on today's visit with 20 minutes directly with the patient discussing education and our plan for further treatment and evaluation.     We will plan to see Kenneth Gordon in COPD clinic in 6 months for follow-up.  Please do not hesitate to contact us with any additional questions or concerns.      Carlyon Shadow, MD, MAS  Professor of Medicine  Internal Medicine, Pulmonary Disease and Critical Care Medicine  [facility] Department of Internal Medicine  Preferred

## 2022-12-31 ENCOUNTER — Encounter: Payer: Self-pay | Admitting: Internal Medicine

## 2022-12-31 ENCOUNTER — Ambulatory Visit: Payer: Medicare HMO | Attending: Internal Medicine | Admitting: Internal Medicine

## 2022-12-31 VITALS — BP 139/68 | HR 86 | Temp 98.0°F | Resp 18 | Ht 72.0 in | Wt 200.4 lb

## 2022-12-31 DIAGNOSIS — J309 Allergic rhinitis, unspecified: Secondary | ICD-10-CM | POA: Insufficient documentation

## 2022-12-31 DIAGNOSIS — J439 Emphysema, unspecified: Secondary | ICD-10-CM | POA: Insufficient documentation

## 2022-12-31 DIAGNOSIS — Z7951 Long term (current) use of inhaled steroids: Secondary | ICD-10-CM | POA: Insufficient documentation

## 2022-12-31 DIAGNOSIS — Z23 Encounter for immunization: Secondary | ICD-10-CM | POA: Insufficient documentation

## 2022-12-31 DIAGNOSIS — G4730 Sleep apnea, unspecified: Secondary | ICD-10-CM | POA: Insufficient documentation

## 2022-12-31 DIAGNOSIS — J449 Chronic obstructive pulmonary disease, unspecified: Secondary | ICD-10-CM | POA: Insufficient documentation

## 2022-12-31 DIAGNOSIS — Z79899 Other long term (current) drug therapy: Secondary | ICD-10-CM | POA: Insufficient documentation

## 2022-12-31 DIAGNOSIS — R0989 Other specified symptoms and signs involving the circulatory and respiratory systems: Secondary | ICD-10-CM | POA: Insufficient documentation

## 2022-12-31 DIAGNOSIS — Z87891 Personal history of nicotine dependence: Secondary | ICD-10-CM | POA: Insufficient documentation

## 2022-12-31 DIAGNOSIS — J4489 Other specified chronic obstructive pulmonary disease: Secondary | ICD-10-CM | POA: Insufficient documentation

## 2022-12-31 MED ORDER — FLUTICASONE PROPIONATE 50 MCG/ACTUATION NASAL SPRAY,SUSPENSION: 1.0000 | Freq: Every day | NASAL | 3 refills | Status: DC

## 2022-12-31 NOTE — Patient Instructions (Signed)
Ctn inhalers as prescribed     Start flonase at maintenance therapy     Finish pulm rehab

## 2022-12-31 NOTE — Nursing Note (Signed)
Patient was verified with 2 identifiers, vital signs taken, allergies verified, and patient was screened for pain. Patient comes in with walker.        Preferred location for nebulizer medication [facility].    Patient is in ROOM # 1    Patient was NOT wearing a surgical mask        Shari Prows, MA

## 2022-12-31 NOTE — Progress Notes (Signed)
[facility]      Dear Dr. Maple Hudson,    We had the pleasure of seeing Kenneth Gordon in the [facility] today. As you recall Kenneth Gordon is a pleasant 77 year old man with a significant history of  COPD. Kenneth Gordon was last seen on 04/09/2022. PFT and 6 min walk test were ordered for patient.     Subjective/Interval Events:   Since that visit, patient was enrolled in [facility] starting on 10/31/2022. Patient states that he quit rehab after having difficulty with stand-sit exercise. Patient reports pain in his stomach, standing he's holding off on therapy for now given acute worsening of abdominal pain 12/13/2022, going or CTAP.  Patient pending repeat LDCT. Patient underwent and repeat spirometry. Patient reports he is feeling pretty good last last couple weeks. Patinet states that learning from [facility] rehab, and somewhat feeling better.     Today reports some intermittent post nasal drip and mild cough with slight increase in dypsnea. Uses a nasal spray as needed. Reports some reflux symptoms, however states good resolution w/ famotidine. Reports some allergy/symptom increase when the gardner is cutting the lawn.     Got flu shot today in clinic.     STOPBANG 5 (12/31/2022)    CAT from 04/09/2022: 24   COPD Assessment Test  12/31/2022     Range  Score   I never cough 0 - 5 I cough all the time 2   I have no phlegm (mucus) in my chest at all 0 - 5 My chest is completely full of phlegm (mucus) 1   My chest does not feel tight at all 0 - 5 My chest feels very tight 1   When I walk up a hill or one flight of stairs I am not breathless 0 - 5 When I walk up a hill or one flight of stairs I am very breathless 5   I am not limited doing any activities at home 0 - 5 I am very limited doing activities at home 3   I am confident leaving my home despite my lung condition 0 - 5 I am not at all confident leaving my home because of my lung condition 1   I sleep soundly 0  - 5 I don't sleep soundly because of my breathing 0   I have lots of energy 0 - 5 I have no energy at all 3     Total Score 16          Interval Exacerbations: n/a    PR: patient started 10/2022, reports he feels better since starting rehab, however states he stopped d/t some exercises because of abdominal pain.     Pertinent respiratory medications:  Symbicort 2 puff BID  Spiriva Respimat 2 spray dailys  Combivent QID    Pertinent respiratory hygiene:      Pertinent laboratory and other data:    A1AT- 186 mg/dL  Phenotype [id number]  CRP 0.5  RAST Panel- negative   AECs 100-500  IgE 75.8        PFT 12/10/2022             12/10/2022       CXR 10/22/2021. Hyperinflated lungs.      CXR 04/08/19   - Pulmonary hyperinflation c/w COPD  - No nodules by CXR     TTE 08/04/17  1. The LV systolic function is normal. The estimated LV ejection fraction is  60 %.  2. Trivial pericardial effusion.  3. Decreased left ventricular internal cavity size.  4. Mild aortic valve sclerosis without stenosis.  5. Mild concentric left ventricular hypertrophy.  6. The left ventricular diastolic function is abnormal. Stage I: Impaired early left ventricular relaxation (Abnormal relaxation pattern).  7. The left atrium is normal in size and structure.  8. The right atrium is normal in size and structure.     PFT 10/06/17    Very severe COPD is confirmed by reduced expiratory flow rates, volumes, and the flow volume loop.  Severe lung hyperinflation. DLco is severely reduced probably because of pulmonary emphysema but anemia, ILD, pulmonary vascular and drug-induced lung disease should remain a concern.      08/03/18              I did review patient's past medical and family/social history, no changes noted except as described above.  Previous clinic and hospital notes were reviewed.     Review of Systems     Review of Systems  All systems were reviewed and were negative unless otherwise noted in the HPI and above.    Medications and Allergies      Medications Taking[1]    Allergies[2]  Medication reconciliation was performed today.    Vitals & Physical Exam     Vital Signs: BP 139/68 (SITE: right arm, Orthostatic Position: sitting, Cuff Size: regular)   Pulse 86   Temp 36.7 C (98 F) (Temporal)   Resp 18   Ht 1.829 m (6')   Wt 90.9 kg (200 lb 6.4 oz)   SpO2 96%   BMI 27.18 kg/m      Physical Exam  Vitals reviewed.   Constitutional:       Appearance: He is not ill-appearing.      Comments: Caregiver at visit   HENT:      Head: Normocephalic and atraumatic.      Right Ear: External ear normal.      Left Ear: External ear normal.      Nose: Nose normal. Mild congestion      Mouth/Throat:      Mouth: Mucous membranes are moist.      Pharynx: Oropharynx is clear.   Eyes:      Extraocular Movements: Extraocular movements intact.      Conjunctiva/sclera: Conjunctivae normal.      Pupils: Pupils are equal, round, and reactive to light.   Cardiovascular:      Rate and Rhythm: Normal rate and regular rhythm.      Heart sounds: No murmur heard.  Pulmonary:      Comments: Diminished air movement b/l  Abdominal:      General: Abdomen is flat.      Palpations: Abdomen is soft.   Musculoskeletal:      Right lower leg: No edema.      Left lower leg: No edema.   Skin:     Comments: Dry scaling rash on LEs   Neurological:      Mental Status: He is alert and oriented to person, place, and time.      Comments: Sitting on a walker   Psychiatric:         Mood and Affect: Mood normal.         Behavior: Behavior normal.    Labs & Studies       Lab Results   Component Value Date    WBC 7.0 10/22/2021    HGB 14.5 10/22/2021  PLT 215 10/22/2021     No results found for: "HCO3"  Peripheral Eos:   Lab Results   Component Value Date    AUTOEOS 1.3 04/08/2019    AUTOEOSABS 0.1 04/08/2019    AUTOEOSABS 0.2 02/18/2018    AUTOEOSABS 0.2 07/22/2017     IgE:   Lab Results   Component Value Date    IGE 75.8 (H) 10/16/2017         I personally reviewed: images, PFTs, echocardiogram,  laboratory, and prior notes.    Assessment and Plan       (J44.9) Chronic obstructive pulmonary disease, unspecified COPD type (HCC)  (primary encounter diagnosis): The pt has very severe airflow limitation and a prior heavy smoking history. He quit smoking in 2021. He is currently on triple inhaled therapy which is appropriate, though he is not fully controlled. Was Rx'd PR which patient reports that subjectively improved and his CAT demonstrates improvement from 24-->16. At this time, patient is stable, will plan to ctn to treat COPD w/ current triple therapy, along with optimization of comorbidities. Patient with persistent dypsnea on exertion likely d/t dynamic hyperinflation. Repeat revealed 168 m, along with repeat PFTs 11/2022 which demonstrate FEV1/FVC 30% , FEV1 Z score -4.16 FRC 174% pred, RV 217% pred, some progression of obstruction from previous. Given his PFTs demonstrating severe obstruction/    -Split Sleep Study Ordered to evaluate for OSA   -Ctn PR   -Ordered HRCT for evaluation for EBV   -Ctn bronchodilator therapy below                 GOLD grade: IV; group: E                 Phenotype:  Ephysema, asthma overlap, and frequent exacerbator     Clinician estimated probability of severe COPD exacerbation (ie, ED or hospital admission) in the following 90 days: moderate     Bronchodilator therapy:   Proper bronchodilator use was reviewed with the patient. The current regimen includes:               Symbicort              Spiriva Respimat              Combivent                In patients with severe air trapping or diminished PIF, nebulized bronchodilators and inhaled corticosteroids can offer superior drug delivery.     Airway Clearance: None     Comorbidity Screening: patients with COPD are at an increased risk for multiple concurrent diseases which can exacerbate dyspnea and symptoms.     Alpha-1 Antitrypsin Deficiency Screening: [id number]     Pulmonary Rehabilitation: Pulmonary rehabilitation  programs involve exercise training, education, and behavior change and have been shown to decrease dyspnea, improve health-related quality of life, decrease days of hospitalization, and decrease health-care utilization. The optimal results are seen with motivated patients.   This has been offered to the patient.      Regardless of participation, continued activity and exercise is recommended for all patients with COPD.      Lung Cancer Screening: Lung cancer screening with low dose CT scan is indicated annually for patients aged 98 to 80 years who have over 30 pack-year of tobacco smoking and who are active smokers or quit within the past 15 years.     Mechanical Ventilation: Consideration of nocturnal mechanical ventilation is warranted in patients  with a resting PaCO2 >45 when not in exacerbation. Prior to initiation, patients should be screened for obstructive sleep apnea (OSA) with an Epworth sleepiness and STOP-BANG and a sleep study if indicated.      Lung Volume Reduction Interventions (Endobronchial valves and surgery):      The efficacy of lung volume reduction surgery (LVRS) varied among patient groups, but there was an overall survival advantage that was most marked in patients with upper lobe emphysema and low exercise capacity (but FEV1 >20% and DLCO >20% predicted).                 Endobronchial valves are indicated in patients with persistent, severe symptoms despite maximal medical therapy (including pulmonary rehabilitation) with evidence of hyperinflation (TLC >100% and RV >175%) and CT demonstrated severe emphysema and fissure completeness.  In the appropriately selected patient, this procedure has been shown to improve symptoms, FEV1, and 6 minute walk distance, but has a risk of pneumothorax of ~30%.  Our process for evaluation is therefore methodical, requiring a a 1 mm contiguous non-contrast CT of the chest and PFTs within the past year, subsequent analysis for fissure completeness with CT  analysis and potential bronchoscopic evaluation, and confirmation of maximal medical therapy. Patients are then evaluated in a multidisciplinary team for candidacy.       Lung Transplantation Evaluation: NA     Exacerbation Prevention:  Roflumilast and chronic azithromycin (and to a lesser degree NAC) use are generally limited to COPD patients with recurrent exacerbations despite maximally tolerated inhaled therapies.       Palliative Care: disease trajectory is often difficult to predict in COPD. Frequent discussions with patients with severe or deteriorating COPD regarding their wishes is recommended.    #Allergic Rhinitis   Ongoing symptoms w/ clear nasal mucous drainage  -ctn maintenance flonase qdaily       Pneumococcal vaccinations:            I educated/instructed the patient or caregiver regarding all aspects of the above stated plan of care.  The patient or caregiver indicated understanding. Overall, I spent 35 minutes on today's visit with 20 minutes directly with the patient discussing education and our plan for further treatment and evaluation.     We will plan to see KATIE MOCH in COPD clinic in 6 months for follow-up.  Please do not hesitate to contact us with any additional questions or concerns.     Sincerely,      Milton Ferguson, MD  PGY2       [address]  [place], [address]  Phone: [phone]  Fax: [phone]              [1]   Outpatient Medications Marked as Taking for the 12/31/22 encounter (Office Visit) with Carlyon Shadow, MD   Medication Sig Dispense Refill    Albuterol/Ipratropium (COMBIVENT RESPIMAT) 20-100 mcg/actuation Inhaler Take 1 puff by inhalation 4 times daily. 12 g 3    Aspirin 81 mg Chewable Tablet Take 81 mg by mouth every day.      Budesonide/Formoterol (SYMBICORT) 160-4.5 mcg/actuation Inhaler Take 2 puffs by inhalation 2 times daily. 30.6 g 3    Fluticasone (FLONASE) 50 mcg/actuation nasal spray Instill 1 spray into EACH nostril every day. 16 g 3     Lisinopril (PRINIVIL, ZESTRIL) 30 mg tablet Take 1 tablet by mouth every day. Please keep appointment in clinic and have labs drawn prior to further refills. 90 tablet 0  Rosuvastatin (CRESTOR) 20 mg Tablet Take 1 tablet by mouth every day. (cholesterol) 90 tablet 3   [2] No Known Allergies

## 2022-12-31 NOTE — Nursing Note (Signed)
The Inactivated Influenza Vaccine VIS document for the Influenza Vaccine was given to the patient/caregiver or legal representative to review. All questions were answered or referred to the physician.    The patient has no contraindications to receive the Influenza Vaccine.The Influenza Vaccine was administered per Policy 504.     The patient was observed for 15 minutes for immediate reactions to the vaccine per protocol. No reaction was observed.      Syrena Burges, LVN

## 2023-01-01 ENCOUNTER — Ambulatory Visit: Payer: Medicare HMO

## 2023-01-06 ENCOUNTER — Telehealth: Payer: Self-pay

## 2023-01-06 ENCOUNTER — Ambulatory Visit: Payer: Medicare HMO | Attending: Internal Medicine

## 2023-01-06 NOTE — Pulmonary Rehab Treatment Plan (Addendum)
INDIVIDUALIZED TREATMENT PLAN:   Pulmonary Rehabilitation/Outpatient Respiratory Therapy Services/Date: 10/31/2022  Kenneth Gordon is a 77yr old male with referring pulmonary diagnosis of COPD stage 4 Group E. Secondary pulmonary diagnosis included none. [name] was referred to the [facility]/Outpatient Respiratory Therapy service program by Dr. Karie Chimera.       Medical Necessity/Special Needs  Patients' pulmonary health history: Patient states he has more progressive SOB with moving about his home since 2021. Reports occasional cough, phlegm and chest tightness. His lung condition has greatly limited his activity level and is limited doing activities around the house. Mostly sedentray.  Pulmonary diagnostic tests completed: PFT,  CXR, A1AD = MM, IGE 75.8, RAST.  Cardiovascular tests completed: EKG, Echo, CT angioi, [place] LE, ABI     Smoking history:  Have you ever smoked or used tobacco? yes  Do you smoke currently? no  Year quit? 2021  Number packs per day? 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     Medical necessity: yes  Symptoms persistent despite medical therapy: yes  Increased AE/hospitalizations: no  Functional limits related to chronic lung disease: yes  HRQoL impairment: yes     Lab and test results reviewed with PR MD in PR Intake note DOS 07/31/2022 and reviewed on 10/28/2022.     PFT date: 10/06/2017  FVC 52%         FEV1 24%       FEV1/FVC 34%          TLC 79%         DLCO 39%      Adj DLCO 38%     Patient Goals: Patient states have less SOB with exertion, be more independent, walk half a mile      What activities would you like to be able to do if you could? Walk longer distances with less SOB  What are your family's goals for you? Stay independent     EXERCISE ASSESSMENT  Initial Exercise Assessment/Date: 10/31/2022  Current  exercise:  Patient currently exercises: none  Limitations/Barriers to exercise: SOB, muscle fatigue, muscle cramping  Exercise equipment at home: no  Currently activity level [id number]      Initial :   Distance (meters): 120             Speed: 0.75                 RPD/RPE: 3/3   METS: 1.5  Prior 6 MWD: 300                   Completed: 2019.  Functional Fitness tests:   One arm bicep curl: 13 reps with 8# DB    RPD/RPE: 2/2  Sit to stand: 10 reps    RPD/RPE: 4/4  Fall risk score: 8/22     Fall risk: Low  Uses cane, walker/rollator or wheelchair: Yes 4 wheelwalker     Physical deconditioning: Yes  No regular exercise/sedentary lifestyle: Yes  Knowledge deficit of exercise guidelines and safety: Yes  Decreased exercise tolerance: Yes  Exercise-induced hypoxemia: No     Pain Assessment:  Pain Problems                         Pain Score     Current treatment  ?Chest pain                            -                       -  ?  Incision pain                         -                       -  ?Leg pain                                4                      Stop and rest  ?Muscle/joint pain                  1-2                   Movement for stiff joints      Other                                   -                       -     Bone/Joint Problems               Pain Score      Current treatment  ? Arthritis                                -                       -  ? Knee Surgery                      -                       -   ? Back Surgery                       -                       -   ? Back Pain                            -                       -   ? Hip Replacement                 -                       -   ? Knee Replacement  -                       -   ? Leg Vascular Surgery         4                      surgery  ? Rotator Cuff                         -                       -  ?  Shoulder Pain                      -                       -  ? Other                                   -                        -     Exercise Plan  Exercise Goals:   Improve understanding of aerobic and resistance training and oxygen needs  Verbalize understanding of rate of perceived dyspnea (RPD) and rate of perceived exertion (RPE) as exercise guidelines for intensity.  Practice exercise prescription as advised by PR program coordinators under PR/ORS program medical director supervision.  Functional Capacity Goals:  Increase 6 MWD by > 30 meters  Increase physical activity > 20 min/day  Demonstrate proper dyspnea control with pursed lip breathing and pacing  Attend education on Benefits of Exercise     Exercise Interventions/Education  EXERCISE PRESCRIPTION  DO NOT EXERCISE PATIENT WITH PRE-EXERCISE VITALS OF:  BP > 140 or < 90/50  HR > 100 or < 60   SpO2 < 90   RR > 24 or < 8       Begin the following exercise modalities 2-3 times per week for 8 weeks as follows:   Energy manager for MET level of 1.0 to 1.5. Increase up to 10%/week. Keep SpO2 >92%, HR between 86 to 115 bpm, or 4-6/10 on Borg CR 10 scale.   Treadmill: Begin at 0.8 - 1.0 mph for 5 to 20 minutes   Recumbent Stepper: Begin at load of 1 for 5 to 20 minutes   Recumbent Bicycle: Begin at level 1 for 5 to 20 minutes  Arm Ergometer: Begin at level 1 for 5 to 20 minutes   Over ground walking: Begin for 5 to 20 minutes, self-paced     Note: Progress individualized exercise program based on participant responses to exercise, including HR, SpO2, BP, Borg rating and symptoms of exercise tolerance. Exercise intensity will be progressed individually and titrated based on 4-6/10 on Borg CR 10 scale. Progression of exercise training volume will result from increases in time, intensity, and frequency. Initial emphasis will be on increasing time.     Resistance Training Increase up to 10% when RPE is < 4, as tolerated to keep 4-6/10 on Borg CR 10 scale.  Resistance bands/Free weights/Weight machine  Begin with yellow resistance band and 5# weight. 1-3 sets of 10-15 reps.  Bicep  curls  Upright rows   Chest press  Back pull   Triceps extension  Reclined sit ups  Sit-to-Stands     Respiratory Muscle training   Frequency:  Twice daily, six days a week or as stated by your healthcare professional  Setting (I:E):  2/2  Time:  Strive to complete 3 sets of 10 repetitions  Type: Flow resistor     May order and administer Albuterol 0.083%, 2.5mg /71ml unit dose via hand-held nebulizer times 1 PRN for wheezing and shortness of breath. Notify referring physician if symptoms do not improve or additional bronchodilator treatments are needed.      May utilize supplemental oxygen 1-4 liters/min via nasal cannula during exercise to keep SpO2 greater than  92%. NOTE: if needed, may use high flow nasal cannula, simple mask, oxy mask, oxymizer oxygen conserver if more comfortable and if indicated.      May utilize [employer] Life2000 ventilation system. Mode: A/C, Vt = Rest/Low activity 150 mL, Medium activity 180 mL, High activity 200 mL, PEEP 0-5 cm H20, BR 0-20, I time 0.8-1.0 sec, Sensitivity 4. May adjust within +/- 75 mL of volume and/or O2 for each activity level as needed to maintain SpO2 >90% and adequate ventilation. Adjust to patient comfort for all Rxs.     May dispense and instruct patient on Oscillating PEP device as tolerated by HR, SpO2, RPD and RPE. PEP device opens weak or collapsed airways to mobilize and assist mucociliary clearance to the upper airways where it can be coughed out if indicated.      Review the exercise prescription based on initial evaluation findings, including results and functional fitness testing.  Monitor pain levels and adjust the exercise prescription as needed.  Adjust supplemental oxygen according to the Supplemental Oxygen Policy to maintain SpO2 > 92% during exercise.  Develop a personalized home-based exercise program.  Discuss home exercise guidelines and review the Physical Activity log.  Educate on proper use of respiratory medications before exercise.  Teach  paced breathing techniques for use during exercise and activities.  Discuss the impact of exercise on blood glucose levels and the optimal timing for exercise, if applicable.  Train patient on respiratory muscle training (RMT), if indicated.  Education on Benefits of Exercise - Review benefits and core components of exercise program, exercise guidelines and safety, RPD/RPE scales, breathing control strategies, warm up/cool down, equipment orientation, home exercise, O2 Use & safety, signs/symptoms to report. To be completed within 16 sessions.     30 Day Exercise Reassessment/Date: 11/20/22        Patient participated in # PR/ORS exercise sessions: 4  Patient able to complete # minutes of endurance exercise with fewer or no rest breaks: 20 min  Patient shows increased strength and endurance in both upper and lower extremities.  Patient maintains a daily home exercise log and develops a consistent home exercise routine of at least # additional days per week: 0  Modifications are required due to:  symptoms of exercise intolerance     Interventions:  Taught patient breathing retraining techniques with a return demonstration.  Educated patient on how to monitor SpO2 using a pulse oximeter and manage oxygen use if needed.  Provided guidance on the proper use of respiratory medications before exercise if necessary.  Instructed on paced breathing techniques for exercise and daily activities.  Taught patient how to monitor pain using a pain scale and adjust the exercise prescription as needed.     Exercise progression:  11/11/22 - Patient tolerated first day of group exercise. NS load 1 for 15 min and UBE with yellow band 1 x 10 reps.  11/20/22 - Patient progress to 20 min on NS  load 1. Patient having some challenges with PLB and pacing. Will modify cardio to interval training of 1-2 min work with 2-3 min rest.Patient progressed in UBE for 2 x 12 reps. Sit to stands very challenging for patient. Will reduce reps to 2-3 sets  of 3.      Education  How the Lungs Work: ATS handouts along with review during Breathing Retraining/Airway Clearance education   Benefits of Exercise: pending     Progress toward Goals/Plan: Showing progress  Exercise progression Rx:   Maintain recumbent stepper  workload to 1 with 1-2 min work/rest intervals and Maintain time for 20 minutes as tolerated by HR, SpO2, RPD and RPE rating.20  Maintain bicep curls at 5# and Maintain sets and reps 2 x 10-12 as tolerated by HR, SpO2, RPD and RPE rating.  Maintain upright row, chest, back and triceps exercises to yellow band and Maintain 2 sets x 10-12 reps as tolerated by HR, SpO2, RPD and RPE rating.  Maintain static and/or dynamic balance exercises to 1 sets x 10-15 seconds/reps as tolerated by HR, SpO2, RPD and RPE rating.     60 Day Exercise Reassessment/Date: 12/13/22   Patient participated in # PR/ORS exercise sessions: 4  Exercise tolerance not progressing since initial assessment due other symptoms related to abdominal discomfort/bloating. Patient met with PCP yesterday. An order for CT of pelvis abdomen chest and colonoscopy pending findings of CT scan. PR program on hold until further work up completed. Patient will contact us once he feels ready to resume.       Education  How the Lungs Work: ATS handouts along with review during Breathing Retraining/Airway Clearance education   Benefits of Exercise: Pending     Progress toward Goals/Plan: PR program on hold.      90 Day Exercise Reassessment/Date: 01/06/23  PR program on hold. Patient plans to continue pending CT results.  Exercise Discharge/Follow-up Date:   Patient participated in # PR/ORS exercise sessions.  Patient able to complete # minutes of endurance exercise with fewer or no rest breaks.  Patient shows increased strength in both upper and lower extremities.     Patient verbalizes/demonstrates how to:  Pursed lip breathing, paced breathing and diaphragmatic breathing during exercise and daily  activities.  Use the home exercise log with the breathlessness scale, strength, and balance exercise handouts.  Monitor SpO2 with a pulse oximeter and manage oxygen use if needed.  Use respiratory medications properly before exercise if needed.  Monitor pain using a pain scale and adjust the exercise prescription as needed.  Manage the effects of exercise on blood glucose levels and determine the optimal exercise schedule if applicable.  Use respiratory muscle training (RMT).     Patient attended education sessions on How the Lungs Work, Breathing Retraining Strategies, and the Benefits of Exercise.     Post (m):   Patient increased MET tolerance:  Reviewed home exercise plan with patient     Future plans to maintain gains achieved during PR/ORS:  Follow Home Exercise plan   Increase participation in physical activities  Health Management and Education Wellness classes  Join Virtual Maintenance exercise program  Exercise at nearby facility to patients home     Progress toward Goals: Goals met; Goals not met     NUTRITION ASSESSMENT  Initial Nutrition Assessment/Date: 10/31/2022  Body Composition  Admit Height: 72.6 inches  Admit Weight: 209 lbs.  Recent weight change: no   Patient would like to weight: 185   Difficulty with:  ? Shortness of breath during meals: - ? Shortness of breath after meals: -  Takes multivitamins: no  Takes other nutritional supplements: no, use to take protein shake when weight dropped to 145# years ago  Drinks alcohol: no     Patient review of nutrition habits/diet:   Has your illness or condition affected the kind and/or amount of food you eat? no   Do you eat fewer than 2 meals per day? no   Do you eat few fruits, vegetables, or milk products? no  Do you have tooth or mouth problems that make it hard for you to eat?  dentures   Are there times when you do not have enough money to buy the food you need? no   Do you eat alone most of the time? yes   Do you take 3 or more different  prescribed or over-the-counter drugs each day? yes   Without wanting to, have you lost or gained 10 or more pounds in the last 6 months? no   Are there times when you are not physically able to shop, cook, and/or feed yourself? no      Patient follows the following dietary Restrictions/Programs/Considerations? Reduce dairy products. Does not go downwell     Malnutrition Assessment (MNA) (Source: https://www.nestle.com)  Has food intake declined over the past 3 months due to loss of appetite, digestive problems, chewing or swallowing difficulties: 2  0 = severe decrease in food intake  1 = moderate decrease in food intake  2 = no decrease in food intake  Weight loss during the past 3 months: 3  0 = greater than 3 kg (6.6 lbs)   1 = does not know  2 = weight loss between 1-3 kg (2.2 to 6.6 lbs.)  3 = no weight loss  Mobility: 2  0 = bed or chair bound  1 = able to get out of bed/chair but does not go out   2 = goes out  Has suffered psychological stress or acute disease in the past 3 months: 2  0 = yes  2 = no  Neuropsychological problems: 2  0 = severe dementia or depression  1 = mild dementia   2 = no psychological problems  BMI: 26.8 kg/m2: 3  0 = BMI less than 19  1 = BMI 19 to less than 21  2 = BMI 21 to less than 23  3 = BMI 23 or greater  Total MNA score: 14  (12-14 Normal nutrition status, 8-11 At risk of malnutrition, 0-7 Malnourished)     Knowledge deficit in management of:  Diabetes: No                 A1C: -  CHF: No                        Base weight: -  Osteoporosis: No          Vitamin D/ Calcium supplement: No     Nutrition Plan  Nutrition Goals  Maintain weight  Show progress to healthier BMI range 21-30 kg/m2  Enhance understanding of the role of exercise in weight control.  Increase knowledge of weight management while on prednisone, if applicable.  Improve self-management skills for diabetes, if relevant.  Improve self-management skills for congestive heart failure (CHF), if applicable.  Attend  education on Nutrition education     Nutrition Interventions/Education  Monitor weight during PR/ORS program.  Review weight control strategies.  Provide education using a 24-hour or 3-day Food Diary for weight and nutrition monitoring.  Set goals for weight loss of 1-2 lbs/week, weight gain of 1-2 lbs/week, or weight maintenance as appropriate.  Refer to a registered dietitian (RD) as needed  Education on Nutrition - role of nutrition in managing chronic lung disease, follow diet plan, incorporating http://www.wall-moore.info/, reading nutrition labels, portion control, importance of adequate fluid intake, strategies form managing breath during and after meals, strategies for making healthy dietary changes and strategies for increasing calories to  encourage weight gain, role of supplements and prednisone. To be completed within 16 sessions.     30 Day Nutrition Reassessment/Date: 11/20/22    Current Weight (lbs.): 202.6  Weight loss: 7 #     Interventions:  Monitored patients weight on weekly  basis  Provided patient with instructions on 24 hour food diary.  Provided patient with information on the connection between dietary adherence, weight management and exercise adherence.  Patient reports no change in eating habits (quantity/type)  11/20/22 - Patient has GERD managed with Pepcid. Patient also has episodes of bloating/constipation. Will see PCP next week for further follow up.    Education: Nutrition Basics: Pending     Progress toward Goals/Plan: Showing progress  Encourage patient to completed 24 hour food diary. Encourage to increase whole foods, food preparation at home and optimal hydration.     60 Day Nutrition Reassessment/Date: 12/13/22   Patient with symptoms related to abdominal discomfort/bloating. Patient met with PCP yesterday. An order for CT of pelvis abdomen chest and colonoscopy pending findings of CT scan. PR program on hold until further work up completed. Patient will contact us once he feels ready to resume.       Interventions:  Provided patient with instructions on 24 hour or 3-day food diary.  Provided patient with information on the connection between dietary adherence, weight management and exercise adherence.     Education: Nutrition Basics: Completed 12/13/22     Progress toward Goals/Plan: PR program on hold  Encourage patient to completed 24 hour or 3-day food diary. Encourage to increase whole foods, food preparation at home and optimal hydration. Reinforce concepts taught in nutrition training.      90 Day Nutrition Reassessment/Date: 01/06/23  PR program on hold. Patient plans to continue pending CT results.   Nutrition Discharge/Follow-up/Date:   Current Weight (lbs.):  Maintaining weight  Weight gain:     Weight loss:     Patient verbalizes/demonstrates understanding:  Connection between dietary adherence, weight management and exercise adherence.  Diabetes/CHF/Osteoporosis management.  Importance of optimal protein consumption to support bone health and muscle strength.  Importance of optimal fluid intake.     Future plans to maintain gains achieved during PR/ORS:  Health Management and Education Nutrition classes  Increase participation in physical activities  Health Management and Education Wellness classes  Join Virtual Maintenance exercise program  Exercise at nearby facility to patients home     Progress toward Goals: Goals met; Goals not met     PSYCHOSOCIAL ASSESSMENT  Initial Psychosocial Assessment/Date: 10/31/2022  PHQ9:  1.  Indicates a potential for minimal depression.  GAD7: 1. Indicates a potential for  minimal anxiety.  Patient currently receiving mental health counseling: No  Patient currently using anti-depression / anti-anxiety medications: N/A  Patient reports special concerns/stressors experiencing at this time: no   Concerns/stressors that apply: -      Major source(s) of emotional support (Name/Relationship): Family       Living situation:  Patient lives alone  Patients living situation:  independent;  Patient reports social isolation related to symptoms of shortness of breath and/or use of supplemental oxygen: No  Patient transportation: son     Stage of change assessment: Preparation.     Psychosocial Plan  Psychosocial Goals  Improvement in PHQ-9 score: Decrease to/Maintain < 5 PHQ9 score  Improvement in GAD-7 score: Decrease to/Maintain < 5 GAD score  Adherent with appointments with mental health counselor, as needed    Adherent with appointments with PCP  for medication assistance, as needed   Adherence with anti-anxiety medications/anti-depression medications  Acquire new skills to cope with shortness of breath (SOB) and anxiety.  Develop new stress management techniques.  Report a subjective improvement in accepting the disease process and its management.  Attend Emotional Health and Wellbeing education     Psychosocial Interventions/Education  Offer a variety of learning methods, including reading, note-taking, watching videos, discussing topics with others, hands-on experience, and interactive online resources.  Review screening tool results with the patient and notify the stress management counselor and physician if the PHQ-9 score is 10 or greater.  Engage the patient in continuous assessment of their psychosocial status.  Provide information about support groups and health system/community resources.  Encourage group participation and sharing while respecting privacy.  Inform the patient that their spouse or support person may attend education sessions.  Train the patient in relaxation techniques and/or stress management strategies.  Discuss how regular exercise, good nutrition, and adequate sleep contribute to stress management.  Refer the patient to Mental Health or Social Services for one-on-one counseling if needed.  Refer the patient to their primary care provider (PCP) for prescription assistance if needed.  Provide information on Health Management and Education classes  Education on  Emotional Health and Wellbeing - coping techniques, relaxation techniques, breathing strategies to control dyspnea. To be completed within 16 sessions     30 Day Psychosocial Reassessment/Date: 11/20/22    PHQ-9 score: 1  GAD score: 0  Patient verbalizes/demonstrates management of anxiety/depression.  Patient is satisfied with current support system.     Interventions:  Informed patient that their spouse or support person may attend education sessions.  Discussed how regular exercise, good nutrition, and adequate sleep contribute to stress management.  Encouraged group participation and sharing while respecting privacy.  11/20/22 - Patient states he is enjoying the class and learning a lot.     Education: Emotional Health and Well-being: pending     Progress toward Goals/Plan: Showing progress  Continue to monitor PHQ9, GAD.   Reinforce tools to cope with SOB, anxiety and tools for stress management.     60 Day Psychosocial Reassessment/Date: 12/13/22   PHQ-9 score: P  GAD score: P  Patient verbalizes/demonstrates management of anxiety/depression.  Patient is satisfied with current support system.  Patient remains very positive and attends the educational sessions      Interventions:  Informed patient that their spouse or support person may attend education sessions.  Discussed how regular exercise, good nutrition, and adequate sleep contribute to stress management.  Provided information about support groups and health system/community resources.  Encouraged group participation and sharing while respecting privacy.     Education: Emotional Health and Well-being: completed 12/06/22     Progress toward Goals/Plan: PR program on hold  Continue to monitor PHQ9, GAD. Reinforce tools to cope with SOB, anxiety and tools for stress management.     60 Day Psychosocial Reassessment/Date: 01/06/23  PR program on hold. Patient plans to continue pending CT results.  Psychosocial Discharge/Follow-up/Date:   PHQ-9 score:  GAD score:      Patient verbalizes/demonstrates:  Acquired new skills to cope with shortness of breath (SOB) and anxiety.  Developed new stress management techniques.  Reports a subjective improvement in accepting the disease process and its management.  Adherent with appointments with mental health counselor, as needed    Adherent with appointments with PCP for medication assistance, as needed   Adherent with anti-anxiety medications/anti-depression medications  Education: Emotional Health and Well-being:     Future plans to maintain gains achieved during PR/ORS: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 recommendations & resources   Education: Conservation of Energy - educate patient in pacing, pursed lip breathing and energy conservation techniques to improve ability to perform daily activities with less shortness of breath. To be completed within 16 sessions.      30 Day ADL Reassessment/Date: 11/20/22    [place] SOB: 39  CAT: 12  MMRC: 1  Patient shows an improvement in ability to perform exercise and daily activities     Interventions:  Patient provided education on effective breathing, pacing while on NS and with UBE  Patient proved education/demonstration of appropriate use of walking aids for safety - patient uses 4 wheel walker  Patient provided education on equipment safety and with balance exercises     Education: Conservation of Energy: completed 11/15/22     Progress toward Goals/Plan:  Showing progress  Continue to monitor patient progress with daily activity management with control of dyspnea.  Continue to monitor patient progress with safe participation in exercise class and use of exercise equipment.  Continue to monitor patient progress with balance exercises.     60 Day ADL Reassessment/Date: 12/13/22   [place] SOB: P  CAT: P  MMRC : P  Patient shows an improvement in ability to perform exercise and daily activities AT HOME. Patient states using exercised in [place] booklet.     Interventions:  Patient provided education on effective breathing, pacing      Progress toward Goals/Plan:PR program on hold  Continue to monitor patient progress with daily activity management with control of dyspnea.  Continue to monitor patient progress with safe participation in exercise class and use of exercise equipment.  Continue to monitor patient progress with balance exercises.     90 Day ADL Reassessment/Date: 01/06/23  PR program on hold. Patient plans to continue pending CT results.  ADL Discharge/Follow/Date:   [place] SOB:   CAT:  MMRC:      Patient verbalizes/demonstrates:  Proper use of energy conservation techniques with daily activities and exercise   Safe participation in exercise class and use of exercise equipment and with balance exercises.     Future plans to maintain gains achieved during PR/ORS:  Patient to continue practice of the 4 Ps: Planning, Posture, Pacing and PLB with ADLs and exercise  Patient to continue safe and appropriate use of walking aids  Patient to continue to practice safety with balance exercises      Progress toward Goals: Goals met; Goals not met     Supportive Medicine ASSESSMENT  Initial Supportive Medicine Assessment/Date: yes  Patient has completed advance directive and POLST forms: yes and no  Patient understands difference between hospice and palliative care: No  Patient has had a discussion with health care provider most comfortable with: No     Palliative Care  Plan  Palliative Care Goals  Identify patient symptoms to help manage them  Ensure patient is as comfortable and active as possible  Assist and support patient and family with difficult medical decisions  Coordinate care and treatment among doctors during all stages of illness  Identify services that can support patient and family after completing                Palliative Care Plan Interventions/Education  Refer to Supportive Medicine Clinic for consultation, if needed or requested  Education: Advance Care Planning - Discuss difference in hospice and palliative care, purpose of having an advance directive and POLST form completed and importance of having a discussion with health care  provider most comfortable with. To be completed within 16 sessions.     30 Day Supportive Medicine Reassessment/Date: 11/20/22    Patient has completed advance directive and POLST forms - not on file  Patient has had a discussion with health care provider most comfortable with - not at this time     Interventions:  Patient provided care coordination with patients healthcare team.  Patient provided education on respiratory symptoms and strategies to help manage them  Patient provided information on difference in hospice and palliative care  Patient provided information on our Supportive Medicine clinic     Education: Advance Care Planning: pending     Progress toward Goals/Plan: Showing progress, Goals met, Goals not met  Continue to monitor patient progress with managing symptoms.  Continue to coordinate care and treatment among patients healthcare team.  Continue to assist and support patient and family with difficult medical decisions referring to Supportive Medicine clinic.      60 Day Supportive Medicine Reassessment/Date: 12/13/22   Patient has not completed advance directive and POLST forms  Patient has not had a discussion with health care provider most comfortable with     Interventions:  Patient provided care coordination with  patients healthcare team.  Patient provided education on respiratory symptoms and strategies to help manage them     Education: Advance Care Planning: pending     Progress toward Goals/Plan: PR program on hold  Continue to monitor patient progress with managing symptoms.  Continue to coordinate care and treatment among patients healthcare team.  Continue to assist and support patient and family with difficult medical decisions referring to Supportive Medicine clinic.     90 Day Supportive Medicine Reassessment/Date: 01/06/23     PR program on hold. Patient plans to continue pending CT results.    Supportive Medicine Discharge/Final/Date:      Patient verbalizes/demonstrates:  Importance of having an advance directive and POLST forms  How to recognize worsening respiratory symptoms and strategies to help manage them  Understanding the difference in hospice and palliative care     Future plans to maintain gains achieved during PR/ORS:  Health Management and Education class on Advance Care Planning     Progress toward Goals: Goals met; Goals not met        Patient  Outcomes  Initial Assessment  Date: 10/31/2022 30 Day Reassessment        Date: 11/20/22   60 Day   Reassessment        Date: 12/13/22  90 Day Reassessment Date: 01/06/23 Discharge/Final           Date:                       Distance (meters)    120  Held until Discharge/Final Held until Discharge/Final Held until Discharge/Final     MPH 0.75          Max HR  119          Low SpO2   93% on RA           RPD 3          RPE 3          METS 1.5          Functional Fitness            Bicep Curls 13          Sit to Stand 10  Frail 8/22          Body Mass             Height (inches) 72.6          Weight (lbs.) 209   202  Program on hold Program on hold     BMI (kg/m2) 26.8  26  Program on hold Program on hold     Questionnaires             PHQ9  1  1 Program on hold Program on hold     GAD7 1  0 Program on hold Program on hold     [place] SOB 60  39 Program  on hold Program on hold     CAT (COPD & QoL) 21  12 Program on hold Program on hold     ACT  15  22 Program on hold Program on hold     STOP BANG 5          EPWORTH 5          MMRC 4 1   Program on hold Program on hold           Initial Assessment signed by  Felicity Pellegrini, MHAL, BA, RRT-NPS, RCP, CES  Date: 10/31/2022     Initial Assessment/Medical Director  This patient was seen, evaluated and care plan was developed with the Pulmonary Rehabilitation Coordinator. I agree with the assessment and plan as outlined in her note.  Electronically signed: 10-31-2022  Ranae Plumber. Hardin MD    30 Day Reassessment/ITP prepared and signed by:  Patient continues to need current level of care and guidance on exercise progression, breathing retraining, medications, nutrition and action plan.  Maxima Skelton, MHAL, BA, RRT-NPS, RCP, CES  Date: 11/21/22    30 Day Reassessment/Medical Director  This patient was seen, evaluated and care plan was developed with the Pulmonary Rehab coordinator. I agree with the assessment and plan as outlined in her note.   Report electronically signed by Waldon Reining, MD   Signature/Date: 11-20-2022    60 Day Reassessment/ITP prepared and signed by:  Patient PR program on hold at this time due to abdominal/bloating. Testing in process. Patient wants to continue to in PR porgram and will contact us when ready to return. Patient continues to need current level of care and guidance on exercise progression, nutrition 24 hour food log, action plan and 3-day self assessment, medications, sleep and ACP.  Britt Petroni, MHAL, BA, RRT-NPS, RCP, CES  Date: 12/13/2022    60 Day Reassessment/Medical Director  This patient was seen, evaluated and care plan was developed with the Pulmonary Rehab coordinator. I agree with the assessment and plan as outlined in her note.   Report electronically signed by Waldon Reining, MD   Signature/Date: 12-16-2022    90 Day Reassessment/ITP prepared and signed by:  PR program on hold.  Patient plans to continue pending CT results. Will follow up with patient early next week. Patient wants to continue to in PR porgram and will contact us when ready to return. Patient continues to need current level of care and guidance on exercise progression, nutrition 24 hour food log, action plan and 3-day self assessment, medications, sleep and ACP.  Liberty Seto, MHAL, BA, RRT-NPS, RCP, CES  Date: 01/06/23    90 Day Reassessment/Medical Director  This patient was seen, evaluated and care plan was developed with the Pulmonary Rehab coordinator. I agree with the assessment and plan as outlined in  her note.   Report electronically signed by Waldon Reining, MD   Signature/Date: 01-06-2023

## 2023-01-06 NOTE — Telephone Encounter (Signed)
Pulmonary Rehabilitation Telephone Encounter     01/06/2023  2:04 PM    I have attempted to contact this patient with the following results: Three patient identifiers confirmed. Spoke to patient to schedule PR appointments. Patient stated he has his CT scan this Friday and will know more after the results are in. Will follow up with patient next week. Patient appreciative of the call.    Felicity Pellegrini, MHAL, BA, RRT-NPS, RCP, CES  Pulmonary Rehabilitation Program Supervisor  [phone]

## 2023-01-07 LAB — PULMONARY FUNCTION TEST COMPLETE  (PFT) - PULMONARY LAB
DLCO (PRE) PRED: 33 %
DLCO (PRE): 8.81 ml/(min*mmHg)
DLCOCSBVA0PRE: 2.19 ml/(min*mmHg*L)
DLCOCSBVAPREPRED: 56 %
DLCOCSINGLEBREATH0PRE: 8.81 ml/(min*mmHg)
DLCOCSINGLEBREATHPRED: 26.38
DLCOSINGLEBREATHPRED: 26.38
DLCOSINGLEBREATHPREPRED: 33 %
ERV (PRE): 1.24 L
FEF 25/75% (PRE): 0.25 L/s
FEF2575PRED: 2.16
FEV1 (PRE) % PRED: 24 %
FEV1 (PRE): 0.74 L
FEV1/FVC (PRE) % PRED: 40 %
FEV1/FVC (PRE): 29.84 %
FEV1PRED: 3.1
FVC (PRE) % PRED: 59 %
FVC (PRE): 2.48 L
FVCPRED: 4.21
RV % PRED: 2.89
RV (PRE): 6.26 L
TLC (PRE) % PRED: 116 %
TLC (PRE): 8.79 L
VA: 6.81
VASINGLEBREATH0PRE: 4.03 L
VASINGLEBREATHPREPRED: 59 %

## 2023-01-10 ENCOUNTER — Ambulatory Visit
Admission: RE | Admit: 2023-01-10 | Discharge: 2023-01-10 | Disposition: A | Discharge status: Home / Self Care | Payer: Medicare HMO | Source: Ambulatory Visit | Attending: VASC/INTRVN RADIOLOG | Admitting: VASC/INTRVN RADIOLOG

## 2023-01-10 DIAGNOSIS — R1084 Generalized abdominal pain: Secondary | ICD-10-CM | POA: Insufficient documentation

## 2023-01-10 DIAGNOSIS — R140 Abdominal distension (gaseous): Secondary | ICD-10-CM | POA: Insufficient documentation

## 2023-01-10 DIAGNOSIS — I700 Atherosclerosis of aorta: Secondary | ICD-10-CM | POA: Insufficient documentation

## 2023-01-10 DIAGNOSIS — J439 Emphysema, unspecified: Secondary | ICD-10-CM | POA: Insufficient documentation

## 2023-01-10 DIAGNOSIS — J449 Chronic obstructive pulmonary disease, unspecified: Secondary | ICD-10-CM | POA: Insufficient documentation

## 2023-01-10 DIAGNOSIS — I7143 Infrarenal abdominal aortic aneurysm, without rupture: Secondary | ICD-10-CM | POA: Insufficient documentation

## 2023-01-10 DIAGNOSIS — R918 Other nonspecific abnormal finding of lung field: Secondary | ICD-10-CM | POA: Insufficient documentation

## 2023-01-10 MED ORDER — IOHEXOL 350 MG IODINE/ML INTRAVENOUS SOLUTION - 100 ML BOTTLE
INTRAVENOUS | Status: AC
Start: 2023-01-10 — End: 2023-01-10

## 2023-01-14 ENCOUNTER — Telehealth: Payer: Self-pay

## 2023-01-14 NOTE — Telephone Encounter (Signed)
Pulmonary Rehabilitation Telephone Encounter    01/14/2023 12:04 PM    Kenneth Gordon was contacted regarding Pulmonary Rehabilitation.  A voicemail was left for [name] to return call at [phone] or [phone]. LVM for patient following up on CT abdomen and plans for returning to PR program.    Felicity Pellegrini, MHAL, BA, RRT-NPS, RCP, CES, [facility]  Pulmonary Rehabilitation Program Supervisor  [phone]

## 2023-01-21 ENCOUNTER — Telehealth: Payer: Self-pay

## 2023-01-21 NOTE — Telephone Encounter (Signed)
Pulmonary Rehabilitation Telephone Encounter    01/21/2023 3:27 PM    Kenneth Gordon was contacted regarding Pulmonary Rehabilitation.  A voicemail was left for [name] to return call at [phone] or [phone]. Unable to reach patient. Recording stating patient has not set up mailbox. Unable to contact patient to follow up on his interest in continuing in PR program. Several attempts. Will close this patient ITP. If patient decides to start again will reach out of a new PR referral.    Hajime Asfaw, MHAL, BA, RRT-NPS, RCP, CES, [facility]  Pulmonary Rehabilitation Program Supervisor  [phone]

## 2023-01-28 ENCOUNTER — Encounter: Payer: Self-pay | Admitting: Family Medicine

## 2023-01-28 NOTE — Telephone Encounter (Signed)
Advised patient to call an advice nurse to discuss concerns/symptoms.  Attn [facility] Staff: When patient returns call, please transfer caller to PCN Triage line. Thank you.     See Care Advice Below  Care Advice            Constipation-ADULT-AH  Ruthe Mannan, RN Tue Jan 28, 2023 12:25 PM      Care Advice       STEP-BY-STEP:  * A step-by-step approach to using OVER-THE-COUNTER (OTC) MEDICINES for constipation is best.  * Before taking any medicine, read all the instructions on the package.    STEP 1 - FIBER LAXATIVES, EVERY DAY:  * You can take a fiber laxative instead of eating more fiber. An example of a fiber laxative is PSYLLIUM (Metamucil). Fiber can help soften your stools.  * Fiber works by holding more water in your stools.  * Be patient. Sometimes this takes a couple weeks before it starts to work.  * If constipation is more severe or bothersome, you can start Step 1 and 2 at the same time.    STEP 2 - USE AN OSMOTIC LAXATIVE IF NEEDED:  * You can take POLYETHYLENE GLYCOL (PEG, Miralax) or MILK OF MAGNESIA.  * This type of medicine helps pull water into your intestines. This softens the stools.    STEP 3 - ADD A STIMULANT LAXATIVE:  * If the constipation does not get better with the Care Advice in Steps 1 and 2, add a stimulant laxative.  * Use either BISACODYL (Dulcolax) or a GLYCERIN SUPPOSITORY.    CALL BACK IF:  * Constipation lasts more than 1 week after using Care Advice  * Abdomen swelling, vomiting or fever occur  * Constant or increasing abdomen pain  * You think you need to be seen  * You become worse                              See Protocol and Disposition Below  Reason for Disposition   Over-The-Counter (OTC) medicines for constipation, questions about    Protocols used: Constipation-ADULT-AH

## 2023-02-05 ENCOUNTER — Ambulatory Visit: Payer: Medicare HMO | Attending: Internal Medicine

## 2023-02-05 NOTE — Progress Notes (Signed)
INDIVIDUALIZED TREATMENT PLAN:   Pulmonary Rehabilitation/Outpatient Respiratory Therapy Services/Date: 10/31/2022  Kenneth Gordon is a 77yr old male with referring pulmonary diagnosis of COPD stage 4 Group E. Secondary pulmonary diagnosis included none. Kenneth Gordon was referred to the [facility]/Outpatient Respiratory Therapy service program by Dr. Karie Chimera.       Medical Necessity/Special Needs  Patients' pulmonary health history: Patient states he has more progressive SOB with moving about his home since 2021. Reports occasional cough, phlegm and chest tightness. His lung condition has greatly limited his activity level and is limited doing activities around the house. Mostly sedentray.  Pulmonary diagnostic tests completed: PFT,  CXR, A1AD = MM, IGE 75.8, RAST.  Cardiovascular tests completed: EKG, Echo, CT angioi, [place] LE, ABI     Smoking history:  Have you ever smoked or used tobacco? yes  Do you smoke currently? no  Year quit? 2021  Number packs per day? 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     Medical necessity: yes  Symptoms persistent despite medical therapy: yes  Increased AE/hospitalizations: no  Functional limits related to chronic lung disease: yes  HRQoL impairment: yes     Lab and test results reviewed with PR MD in PR Intake note DOS 07/31/2022 and reviewed on 10/28/2022.     PFT date: 10/06/2017  FVC 52%         FEV1 24%       FEV1/FVC 34%          TLC 79%         DLCO 39%      Adj DLCO 38%     Patient Goals: Patient states have less SOB with exertion, be more independent, walk half a mile      What activities would you like to be able to do if you could? Walk longer distances with less SOB  What are your family's goals for you? Stay independent     EXERCISE ASSESSMENT  Initial Exercise Assessment/Date: 10/31/2022  Current  exercise:  Patient currently exercises: none  Limitations/Barriers to exercise: SOB, muscle fatigue, muscle cramping  Exercise equipment at home: no  Currently activity level [id number]      Initial :   Distance (meters): 120             Speed: 0.75                 RPD/RPE: 3/3   METS: 1.5  Prior 6 MWD: 300                   Completed: 2019.  Functional Fitness tests:   One arm bicep curl: 13 reps with 8# DB    RPD/RPE: 2/2  Sit to stand: 10 reps    RPD/RPE: 4/4  Fall risk score: 8/22     Fall risk: Low  Uses cane, walker/rollator or wheelchair: Yes 4 wheelwalker     Physical deconditioning: Yes  No regular exercise/sedentary lifestyle: Yes  Knowledge deficit of exercise guidelines and safety: Yes  Decreased exercise tolerance: Yes  Exercise-induced hypoxemia: No     Pain Assessment:  Pain Problems                         Pain Score     Current treatment  ?Chest pain                            -                       -  ?  Incision pain                         -                       -  ?Leg pain                                4                      Stop and rest  ?Muscle/joint pain                  1-2                   Movement for stiff joints      Other                                   -                       -     Bone/Joint Problems               Pain Score      Current treatment  ? Arthritis                                -                       -  ? Knee Surgery                      -                       -   ? Back Surgery                       -                       -   ? Back Pain                            -                       -   ? Hip Replacement                 -                       -   ? Knee Replacement  -                       -   ? Leg Vascular Surgery         4                      surgery  ? Rotator Cuff                         -                       -  ?  Shoulder Pain                      -                       -  ? Other                                   -                        -     Exercise Plan  Exercise Goals:   Improve understanding of aerobic and resistance training and oxygen needs  Verbalize understanding of rate of perceived dyspnea (RPD) and rate of perceived exertion (RPE) as exercise guidelines for intensity.  Practice exercise prescription as advised by PR program coordinators under PR/ORS program medical director supervision.  Functional Capacity Goals:  Increase 6 MWD by > 30 meters  Increase physical activity > 20 min/day  Demonstrate proper dyspnea control with pursed lip breathing and pacing  Attend education on Benefits of Exercise     Exercise Interventions/Education  EXERCISE PRESCRIPTION  DO NOT EXERCISE PATIENT WITH PRE-EXERCISE VITALS OF:  BP > 140 or < 90/50  HR > 100 or < 60   SpO2 < 90   RR > 24 or < 8       Begin the following exercise modalities 2-3 times per week for 8 weeks as follows:   Energy manager for MET level of 1.0 to 1.5. Increase up to 10%/week. Keep SpO2 >92%, HR between 86 to 115 bpm, or 4-6/10 on Borg CR 10 scale.   Treadmill: Begin at 0.8 - 1.0 mph for 5 to 20 minutes   Recumbent Stepper: Begin at load of 1 for 5 to 20 minutes   Recumbent Bicycle: Begin at level 1 for 5 to 20 minutes  Arm Ergometer: Begin at level 1 for 5 to 20 minutes   Over ground walking: Begin for 5 to 20 minutes, self-paced     Note: Progress individualized exercise program based on participant responses to exercise, including HR, SpO2, BP, Borg rating and symptoms of exercise tolerance. Exercise intensity will be progressed individually and titrated based on 4-6/10 on Borg CR 10 scale. Progression of exercise training volume will result from increases in time, intensity, and frequency. Initial emphasis will be on increasing time.     Resistance Training Increase up to 10% when RPE is < 4, as tolerated to keep 4-6/10 on Borg CR 10 scale.  Resistance bands/Free weights/Weight machine  Begin with yellow resistance band and 5# weight. 1-3 sets of 10-15 reps.  Bicep  curls  Upright rows   Chest press  Back pull   Triceps extension  Reclined sit ups  Sit-to-Stands     Respiratory Muscle training   Frequency:  Twice daily, six days a week or as stated by your healthcare professional  Setting (I:E):  2/2  Time:  Strive to complete 3 sets of 10 repetitions  Type: Flow resistor     May order and administer Albuterol 0.083%, 2.5mg /1ml unit dose via hand-held nebulizer times 1 PRN for wheezing and shortness of breath. Notify referring physician if symptoms do not improve or additional bronchodilator treatments are needed.      May utilize supplemental oxygen 1-4 liters/min via nasal cannula during exercise to keep SpO2 greater than  92%. NOTE: if needed, may use high flow nasal cannula, simple mask, oxy mask, oxymizer oxygen conserver if more comfortable and if indicated.      May utilize [employer] Life2000 ventilation system. Mode: A/C, Vt = Rest/Low activity 150 mL, Medium activity 180 mL, High activity 200 mL, PEEP 0-5 cm H20, BR 0-20, I time 0.8-1.0 sec, Sensitivity 4. May adjust within +/- 75 mL of volume and/or O2 for each activity level as needed to maintain SpO2 >90% and adequate ventilation. Adjust to patient comfort for all Rxs.     May dispense and instruct patient on Oscillating PEP device as tolerated by HR, SpO2, RPD and RPE. PEP device opens weak or collapsed airways to mobilize and assist mucociliary clearance to the upper airways where it can be coughed out if indicated.      Review the exercise prescription based on initial evaluation findings, including results and functional fitness testing.  Monitor pain levels and adjust the exercise prescription as needed.  Adjust supplemental oxygen according to the Supplemental Oxygen Policy to maintain SpO2 > 92% during exercise.  Develop a personalized home-based exercise program.  Discuss home exercise guidelines and review the Physical Activity log.  Educate on proper use of respiratory medications before exercise.  Teach  paced breathing techniques for use during exercise and activities.  Discuss the impact of exercise on blood glucose levels and the optimal timing for exercise, if applicable.  Train patient on respiratory muscle training (RMT), if indicated.  Education on Benefits of Exercise - Review benefits and core components of exercise program, exercise guidelines and safety, RPD/RPE scales, breathing control strategies, warm up/cool down, equipment orientation, home exercise, O2 Use & safety, signs/symptoms to report. To be completed within 16 sessions.     30 Day Exercise Reassessment/Date: 11/20/22        Patient participated in # PR/ORS exercise sessions: 4  Patient able to complete # minutes of endurance exercise with fewer or no rest breaks: 20 min  Patient shows increased strength and endurance in both upper and lower extremities.  Patient maintains a daily home exercise log and develops a consistent home exercise routine of at least # additional days per week: 0  Modifications are required due to:  symptoms of exercise intolerance     Interventions:  Taught patient breathing retraining techniques with a return demonstration.  Educated patient on how to monitor SpO2 using a pulse oximeter and manage oxygen use if needed.  Provided guidance on the proper use of respiratory medications before exercise if necessary.  Instructed on paced breathing techniques for exercise and daily activities.  Taught patient how to monitor pain using a pain scale and adjust the exercise prescription as needed.     Exercise progression:  11/11/22 - Patient tolerated first day of group exercise. NS load 1 for 15 min and UBE with yellow band 1 x 10 reps.  11/20/22 - Patient progress to 20 min on NS  load 1. Patient having some challenges with PLB and pacing. Will modify cardio to interval training of 1-2 min work with 2-3 min rest.Patient progressed in UBE for 2 x 12 reps. Sit to stands very challenging for patient. Will reduce reps to 2-3 sets  of 3.      Education  How the Lungs Work: ATS handouts along with review during Breathing Retraining/Airway Clearance education   Benefits of Exercise: pending     Progress toward Goals/Plan: Showing progress  Exercise progression Rx:   Maintain recumbent stepper  workload to 1 with 1-2 min work/rest intervals and Maintain time for 20 minutes as tolerated by HR, SpO2, RPD and RPE rating.20  Maintain bicep curls at 5# and Maintain sets and reps 2 x 10-12 as tolerated by HR, SpO2, RPD and RPE rating.  Maintain upright row, chest, back and triceps exercises to yellow band and Maintain 2 sets x 10-12 reps as tolerated by HR, SpO2, RPD and RPE rating.  Maintain static and/or dynamic balance exercises to 1 sets x 10-15 seconds/reps as tolerated by HR, SpO2, RPD and RPE rating.     60 Day Exercise Reassessment/Date: 12/13/22   Patient participated in # PR/ORS exercise sessions: 4  Exercise tolerance not progressing since initial assessment due other symptoms related to abdominal discomfort/bloating. Patient met with PCP yesterday. An order for CT of pelvis abdomen chest and colonoscopy pending findings of CT scan. PR program on hold until further work up completed. Patient will contact us once he feels ready to resume.       Education  How the Lungs Work: ATS handouts along with review during Breathing Retraining/Airway Clearance education   Benefits of Exercise: Pending     Progress toward Goals/Plan: PR program on hold.      90 Day Exercise Reassessment/Date: 01/06/23  PR program on hold. Patient plans to continue pending CT results.  Exercise Discharge/Follow-up Date:   Patient participated in # PR/ORS exercise sessions.  Patient able to complete # minutes of endurance exercise with fewer or no rest breaks.  Patient shows increased strength in both upper and lower extremities.     Patient verbalizes/demonstrates how to:  Pursed lip breathing, paced breathing and diaphragmatic breathing during exercise and daily  activities.  Use the home exercise log with the breathlessness scale, strength, and balance exercise handouts.  Monitor SpO2 with a pulse oximeter and manage oxygen use if needed.  Use respiratory medications properly before exercise if needed.  Monitor pain using a pain scale and adjust the exercise prescription as needed.  Manage the effects of exercise on blood glucose levels and determine the optimal exercise schedule if applicable.  Use respiratory muscle training (RMT).     Patient attended education sessions on How the Lungs Work, Breathing Retraining Strategies, and the Benefits of Exercise.     Post (m):   Patient increased MET tolerance:  Reviewed home exercise plan with patient     Future plans to maintain gains achieved during PR/ORS:  Follow Home Exercise plan   Increase participation in physical activities  Health Management and Education Wellness classes  Join Virtual Maintenance exercise program  Exercise at nearby facility to patients home     Progress toward Goals: Goals met; Goals not met     NUTRITION ASSESSMENT  Initial Nutrition Assessment/Date: 10/31/2022  Body Composition  Admit Height: 72.6 inches  Admit Weight: 209 lbs.  Recent weight change: no   Patient would like to weight: 185   Difficulty with:  ? Shortness of breath during meals: - ? Shortness of breath after meals: -  Takes multivitamins: no  Takes other nutritional supplements: no, use to take protein shake when weight dropped to 145# years ago  Drinks alcohol: no     Patient review of nutrition habits/diet:   Has your illness or condition affected the kind and/or amount of food you eat? no   Do you eat fewer than 2 meals per day? no   Do you eat few fruits, vegetables, or milk products? no  Do you have tooth or mouth problems that make it hard for you to eat?  dentures   Are there times when you do not have enough money to buy the food you need? no   Do you eat alone most of the time? yes   Do you take 3 or more different  prescribed or over-the-counter drugs each day? yes   Without wanting to, have you lost or gained 10 or more pounds in the last 6 months? no   Are there times when you are not physically able to shop, cook, and/or feed yourself? no      Patient follows the following dietary Restrictions/Programs/Considerations? Reduce dairy products. Does not go downwell     Malnutrition Assessment (MNA) (Source: https://www.nestle.com)  Has food intake declined over the past 3 months due to loss of appetite, digestive problems, chewing or swallowing difficulties: 2  0 = severe decrease in food intake  1 = moderate decrease in food intake  2 = no decrease in food intake  Weight loss during the past 3 months: 3  0 = greater than 3 kg (6.6 lbs)   1 = does not know  2 = weight loss between 1-3 kg (2.2 to 6.6 lbs.)  3 = no weight loss  Mobility: 2  0 = bed or chair bound  1 = able to get out of bed/chair but does not go out   2 = goes out  Has suffered psychological stress or acute disease in the past 3 months: 2  0 = yes  2 = no  Neuropsychological problems: 2  0 = severe dementia or depression  1 = mild dementia   2 = no psychological problems  BMI: 26.8 kg/m2: 3  0 = BMI less than 19  1 = BMI 19 to less than 21  2 = BMI 21 to less than 23  3 = BMI 23 or greater  Total MNA score: 14  (12-14 Normal nutrition status, 8-11 At risk of malnutrition, 0-7 Malnourished)     Knowledge deficit in management of:  Diabetes: No                 A1C: -  CHF: No                        Base weight: -  Osteoporosis: No          Vitamin D/ Calcium supplement: No     Nutrition Plan  Nutrition Goals  Maintain weight  Show progress to healthier BMI range 21-30 kg/m2  Enhance understanding of the role of exercise in weight control.  Increase knowledge of weight management while on prednisone, if applicable.  Improve self-management skills for diabetes, if relevant.  Improve self-management skills for congestive heart failure (CHF), if applicable.  Attend  education on Nutrition education     Nutrition Interventions/Education  Monitor weight during PR/ORS program.  Review weight control strategies.  Provide education using a 24-hour or 3-day Food Diary for weight and nutrition monitoring.  Set goals for weight loss of 1-2 lbs/week, weight gain of 1-2 lbs/week, or weight maintenance as appropriate.  Refer to a registered dietitian (RD) as needed  Education on Nutrition - role of nutrition in managing chronic lung disease, follow diet plan, incorporating http://www.wall-moore.info/, reading nutrition labels, portion control, importance of adequate fluid intake, strategies form managing breath during and after meals, strategies for making healthy dietary changes and strategies for increasing calories to  encourage weight gain, role of supplements and prednisone. To be completed within 16 sessions.     30 Day Nutrition Reassessment/Date: 11/20/22    Current Weight (lbs.): 202.6  Weight loss: 7 #     Interventions:  Monitored patients weight on weekly  basis  Provided patient with instructions on 24 hour food diary.  Provided patient with information on the connection between dietary adherence, weight management and exercise adherence.  Patient reports no change in eating habits (quantity/type)  11/20/22 - Patient has GERD managed with Pepcid. Patient also has episodes of bloating/constipation. Will see PCP next week for further follow up.     Education: Nutrition Basics: Pending     Progress toward Goals/Plan: Showing progress  Encourage patient to completed 24 hour food diary. Encourage to increase whole foods, food preparation at home and optimal hydration.     60 Day Nutrition Reassessment/Date: 12/13/22   Patient with symptoms related to abdominal discomfort/bloating. Patient met with PCP yesterday. An order for CT of pelvis abdomen chest and colonoscopy pending findings of CT scan. PR program on hold until further work up completed. Patient will contact us once he feels ready to resume.       Interventions:  Provided patient with instructions on 24 hour or 3-day food diary.  Provided patient with information on the connection between dietary adherence, weight management and exercise adherence.     Education: Nutrition Basics: Completed 12/13/22     Progress toward Goals/Plan: PR program on hold  Encourage patient to completed 24 hour or 3-day food diary. Encourage to increase whole foods, food preparation at home and optimal hydration. Reinforce concepts taught in nutrition training.      90 Day Nutrition Reassessment/Date: 01/06/23  PR program on hold. Patient plans to continue pending CT results.   Nutrition Discharge/Follow-up/Date:   Current Weight (lbs.):  Maintaining weight  Weight gain:     Weight loss:     Patient verbalizes/demonstrates understanding:  Connection between dietary adherence, weight management and exercise adherence.  Diabetes/CHF/Osteoporosis management.  Importance of optimal protein consumption to support bone health and muscle strength.  Importance of optimal fluid intake.     Future plans to maintain gains achieved during PR/ORS:  Health Management and Education Nutrition classes  Increase participation in physical activities  Health Management and Education Wellness classes  Join Virtual Maintenance exercise program  Exercise at nearby facility to patients home     Progress toward Goals: Goals met; Goals not met     PSYCHOSOCIAL ASSESSMENT  Initial Psychosocial Assessment/Date: 10/31/2022  PHQ9:  1.  Indicates a potential for minimal depression.  GAD7: 1. Indicates a potential for  minimal anxiety.  Patient currently receiving mental health counseling: No  Patient currently using anti-depression / anti-anxiety medications: N/A  Patient reports special concerns/stressors experiencing at this time: no   Concerns/stressors that apply: -      Major source(s) of emotional support (Name/Relationship): Family       Living situation:  Patient lives alone  Patients living situation:  independent;  Patient reports social isolation related to symptoms of shortness of breath and/or use of supplemental oxygen: No  Patient transportation: son     Stage of change assessment: Preparation.     Psychosocial Plan  Psychosocial Goals  Improvement in PHQ-9 score: Decrease to/Maintain < 5 PHQ9 score  Improvement in GAD-7 score: Decrease to/Maintain < 5 GAD score  Adherent with appointments with mental health counselor, as needed    Adherent with appointments with  PCP for medication assistance, as needed   Adherence with anti-anxiety medications/anti-depression medications  Acquire new skills to cope with shortness of breath (SOB) and anxiety.  Develop new stress management techniques.  Report a subjective improvement in accepting the disease process and its management.  Attend Emotional Health and Wellbeing education     Psychosocial Interventions/Education  Offer a variety of learning methods, including reading, note-taking, watching videos, discussing topics with others, hands-on experience, and interactive online resources.  Review screening tool results with the patient and notify the stress management counselor and physician if the PHQ-9 score is 10 or greater.  Engage the patient in continuous assessment of their psychosocial status.  Provide information about support groups and health system/community resources.  Encourage group participation and sharing while respecting privacy.  Inform the patient that their spouse or support person may attend education sessions.  Train the patient in relaxation techniques and/or stress management strategies.  Discuss how regular exercise, good nutrition, and adequate sleep contribute to stress management.  Refer the patient to Mental Health or Social Services for one-on-one counseling if needed.  Refer the patient to their primary care provider (PCP) for prescription assistance if needed.  Provide information on Health Management and Education classes  Education on  Emotional Health and Wellbeing - coping techniques, relaxation techniques, breathing strategies to control dyspnea. To be completed within 16 sessions     30 Day Psychosocial Reassessment/Date: 11/20/22    PHQ-9 score: 1  GAD score: 0  Patient verbalizes/demonstrates management of anxiety/depression.  Patient is satisfied with current support system.     Interventions:  Informed patient that their spouse or support person may attend education sessions.  Discussed how regular exercise, good nutrition, and adequate sleep contribute to stress management.  Encouraged group participation and sharing while respecting privacy.  11/20/22 - Patient states he is enjoying the class and learning a lot.     Education: Emotional Health and Well-being: pending     Progress toward Goals/Plan: Showing progress  Continue to monitor PHQ9, GAD.   Reinforce tools to cope with SOB, anxiety and tools for stress management.     60 Day Psychosocial Reassessment/Date: 12/13/22   PHQ-9 score: P  GAD score: P  Patient verbalizes/demonstrates management of anxiety/depression.  Patient is satisfied with current support system.  Patient remains very positive and attends the educational sessions      Interventions:  Informed patient that their spouse or support person may attend education sessions.  Discussed how regular exercise, good nutrition, and adequate sleep contribute to stress management.  Provided information about support groups and health system/community resources.  Encouraged group participation and sharing while respecting privacy.     Education: Emotional Health and Well-being: completed 12/06/22     Progress toward Goals/Plan: PR program on hold  Continue to monitor PHQ9, GAD. Reinforce tools to cope with SOB, anxiety and tools for stress management.     60 Day Psychosocial Reassessment/Date: 01/06/23  PR program on hold. Patient plans to continue pending CT results.  Psychosocial Discharge/Follow-up/Date:   PHQ-9 score:  GAD score:      Patient verbalizes/demonstrates:  Acquired new skills to cope with shortness of breath (SOB) and anxiety.  Developed new stress management techniques.  Reports a subjective improvement in accepting the disease process and its management.  Adherent with appointments with mental health counselor, as needed    Adherent with appointments with PCP for medication assistance, as needed   Adherent with anti-anxiety medications/anti-depression medications  Education: Emotional Health and Well-being:     Future plans to maintain gains achieved during PR/ORS:  Health Management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 recommendations & resources   Education: Conservation of Energy - educate patient in pacing, pursed lip breathing and energy conservation techniques to improve ability to perform daily activities with less shortness of breath. To be completed within 16 sessions.      30 Day ADL Reassessment/Date: 11/20/22    [place] SOB: 39  CAT: 12  MMRC: 1  Patient shows an improvement in ability to perform exercise and daily activities     Interventions:  Patient provided education on effective breathing, pacing while on NS and with UBE  Patient proved education/demonstration of appropriate use of walking aids for safety - patient uses 4 wheel walker  Patient provided education on equipment safety and with balance exercises     Education:  Conservation of Energy: completed 11/15/22     Progress toward Goals/Plan: Showing progress  Continue to monitor patient progress with daily activity management with control of dyspnea.  Continue to monitor patient progress with safe participation in exercise class and use of exercise equipment.  Continue to monitor patient progress with balance exercises.     60 Day ADL Reassessment/Date: 12/13/22   [place] SOB: P  CAT: P  MMRC : P  Patient shows an improvement in ability to perform exercise and daily activities AT HOME. Patient states using exercised in [place] booklet.     Interventions:  Patient provided education on effective breathing, pacing      Progress toward Goals/Plan:PR program on hold  Continue to monitor patient progress with daily activity management with control of dyspnea.  Continue to monitor patient progress with safe participation in exercise class and use of exercise equipment.  Continue to monitor patient progress with balance exercises.     90 Day ADL Reassessment/Date: 01/06/23  PR program on hold. Patient plans to continue pending CT results.  ADL Discharge/Follow/Date:   [place] SOB:   CAT:  MMRC:      Patient verbalizes/demonstrates:  Proper use of energy conservation techniques with daily activities and exercise   Safe participation in exercise class and use of exercise equipment and with balance exercises.     Future plans to maintain gains achieved during PR/ORS:  Patient to continue practice of the 4 Ps: Planning, Posture, Pacing and PLB with ADLs and exercise  Patient to continue safe and appropriate use of walking aids  Patient to continue to practice safety with balance exercises      Progress toward Goals: Goals met; Goals not met     Supportive Medicine ASSESSMENT  Initial Supportive Medicine Assessment/Date: yes  Patient has completed advance directive and POLST forms: yes and no  Patient understands difference between hospice and palliative care: No  Patient has had a discussion with  health care provider most comfortable with: No     Palliative Care Plan  Palliative Care Goals  Identify patient symptoms to help manage them  Ensure patient is as comfortable and active as possible  Assist and support patient and family with difficult medical decisions  Coordinate care and treatment among doctors during all stages of illness  Identify services that can support patient and family after completing                Palliative Care Plan Interventions/Education  Refer to Supportive Medicine Clinic for consultation, if needed or requested  Education: Advance Care Planning - Discuss difference in hospice and palliative care, purpose of having an advance directive and POLST form  completed and importance of having a discussion with health care provider most comfortable with. To be completed within 16 sessions.     30 Day Supportive Medicine Reassessment/Date: 11/20/22    Patient has completed advance directive and POLST forms - not on file  Patient has had a discussion with health care provider most comfortable with - not at this time     Interventions:  Patient provided care coordination with patients healthcare team.  Patient provided education on respiratory symptoms and strategies to help manage them  Patient provided information on difference in hospice and palliative care  Patient provided information on our Supportive Medicine clinic     Education: Advance Care Planning: pending     Progress toward Goals/Plan: Showing progress, Goals met, Goals not met  Continue to monitor patient progress with managing symptoms.  Continue to coordinate care and treatment among patients healthcare team.  Continue to assist and support patient and family with difficult medical decisions referring to Supportive Medicine clinic.      60 Day Supportive Medicine Reassessment/Date: 12/13/22   Patient has not completed advance directive and POLST forms  Patient has not had a discussion with health care provider most comfortable  with     Interventions:  Patient provided care coordination with patients healthcare team.  Patient provided education on respiratory symptoms and strategies to help manage them     Education: Advance Care Planning: pending     Progress toward Goals/Plan: PR program on hold  Continue to monitor patient progress with managing symptoms.  Continue to coordinate care and treatment among patients healthcare team.  Continue to assist and support patient and family with difficult medical decisions referring to Supportive Medicine clinic.      90 Day Supportive Medicine Reassessment/Date: 01/06/23      PR program on hold. Patient plans to continue pending CT results.     Supportive Medicine Discharge/Final/Date:      Patient verbalizes/demonstrates:  Importance of having an advance directive and POLST forms  How to recognize worsening respiratory symptoms and strategies to help manage them  Understanding the difference in hospice and palliative care     Future plans to maintain gains achieved during PR/ORS:  Health Management and Education class on Advance Care Planning     Progress toward Goals: Goals met; Goals not met        Patient  Outcomes  Initial Assessment  Date: 10/31/2022 30 Day Reassessment        Date: 11/20/22   60 Day   Reassessment        Date: 12/13/22  90 Day Reassessment Date: 01/06/23 Discharge/Final           Date:                        Distance (meters)    120  Held until Discharge/Final Held until Discharge/Final Held until Discharge/Final     MPH 0.75           Max HR  119           Low SpO2   93% on RA            RPD 3           RPE 3           METS 1.5           Functional Fitness  Bicep Curls 13           Sit to Stand 10           Frail 8/22           Body Mass              Height (inches) 72.6           Weight (lbs.) 209   202  Program on hold Program on hold     BMI (kg/m2) 26.8  26  Program on hold Program on hold     Questionnaires              PHQ9  1  1 Program on hold Program  on hold     GAD7 1  0 Program on hold Program on hold     [place] SOB 60  39 Program on hold Program on hold     CAT (COPD & QoL) 21  12 Program on hold Program on hold     ACT  15  22 Program on hold Program on hold     STOP BANG 5           EPWORTH 5           MMRC 4 1   Program on hold Program on hold           Initial Assessment signed by  Felicity Pellegrini, MHAL, BA, RRT-NPS, RCP, CES  Date: 10/31/2022     Initial Assessment/Medical Director  This patient was seen, evaluated and care plan was developed with the Pulmonary Rehabilitation Coordinator. I agree with the assessment and plan as outlined in her note.  Electronically signed: 10-31-2022  Ranae Plumber. Hardin MD     30 Day Reassessment/ITP prepared and signed by:  Patient continues to need current level of care and guidance on exercise progression, breathing retraining, medications, nutrition and action plan.  Raistlin Gum, MHAL, BA, RRT-NPS, RCP, CES  Date: 11/21/22     30 Day Reassessment/Medical Director  This patient was seen, evaluated and care plan was developed with the Pulmonary Rehab coordinator. I agree with the assessment and plan as outlined in her note.   Report electronically signed by Waldon Reining, MD   Signature/Date: 11-20-2022     60 Day Reassessment/ITP prepared and signed by:  Patient PR program on hold at this time due to abdominal/bloating. Testing in process. Patient wants to continue to in PR porgram and will contact us when ready to return. Patient continues to need current level of care and guidance on exercise progression, nutrition 24 hour food log, action plan and 3-day self assessment, medications, sleep and ACP.  Jagjit Riner, MHAL, BA, RRT-NPS, RCP, CES  Date: 12/13/2022     60 Day Reassessment/Medical Director  This patient was seen, evaluated and care plan was developed with the Pulmonary Rehab coordinator. I agree with the assessment and plan as outlined in her note.   Report electronically signed by Waldon Reining, MD    Signature/Date: 12-16-2022     90 Day Reassessment/ITP prepared and signed by:  PR program on hold. Patient plans to continue pending CT results. Will follow up with patient early next week. Patient wants to continue to in PR porgram and will contact us when ready to return. Patient continues to need current level of care and guidance on exercise progression, nutrition 24 hour food log, action plan and 3-day self assessment, medications, sleep and ACP.  Giulianna Rocha, MHAL, BA, RRT-NPS,  RCP, CES  Date: 01/06/23     90 Day Reassessment/Medical Director  This patient was seen, evaluated and care plan was developed with the Pulmonary Rehab coordinator. I agree with the assessment and plan as outlined in her note.   Report electronically signed by Waldon Reining, MD   Signature/Date: 01-06-2023    Final ITP prepared and signed by:  Patient did not return to program. Multiple attempts to reach with no call back.  Daeron Carreno, MHAL, BA, RRT-NPS, RCP, CES, FAACVPR  Date: 02/05/23    Final Assessment/Medical Director  This patient was seen, evaluated and care plan was developed with the Pulmonary Rehab coordinator. I agree with the assessment and plan as outlined in her note.   Report electronically signed by Waldon Reining, MD  Signature/Date: 02-05-2023

## 2023-02-07 ENCOUNTER — Other Ambulatory Visit: Payer: Self-pay | Admitting: Family Medicine

## 2023-02-07 DIAGNOSIS — I10 Essential (primary) hypertension: Secondary | ICD-10-CM

## 2023-02-10 NOTE — Telephone Encounter (Signed)
Pharmacy Refill Optimization (PRO)    Per PRO Protocol, approval or denial will be determined by the provider     Bypassing Epic Protocol Determination (See Protocol Details for specific information)     Meets PRO P&T Approved Protocol: NO - Medication not reviewed/reconciled in past 12 months     Last reviewed during OV 10/22/21  ====================================================================    Medication name: LISINOPRIL  Labs (if required by protocol):   Lab Results   Component Value Date    NA 140 12/09/2022    NA 139 10/22/2021    K 4.7 12/09/2022    K 4.2 10/22/2021    CA 8.9 12/09/2022    CA 9.6 10/22/2021    CR 1.06 12/09/2022    CR 1.00 10/22/2021    GFR 73 12/09/2022    GFR 78 10/22/2021    BUN 13 12/09/2022    BUN 10 10/22/2021

## 2023-04-11 ENCOUNTER — Other Ambulatory Visit: Payer: Self-pay | Admitting: Family Medicine

## 2023-04-11 DIAGNOSIS — J42 Unspecified chronic bronchitis: Secondary | ICD-10-CM

## 2023-04-15 MED ORDER — SPIRIVA RESPIMAT 2.5 MCG/ACTUATION SOLUTION FOR INHALATION
2.0000 | Freq: Every day | RESPIRATORY_TRACT | 3 refills | Status: DC
Start: 2023-04-15 — End: 2024-01-29

## 2023-04-15 NOTE — Telephone Encounter (Signed)
Pharmacy Refill Optimization (PRO)    Per PRO Protocol, approval or denial will be determined by the provider     Bypassing Epic Protocol Determination (See Protocol Details for specific information)     Meets PRO P&T Approved Protocol: NO -   Medication not reviewed/reconciled in past 12 months   Last mentioned by PCP 10/22/21    ====================================================================    Medication name: Spiriva  Labs (if required by protocol): N/A

## 2023-04-23 ENCOUNTER — Other Ambulatory Visit: Payer: Self-pay | Admitting: Family Medicine

## 2023-04-23 DIAGNOSIS — J42 Unspecified chronic bronchitis: Secondary | ICD-10-CM

## 2023-04-26 NOTE — Telephone Encounter (Signed)
Pharmacy Refill Optimization (PRO)    Per PRO Protocol, approval or denial will be determined by the provider     Bypassing Epic Protocol Determination (See Protocol Details for specific information)     Meets PRO P&T Approved Protocol: NO - Medication not reviewed/reconciled in past 12 months      ====================================================================    Medication name: Albuterol-Ipratropium   Labs (if required by protocol): N/A

## 2023-04-27 MED ORDER — COMBIVENT RESPIMAT 20 MCG-100 MCG/ACTUATION SOLUTION FOR INHALATION
1.0000 | Freq: Four times a day (QID) | RESPIRATORY_TRACT | 1 refills | Status: AC
Start: 2023-04-27 — End: 2024-04-26

## 2023-04-30 ENCOUNTER — Other Ambulatory Visit: Payer: Self-pay | Admitting: Family Medicine

## 2023-04-30 DIAGNOSIS — K219 Gastro-esophageal reflux disease without esophagitis: Secondary | ICD-10-CM

## 2023-05-01 ENCOUNTER — Encounter: Payer: Self-pay | Admitting: Family Medicine

## 2023-05-01 ENCOUNTER — Other Ambulatory Visit: Payer: Self-pay | Admitting: Family Medicine

## 2023-05-01 DIAGNOSIS — J42 Unspecified chronic bronchitis: Secondary | ICD-10-CM

## 2023-05-01 MED ORDER — FAMOTIDINE 40 MG TABLET
40.0000 mg | ORAL_TABLET | Freq: Every day | ORAL | 1 refills | Status: DC
Start: 2023-05-01 — End: 2023-05-06

## 2023-05-01 NOTE — Telephone Encounter (Signed)
Pharmacy Refill Optimization (PRO)    Per PRO Protocol, approval or denial will be determined by the provider     Bypassing Epic Protocol Determination (See Protocol Details for specific information)     Meets PRO P&T Approved Protocol: NO - Medication not reviewed/reconciled in past 12 months   ====================================================================    Medication name: famotidine

## 2023-05-02 NOTE — Telephone Encounter (Signed)
Pharmacy Refill Optimization (PRO)    DUPLICATE REFILL REQUEST     Please refer to Patient Message on 05/01/2023    Refill Request denied as DUPLICATE per PRO Workflow 05/02/2023

## 2023-05-02 NOTE — Telephone Encounter (Signed)
Pharmacy Refill Optimization (PRO)    REFILL REQUEST ALREADY RESPONDED TO BY OTHER MEANS     Prescription renewal addressed on 04/27/23, #3 inhalers with 1 refill sent to [email or web address]    Contacted [email or web address], spoke to Queets. Cherly Hensen confirms prescription from 02/02 was received, but copay was $158.01. Pharmacy placed RX on file until patient confirms processing of copay.    Refill Request denied as DUPLICATE per PRO Workflow 05/02/2023    Patient informed via MyChart.

## 2023-05-03 ENCOUNTER — Other Ambulatory Visit: Payer: Self-pay | Admitting: Infectious Disease

## 2023-05-03 DIAGNOSIS — K219 Gastro-esophageal reflux disease without esophagitis: Secondary | ICD-10-CM

## 2023-05-05 ENCOUNTER — Other Ambulatory Visit: Payer: Self-pay | Admitting: Family Medicine

## 2023-05-05 DIAGNOSIS — J42 Unspecified chronic bronchitis: Secondary | ICD-10-CM

## 2023-05-05 NOTE — Telephone Encounter (Signed)
Pharmacy Refill Optimization (PRO)    REFILL REQUEST ALREADY RESPONDED TO BY OTHER MEANS     Prescription renewal addressed on 05/01/23, sent to   [email or web address] - [place], [place] - [address], [phone] [facility] [phone] FX     Refill Request denied as REFILL TOO SOON per PRO Workflow 05/05/2023

## 2023-05-06 ENCOUNTER — Other Ambulatory Visit: Payer: Self-pay | Admitting: Family Medicine

## 2023-05-06 DIAGNOSIS — K219 Gastro-esophageal reflux disease without esophagitis: Secondary | ICD-10-CM

## 2023-05-06 MED ORDER — FAMOTIDINE 40 MG TABLET
40.0000 mg | ORAL_TABLET | Freq: Every day | ORAL | 1 refills | Status: DC
Start: 2023-05-06 — End: 2023-08-09

## 2023-05-06 NOTE — Telephone Encounter (Signed)
Pharmacy Refill Optimization (PRO) - Refill Team    [facility] Kenneth Gordon. called patient to confirm he has received his Combivent (he did). Subsequently, he asked her to move his Famotidine prescription to his local retail [facility] instead of [facility].    **Request to re-route Famotidine prescription to Retail Pharmacy, [facility] [phone] based on patient request     Request has been pended to the pharmacist for review per PRO P&T Approved Protocol 05/06/2023

## 2023-05-06 NOTE — Telephone Encounter (Signed)
Pharmacy Refill Optimization (PRO) - Refill Team    Request to re-route prescription to Retail Pharmacy based on patient request   05/01/23 #90 + 1 refill sent to [email or web address] resend to [facility]  Refill authorized per P&T Protocol 05/06/2023

## 2023-05-06 NOTE — Telephone Encounter (Signed)
Pharmacy Refill Optimization (PRO)    DUPLICATE REFILL REQUEST   Prescription renewal addressed on 04/27/23, sent to   [facility] - [place], [place] - [address], [phone] [facility] [phone] FX   Filled 05/01/22 at [facility] [place] - confirmed pick up with patient    Refill Request denied as DUPLICATE per PRO Workflow 05/06/2023

## 2023-05-08 ENCOUNTER — Telehealth: Payer: Self-pay

## 2023-05-08 NOTE — Telephone Encounter (Signed)
A call was made to the patient with no answer and message left. Called to discuss the Pulmonary Rehabilitation patient survey with the patient.     Vallery Ridge, RN

## 2023-06-02 ENCOUNTER — Encounter: Payer: Self-pay | Admitting: Internal Medicine

## 2023-06-02 NOTE — Progress Notes (Unsigned)
 Submitted request to [facility] for Report from Dec 2024 - Current.    Future Appointments   Date Time Provider Department Center   07/01/2023 10:40 AM Carlyon Shadow, MD RESUMA INT MED [facility]        Alfonzo Beers  [facility])  [facility] III Pre-Visit Planner   Pulmonary Department    [phone]

## 2023-06-03 NOTE — Progress Notes (Unsigned)
 Patient has not scheduled Sleep Study at this time, please see message below.                Alfonzo Beers  [facility])  [facility] III Scientist, forensic   Pulmonary Department    [phone]

## 2023-06-13 ENCOUNTER — Telehealth: Payer: Self-pay | Admitting: Family Medicine

## 2023-06-13 NOTE — Telephone Encounter (Signed)
 Spriva inh is not covered by insurance Prior Kenneth Gordon is requested

## 2023-06-16 NOTE — Telephone Encounter (Signed)
 The [facility] PA Team received a request to obtain authorization from the patient's insurance for Spiriva Respimat 2.62mcg Inhaler. You can view its status in the "Referral/Authorizations" tab or through medication links in Storyboard.    If approved, we will attempt to notify the patient.  If denied, we will notify the clinic/provider with the reason. You will be responsible for discussing the care plan with the patient.    Thank you,  Engineer, maintenance III   [employer] Prior Authorization Lawyer)  Prior [employer]

## 2023-06-17 ENCOUNTER — Encounter: Payer: Self-pay | Admitting: Family Medicine

## 2023-06-17 NOTE — Telephone Encounter (Signed)
 Per chart review PA approved- see TE 06/13/23 and mychart message 06/17/23    Advised pt to follow up with pharmacy.

## 2023-06-17 NOTE — Telephone Encounter (Addendum)
 Prior authorization for Spiriva Respimat 2.40mcg Inhaler has been approved by [employer] from 03/26/2023 until 03/24/2024. Mychart message sent to patient to notify him about the PA approval.     No further action required    Thank you,  Gracilyn Gunia Dispensing optician III   [employer] Prior Authorization Lawyer)  Prior [employer]

## 2023-07-01 ENCOUNTER — Ambulatory Visit: Payer: Medicare HMO | Admitting: Internal Medicine

## 2023-07-21 ENCOUNTER — Telehealth: Payer: Self-pay | Admitting: Family Medicine

## 2023-07-21 NOTE — Telephone Encounter (Signed)
  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     Patient was contacted regarding Advanced Care Planning (ACP)    Refer to Flowsheet for outreach detail and information.    Please note if patient was contacted 3 patient identifiers were used.       Kathlene Paradise, MA   Quality Improvement Team   Outreach - [facility]

## 2023-08-09 ENCOUNTER — Other Ambulatory Visit: Payer: Self-pay | Admitting: Family Medicine

## 2023-08-09 ENCOUNTER — Encounter: Payer: Self-pay | Admitting: Family Medicine

## 2023-08-09 DIAGNOSIS — K219 Gastro-esophageal reflux disease without esophagitis: Secondary | ICD-10-CM

## 2023-08-09 DIAGNOSIS — I10 Essential (primary) hypertension: Secondary | ICD-10-CM

## 2023-08-10 NOTE — Telephone Encounter (Signed)
 Pharmacy Refill Optimization (PRO)    Per PRO workflow, approval or denial will be determined by the provider     Meets PRO CPA 690-00: NO - Medication has not been reviewed/reconciled in the past 12 months    See Protocol Details for additional information   ====================================================================    Medication name:   Requested Prescriptions     Pending Prescriptions Disp Refills    FamoTIDine  (PEPCID ) 40 mg Tablet 90 tablet 1     Sig: Take 1 tablet by mouth every day.     Labs (if required by protocol): N/A

## 2023-08-10 NOTE — Telephone Encounter (Signed)
 Pharmacy Refill Optimization (PRO)    Per PRO workflow, approval or denial will be determined by the provider     Meets PRO CPA 690-00: NO - Medication has not been reviewed/reconciled in the past 12 months    See Protocol Details for additional information   ====================================================================    Medication name:   Requested Prescriptions     Pending Prescriptions Disp Refills    Lisinopril  (PRINIVIL , ZESTRIL ) 30 mg tablet [Pharmacy Med Name: LISINOPRIL  30MG  TABLETS] 90 tablet 1     Sig: Take 1 tablet by mouth every day.     Labs (if required by protocol):   Lab Results   Component Value Date    NA 140 12/09/2022    NA 139 10/22/2021    K 4.7 12/09/2022    K 4.2 10/22/2021    CA 8.9 12/09/2022    CA 9.6 10/22/2021    CO2 25 12/09/2022    CO2 32 (H) 10/22/2021    CR 1.06 12/09/2022    CR 1.00 10/22/2021    GFR 73 12/09/2022    GFR 78 10/22/2021    BUN 13 12/09/2022    BUN 10 10/22/2021

## 2023-08-11 MED ORDER — FAMOTIDINE 40 MG TABLET
40.0000 mg | ORAL_TABLET | Freq: Every day | ORAL | 1 refills | Status: DC
Start: 2023-08-11 — End: 2023-12-03

## 2023-08-14 ENCOUNTER — Encounter: Payer: Self-pay | Admitting: Family Medicine

## 2023-08-14 DIAGNOSIS — J42 Unspecified chronic bronchitis: Secondary | ICD-10-CM

## 2023-08-15 MED ORDER — BUDESONIDE-FORMOTEROL HFA 160 MCG-4.5 MCG/ACTUATION AEROSOL INHALER
2.0000 | INHALATION_SPRAY | Freq: Two times a day (BID) | RESPIRATORY_TRACT | 3 refills | Status: AC
Start: 2023-08-15 — End: 2024-08-15

## 2023-08-15 NOTE — Telephone Encounter (Signed)
 Pharmacy Refill Optimization (PRO)    Refill authorized per PRO CPA 690-00 08/15/2023    Meets PRO CPA 690-00: YES    See Protocol Details for additional information   ====================================================================    Medication name:   Requested Prescriptions     Pending Prescriptions Disp Refills    Budesonide /Formoterol  (SYMBICORT ) 160-4.5 mcg/actuation Inhaler 30.6 g 3     Sig: Take 2 puffs by inhalation 2 times daily.     Labs (if required by protocol): N/A

## 2023-08-18 ENCOUNTER — Encounter: Payer: Self-pay | Admitting: Family Medicine

## 2023-08-18 DIAGNOSIS — J42 Unspecified chronic bronchitis: Secondary | ICD-10-CM

## 2023-08-20 MED ORDER — COMBIVENT RESPIMAT 20 MCG-100 MCG/ACTUATION SOLUTION FOR INHALATION
1.0000 | Freq: Four times a day (QID) | RESPIRATORY_TRACT | 1 refills | Status: DC
Start: 2023-08-20 — End: 2024-02-14

## 2023-08-20 NOTE — Telephone Encounter (Signed)
 Pharmacy Refill Optimization (PRO)    Refill authorized per PRO CPA 690-00 08/20/2023    Meets PRO CPA 690-00: YES    See Protocol Details for additional information   ====================================================================    Medication name:   Requested Prescriptions     Pending Prescriptions Disp Refills    Albuterol /Ipratropium (COMBIVENT  RESPIMAT) 20-100 mcg/actuation Inhaler 12 g 1     Sig: Take 1 puff by inhalation 4 times daily.     Labs (if required by protocol): N/A

## 2023-09-29 ENCOUNTER — Other Ambulatory Visit: Payer: Self-pay | Admitting: Internal Medicine

## 2023-10-01 NOTE — Telephone Encounter (Signed)
 Service: COPD- [place]    Last visit: 12/31/22    Background: -ctn maintenance flonase  qdaily     Future visit: none scheduled      Prescription refilled per RN prescription refill protocol.        Hadassah Roses, RN

## 2023-10-10 ENCOUNTER — Encounter: Payer: Self-pay | Admitting: Family Medicine

## 2023-10-10 DIAGNOSIS — J42 Unspecified chronic bronchitis: Secondary | ICD-10-CM

## 2023-10-13 NOTE — Telephone Encounter (Signed)
 Pharmacy Refill Optimization (PRO)    Refill request denied as REFILL TOO SOON per PRO workflow 10/13/2023    Prescription renewal addressed on 08/15/23 #30.6g + 3R, sent to [facility]    Patient informed via MyChart.

## 2023-10-13 NOTE — Telephone Encounter (Signed)
 Pharmacy Refill Optimization (PRO)    Refill request denied as REFILL TOO SOON per PRO workflow 10/13/2023    Prescription renewal addressed on 08/15/23, sent to [id number] Kenneth Gordon, [place] [address] [facility] [address] [phone] [facility] [phone] FX     08/15/23 RX written for #30.6g (90ds) + 3RF. Last filled #20.4g (60ds) on 08/16/23. Patient should have remaining refills.

## 2023-10-13 NOTE — Telephone Encounter (Signed)
 Kenneth Gordon is a 78yr old male  3 patient identifiers used.  Per:   Pharmacy.    Disposition: Rx Request Filled (overriding CALL PCP NOW)   Per:   Kenneth Gordon verbalizes agreement to plan.     See Assessment Below  Kenneth Gordon pharmacist at [facility]. States they did not receive the 08/15/2023 Rx for Symbicort . The last Rx they have was from November 2019. Relayed Rx information to pharmacist. Nothing further needed from nursing.      See Care Advice Below  Care Advice            Medication Question Kenneth Gordon Pal, RN Mon Oct 13, 2023 05:21 PM      Care Advice       CARE ADVICE given per Medication Question Call (Adult) guideline.                              See Protocol and Disposition Below      Reason for Disposition   [1] Prescription not at pharmacy AND [2] was prescribed by PCP recently (Exception: [facility] has access to EMR and prescription is recorded there. Go to Home Care and confirm for pharmacy.)    Protocols used: Medication Question Call-ADULT-AH      Kenneth Gordon Pal BSN, RN  PCN Triage

## 2023-10-14 ENCOUNTER — Telehealth: Payer: Self-pay | Admitting: Family Medicine

## 2023-12-02 ENCOUNTER — Encounter: Payer: Self-pay | Admitting: Family Medicine

## 2023-12-02 ENCOUNTER — Ambulatory Visit: Payer: Medicare HMO | Admitting: NURSE PRACTITIONER

## 2023-12-02 DIAGNOSIS — J441 Chronic obstructive pulmonary disease with (acute) exacerbation: Secondary | ICD-10-CM

## 2023-12-02 DIAGNOSIS — K5900 Constipation, unspecified: Secondary | ICD-10-CM

## 2023-12-02 MED ORDER — AZITHROMYCIN 500 MG TABLET
500.0000 mg | ORAL_TABLET | Freq: Every day | ORAL | 0 refills | Status: AC
Start: 2023-12-02 — End: 2023-12-05

## 2023-12-02 MED ORDER — PREDNISONE 20 MG TABLET
60.0000 mg | ORAL_TABLET | Freq: Every day | ORAL | 0 refills | Status: AC
Start: 2023-12-02 — End: 2023-12-07

## 2023-12-02 NOTE — Patient Instructions (Signed)
 Start prednisone  and azithromycin  asap  Follow up in office tomorrow at 120 pm. Please come 20 minutes early to complete chest xray.  ER precautions for worsening symptoms or if you are unable to make appointment.

## 2023-12-02 NOTE — Telephone Encounter (Signed)
 3 patient identifiers used.  Per:   patient.    Disposition: SEE HCP (OR PCP TRIAGE) WITHIN 4 HOURS   Appointment given per protocol  Patient has no transportation willing to do a video appointment to address his symptoms.     Date & Time  12/02/2023  3:20 PM Provider  Cleotilde Beverley Bruckner, NP Department  [facility]     Per:   patient verbalizes agreement to plan. Agrees to callback with any increase in symptoms/concerns or questions.    See Assessment Below  Patient states with any exertion he has shortness of breath and O2 levels drop to 90%.   Patient states has shortness of breath small exertion from desk to bathroom which is 15-20 steps. Patient is pausing while talking states he was walking and needs to catch his breath. Patient states he has noticed sx for the past week. Patient states O2 sats right now are 91%-93%. Patient states his belly has been getting bigger and not having regular BM's. Patient  states has BM's once a week. Patient states in the last 24hrs he has had 0. Patient has been more fatigued and naps more. Patient is talking in full sentences. Patient states shortness of breath depends with what he is doing. Patient also has some post nasal drip and states hard to cough up. Advised appointment and patient states he has no transportation until tomorrow. Informed patient should be seen within 4 hours and offered an appointment with NP Cleotilde via video.     See Care Advice Below  Care Advice            Breathing Difficulty-Adult-AH  CHRISTELLA Ok, RN Tue Dec 02, 2023 12:55 PM      Care Advice       SEE HCP (OR PCP TRIAGE) WITHIN 4 HOURS:  * IF OFFICE WILL BE OPEN: You need to be seen within the next 3 or 4 hours. Call your doctor (or NP/PA) now or as soon as the office opens.  * IF OFFICE WILL BE CLOSED AND NO PCP (PRIMARY CARE PROVIDER) SECOND-LEVEL TRIAGE: You need to be seen within the next 3 or 4 hours. A nearby Urgent Care Center [facility]) is often a good source of care. Another  choice is to go to the ED. Go sooner if you become worse.  * IF OFFICE WILL BE CLOSED AND PCP SECOND-LEVEL TRIAGE REQUIRED: You may need to be seen. Your doctor (or NP/PA) will want to talk with you to decide what's best. I'll page the on-call provider now. If you haven't heard from the provider (or me) within 30 minutes, call again. NOTE: If on-call provider can't be reached, send to Operating Room Services or ED.    NOTE TO TRIAGER:  * Use nurse judgment to select the most appropriate source of care.  * Consider both the urgency of the patient's symptoms AND what resources may be needed to evaluate and manage the patient.    SOURCES OF CARE:  * ED: Patients who may need surgery or hospital admission need to be sent to an ED. So do most patients with serious symptoms or complex medical problems.  * UCC: Some UCCs can manage patients who are stable and have less serious symptoms (e.g., minor illnesses and injuries). The triager must know the [facility] capabilities before sending a patient there. If unsure, call ahead.  * OFFICE: If patient sounds stable and not seriously ill, consult PCP (or follow your office policy) to see if patient  can be seen NOW in office.                            See Protocol and Disposition Below  Reason for Disposition   [1] MILD difficulty breathing (e.g., minimal/no SOB at rest, SOB with walking, pulse < 100) AND [2] NEW-onset or WORSE than normal   [1] Longstanding difficulty breathing (e.g., CHF, COPD, emphysema) AND [2] WORSE than normal    Additional Information   Negative: SEVERE difficulty breathing (e.g., struggling for each breath, speaks in single words)   Negative: Bluish (or gray) lips or face now   Negative: Difficult to awaken or acting confused (e.g., disoriented, slurred speech)   Negative: Passed out (e.g., fainted, lost consciousness, blacked out and was not responding)   Negative: Wheezing started suddenly after medicine, an allergic food or bee sting   Negative: Wheezing can be heard across  the room   Negative: [1] MODERATE difficulty breathing (e.g., speaks in phrases, SOB even at rest, pulse 100-120) AND [2] NEW-onset or WORSE than normal   Negative: Oxygen level (e.g., pulse oximetry) 90% or lower   Negative: Fever > 103 F (39.4 C)   Negative: [1] Fever > 101 F (38.3 C) AND [2] age > 60 years   Negative: [1] Fever > 100 F (37.8 C) AND [2] bedridden (e.g., CVA, chronic illness, recovering from surgery)   Negative: [1] Fever > 100 F (37.8 C) AND [2] diabetes mellitus or weak immune system (e.g., HIV positive, cancer chemo, splenectomy, organ transplant, chronic steroids)   Negative: [1] Periods where breathing stops and then resumes normally AND [2] bedridden (e.g., CVA)    Protocols used: Breathing Difficulty-Adult-AH

## 2023-12-02 NOTE — Progress Notes (Signed)
 Video Visit Note     I performed this clinical encounter utilizing a real time telehealth video connection between my location and the patient's location. The patient consented to receive this health care service by video.     Patient Location:  Other (Home)    Is the Medicare patient currently located in the [place] ?: Yes    Video call face-to-face discussion time:  15    I spent a total of 20 minutes today on this patient's visit.        Subjective   Patient ID: Kenneth Gordon is a 60yr male    C/O: copd exacerbation     HPI    Pt reports worsening shortness of breath over the past 2-3 weeks. Symptoms get worse with physical activity. He is taking his inhalers as precribed. He has not used the nebulizer. Denies cough or chest pain. No dizziness or lightheadedness. Feels fine at rest.   Also notices that his abdomen is bloated. He has not had a BM in 2 days. SOB worse when bending over. Feels that abdomin is tight and rounded but not painful.      Review of Systems   Constitutional:  Negative for chills and fever.   Respiratory:  Positive for shortness of breath (with physical activity). Negative for cough.    Cardiovascular:  Negative for chest pain and palpitations.   Gastrointestinal:  Positive for abdominal distention and constipation. Negative for abdominal pain.       Objective   Physical Exam  Able to speak in complete sentences. Skin tone normal for ethnicity. AOx3    Assessment & Plan  COPD exacerbation (HCC)  Assessment: pt reports symptoms consistent with COPD exacerbation. Difficult to completely assess due to limitations with VV. He is able to speak in full sentences, is alert and oriented, and has normal skin tone. He reports SpO2 dropping below 90 with activity. Recommended ER for workup but pt refused. Agreed to come be seen in office tomorrow. Abd bloating could be exacerbating symptoms    Plan: start medication asap  Follow up for in office visit at 120 tomorrow.   ER precautions for worsening  symptoms   Please arrive 20 minutes early to complete chest xray      Orders:    predniSONE  (Deltasone ) 20 mg tablet; Take 3 tablets by mouth every day for 5 days.    azithromycin  (Zithromax ) 500 mg tablet; Take 1 tablet by mouth every day for 3 days.    DX CHEST 2 VIEWS; Future    Constipation, unspecified constipation type  Assessment: present for about 1 year. CT scan abd/pelvis for a year ago unrevealing. Started taking Doculax yesterday.     Plan: assess further at appointment tomorrow                Please see patient instructions  PPE used by provider: surgical mask  Interpretor was not used.    Electronically signed by Beverley Pinal, NP  [facility], [facility] PCN    Note: This chart was created in part with Dragon Voice-recognition software. Every effort has been made to correct any errors in the voice-recognition / dictation, but some may have been missed. If there are any questions, please contact the author for clarification and revision if needed.  If you are a patient reading this medical note, please know this is intended for communication between your healthcare providers. If you have any questions about words, content, or medical plan, please contact me.  I have provided the patient with some or all of the the above mentioned but not limited to referrals, lab orders, imaging orders, medications as well as verbal instruction/education. It is now the patients responsibility to complete tasks/take medication as discussed in a timely matter. If actions are delayed for any reason, it is the patients responsibility to notify the provider so that further action can be taken.  Supervising Physician: Dr. Sarah Louie

## 2023-12-03 ENCOUNTER — Encounter: Payer: Self-pay | Admitting: NURSE PRACTITIONER

## 2023-12-03 ENCOUNTER — Ambulatory Visit (INDEPENDENT_AMBULATORY_CARE_PROVIDER_SITE_OTHER): Payer: Medicare HMO | Admitting: NURSE PRACTITIONER

## 2023-12-03 ENCOUNTER — Ambulatory Visit
Admission: RE | Admit: 2023-12-03 | Discharge: 2023-12-03 | Disposition: A | Discharge status: Home / Self Care | Payer: Medicare HMO | Source: Ambulatory Visit

## 2023-12-03 VITALS — BP 120/78 | HR 97 | Temp 98.2°F | Ht 73.0 in | Wt 211.9 lb

## 2023-12-03 DIAGNOSIS — J984 Other disorders of lung: Secondary | ICD-10-CM

## 2023-12-03 DIAGNOSIS — K219 Gastro-esophageal reflux disease without esophagitis: Secondary | ICD-10-CM

## 2023-12-03 DIAGNOSIS — R140 Abdominal distension (gaseous): Secondary | ICD-10-CM

## 2023-12-03 DIAGNOSIS — K5909 Other constipation: Secondary | ICD-10-CM

## 2023-12-03 DIAGNOSIS — J441 Chronic obstructive pulmonary disease with (acute) exacerbation: Secondary | ICD-10-CM

## 2023-12-03 MED ORDER — SIMETHICONE 80 MG CHEWABLE TABLET
80.0000 mg | CHEWABLE_TABLET | Freq: Four times a day (QID) | ORAL | 3 refills | Status: AC | PRN
Start: 2023-12-03 — End: 2024-11-27

## 2023-12-03 MED ORDER — FAMOTIDINE 40 MG TABLET
40.0000 mg | ORAL_TABLET | Freq: Every day | ORAL | 1 refills | Status: DC
Start: 2023-12-03 — End: 2023-12-08

## 2023-12-03 NOTE — Patient Instructions (Addendum)
 Constipation:  Increase your fluid intake.   Please take colace 100 mg twice a day (stool softener)  Miralax 1 cup twice a day as needed for constipation. Please mix in 4-8 oz of water, juice, or coffee.  Bisacodyl suppository: 1 suppository daily as needed for constipation.    Referral to GI. Expect to hear about your referral within the next 2 weeks. If you do not hear anything in that timeframe, please contact our office and inquire about your referral.   I also prescribed simethicone  and famotidine  for abdominal bloating.  Follow up with me in 5 days. You stated you would prefer a video visit but have agreed to come into the office if you do not feel well.     VISIT SUMMARY:    During your visit, we addressed your increased shortness of breath related to COPD, chronic constipation, and acid reflux. We also discussed general health maintenance, including vaccinations.    YOUR PLAN:    COPD WITH ACUTE EXACERBATION: You have been experiencing increased shortness of breath and low oxygen levels when walking.  -Start taking prednisone  for 5 days.  -Start taking azithromycin  for 3 days.  -Visit the ER if your symptoms worsen or do not improve.  -Schedule a follow-up appointment in 5 days via video, with an option for an in-person visit if symptoms persist.    CHRONIC CONSTIPATION WITH ABDOMINAL BLOATING AND DISTENSION: You have been experiencing chronic constipation with bloating and discomfort.  -Increase docusate to twice a day.  -Start taking Miralax twice a day as needed.  -Use Dulcolax suppository as needed.  -Increase your fluid intake and consume fruits like prunes.  -A referral to gastroenterology has been sent for further evaluation.  -Take simethicone  for gas relief.  -Take famotidine  for bloating.      GENERAL HEALTH MAINTENANCE: We discussed your vaccinations.  -You received the COVID-19 vaccine at the pharmacy.  -Obtain the flu vaccine from the pharmacy when it becomes available.

## 2023-12-03 NOTE — Nursing Note (Signed)
 Patient ID confirmed using 2 identifiers.    Vital signs taken, allergies verified, screened for pain, and med history taken.     Mammography screening for females between 12-77 years of age reviewed as appropriate.    Flu Vaccine status reviewed and updated as appropriate.       My Chart status reviewed and updated as appropriate.    I have asked the patient if they have received any medical treatment/consultations outside of the [facility] within the past 30 days.  The patient has indicated no to receiving services outside of the [facility] system.  If yes, I have requested information to ascertain these results.     Repeat BP no    PHQ2 declined    [employer], [place]

## 2023-12-03 NOTE — Progress Notes (Signed)
 Date of Service: 12/03/2023   Visit date not found Visit date not found   ASSESSMENT & PLAN     Assessment & Plan  COPD with acute exacerbation  Increased dyspnea with exertional desaturation. No pneumonia on chest x-ray. The patient has not yet started prescribed medications.   - Start prednisone  60 mh for 5 days.  - Start azithromycin  500 mg for 3 days.  - Recommend ER visit if symptoms worsen or do not improve.  - Schedule follow-up in 5 days via video, with option for in-person if symptoms persist.    Chronic constipation with abdominal bloating and distension  Chronic constipation with bloating and distension. Previous imaging showed colonic diverticulosis. Denies history of colonoscopy.  - Increase docusate to twice a day.  - Start Miralax twice a day as needed.  - Use Dulcolax suppository as needed.  - Increase fluid intake and consume fruits like prunes.  - Send referral to gastroenterology for further evaluation.  - Prescribe simethicone  for gas relief.  - Prescribe famotidine  for bloating.    General Health Maintenance  Discussed COVID-19 vaccination. Received COVID-19 vaccine at pharmacy.  - Recommend obtaining flu vaccine from pharmacy when available.      ICD-10-CM    1. COPD exacerbation (HCC)  J44.1       2. Chronic constipation  K59.09 GASTROENTEROLOGY REFERRAL     Comprehensive Metabolic Panel     CBC with Differential      3. GERD (gastroesophageal reflux disease)  K21.9 famoTIDine  (Pepcid ) 40 mg tablet      4. Abdominal bloating  R14.0 famoTIDine  (Pepcid ) 40 mg tablet     simethicone  (Gas-X) 80 mg chewable tablet          Modified Medications    Modified Medication Previous Medication    FAMOTIDINE  (PEPCID ) 40 MG TABLET FamoTIDine  (PEPCID ) 40 mg Tablet       Take 1 tablet by mouth every day.    Take 1 tablet by mouth every day.        SUBJECTIVE   I obtained verbal consent from the patient or the patient's representative and any other participants to use AI ambient technology to transcribe  the interactions between the patient and myself during the clinical encounter. I read and reviewed the draft note.        History of Present Illness  Kenneth Gordon is a 78 year old male with COPD who presents with increased shortness of breath over the past three weeks.    He has experienced increased shortness of breath over the past three weeks, with oxygen levels dropping when walking and exacerbated breathing difficulties when bending over. He has a history of COPD and uses inhalers as prescribed, including Spiriva , Combivent , and Symbicort . He has not yet started the new medications prescribed to him, which include azithromycin  and prednisone .    He has been experiencing constipation for over a year, with episodes of discomfort and bloating. The constipation sometimes resolves for a week or two before returning. He recently had a bowel movement after taking a stool softener, which provided some relief, but he still feels bloated. He uses Dulcolax and a stool softener for management. A CT abdomen pelvis a year ago showed colonic diverticulosis without diverticulitis.    He denies any history of colon polyps and reports being a past smoker, having quit over five years ago. He experiences excessive gas at times, which provides relief when passed. He has gained weight, currently weighing 211 pounds, up  from 200 pounds at his last check. No coughing, productive cough, or black tarry stools.      I did review patient's past medical and family/social history, no changes noted.  Additional history obtained from: Dr. Saundra note dated9/19/25            OBJECTIVE     Vitals:    12/03/23 1322   BP: 120/78   SITE: right arm   Orthostatic Position: sitting   Cuff Size: large   Pulse: 97   Temp: 36.8 C (98.2 F)   TempSrc: Temporal   SpO2: 93%   Weight: 96.1 kg (211 lb 13.8 oz)   Height: 1.854 m (6' 1)     Body mass index is 27.95 kg/m.    Exam as captured by ambient recording:   Physical Exam  VITALS: SaO2-  93%  MEASUREMENTS: Weight- 211.  CHEST: Lungs diminished, hyperinflated, no wheeze.  ABDOMEN: Abdomen tender, tight, not painful.    Additional Exam (manually entered through NoteWriter):  Physical Exam  Vitals reviewed.   Constitutional:       Appearance: Normal appearance.   Cardiovascular:      Rate and Rhythm: Normal rate and regular rhythm.      Heart sounds: No murmur heard.  Pulmonary:      Effort: Pulmonary effort is normal. No tachypnea, accessory muscle usage or respiratory distress.      Breath sounds: Decreased breath sounds (bases) present. No wheezing, rhonchi or rales.      Comments: Lung sounds clear  Abdominal:      General: Bowel sounds are normal. There is distension.      Tenderness: There is generalized abdominal tenderness (mild).   Neurological:      Mental Status: He is alert and oriented to person, place, and time.           Please see patient instructions  PPE used by provider: surgical mask  Interpretor was not used.    Electronically signed by Beverley Pinal, NP  [facility], [facility] PCN    Note: This chart was created in part with Dragon Voice-recognition software. Every effort has been made to correct any errors in the voice-recognition / dictation, but some may have been missed. If there are any questions, please contact the author for clarification and revision if needed.  If you are a patient reading this medical note, please know this is intended for communication between your healthcare providers. If you have any questions about words, content, or medical plan, please contact me.  I have provided the patient with some or all of the the above mentioned but not limited to referrals, lab orders, imaging orders, medications as well as verbal instruction/education. It is now the patients responsibility to complete tasks/take medication as discussed in a timely matter. If actions are delayed for any reason, it is the patients responsibility to notify the provider so that further  action can be taken.  Supervising Physician: Dr. Sarah Louie

## 2023-12-04 ENCOUNTER — Other Ambulatory Visit: Payer: Self-pay | Admitting: Family Medicine

## 2023-12-04 DIAGNOSIS — E782 Mixed hyperlipidemia: Secondary | ICD-10-CM

## 2023-12-05 NOTE — Telephone Encounter (Signed)
 Pharmacy Refill Optimization (PRO)    Refill authorized per PRO CPA 690-00 12/05/2023    Meets PRO CPA [id number]: YES    See Protocol Details for additional information   ====================================================================    Medication name:   Requested Prescriptions     Pending Prescriptions Disp Refills    rosuvastatin  (Crestor ) 20 mg tablet [Pharmacy Med Name: ROSUVASTATIN  20MG  TABLETS] 90 tablet 3     Sig: TAKE 1 TABLET BY MOUTH EVERY DAY FOR CHOLESTEROL   12/12/22 will initiate Crestor .   Labs (if required by protocol):   Lab Results   Component Value Date    CHOL 137 12/09/2022    CHOL 128 10/22/2021    LDLC 74 12/09/2022    LDLC 61 10/22/2021    HDL 50 12/09/2022    HDL 54 10/22/2021    TRIG 63 12/09/2022    TRIG 64 10/22/2021

## 2023-12-08 ENCOUNTER — Ambulatory Visit: Payer: Medicare HMO | Admitting: NURSE PRACTITIONER

## 2023-12-08 ENCOUNTER — Encounter: Payer: Self-pay | Admitting: Family Medicine

## 2023-12-08 DIAGNOSIS — Z7189 Other specified counseling: Secondary | ICD-10-CM

## 2023-12-08 DIAGNOSIS — K219 Gastro-esophageal reflux disease without esophagitis: Secondary | ICD-10-CM

## 2023-12-08 DIAGNOSIS — J441 Chronic obstructive pulmonary disease with (acute) exacerbation: Secondary | ICD-10-CM

## 2023-12-08 DIAGNOSIS — J410 Simple chronic bronchitis: Secondary | ICD-10-CM

## 2023-12-08 DIAGNOSIS — K5909 Other constipation: Secondary | ICD-10-CM

## 2023-12-08 MED ORDER — CIMETIDINE 300 MG TABLET: 300.0000 mg | ORAL_TABLET | Freq: Two times a day (BID) | ORAL | 3 refills | Status: AC | PRN

## 2023-12-08 NOTE — Patient Instructions (Signed)
 VISIT SUMMARY:    During your visit, we discussed your improved shortness of breath, concerns about medication side effects, and recent constipation issues. We also reviewed your current medications and made adjustments to address your concerns.    YOUR PLAN:    CHRONIC OBSTRUCTIVE PULMONARY DISEASE (COPD): Your recent COPD exacerbation has improved after taking prednisone  and antibiotics.   -continue to take your inhalers as prescribed.  -Follow up with pulmonology    CHRONIC CONSTIPATION: Your constipation improved after stopping famotidine , but there is a concern it may return if you resume the medication.  -We have prescribed an alternative medication in the same drug class as famotidine . Please monitor for any recurrence of constipation and report if symptoms persist.    GASTROESOPHAGEAL REFLUX DISEASE (GERD): Your GERD has been managed with famotidine , but it may be causing constipation.  -We have prescribed an alternative medication in the same drug class as famotidine  to manage your GERD without causing constipation.

## 2023-12-08 NOTE — Progress Notes (Signed)
 Video Visit Note     I performed this clinical encounter utilizing a real time telehealth video connection between my location and the patient's location. The patient consented to receive this health care service by video.     Patient Location:  Other (Home)    Is the Medicare patient currently located in the [place] ?: Yes    Video call face-to-face discussion time:  10    I spent a total of 13 minutes today on this patient's visit.            Date of Service: 12/08/2023   12/03/2023 Visit date not found   ASSESSMENT & PLAN     Assessment & Plan  Chronic obstructive pulmonary disease with recent acute exacerbation  Recent exacerbation improved post prednisone  and antibiotics.   - Continue with inhalers as prescribed.  -Follow up with pulmonology    Chronic constipation  Constipation improved after stopping famotidine . Possible recurrence with resumption.  - Prescribe alternative medication in the same drug class as famotidine  (cimetidine ).  - Monitor for constipation recurrence with new medication and report if symptoms persist.    Gastroesophageal reflux disease  GERD managed with famotidine . Constipation linked to famotidine .  - Prescribe cimetidine     ACP  There are no ACP documents in / surrogate the EHR. The patient was given information about how to create and file ACP documents and Surrogate Decision Maker was confirmed and updated in ACP Activity      ICD-10-CM    1. COPD exacerbation (HCC)  J44.1       2. Chronic constipation  K59.09       3. Gastroesophageal reflux disease without esophagitis  K21.9 cimetidine  (Tagamet ) 300 mg tablet      4. ACP (advance care planning)  Z71.89                SUBJECTIVE   I obtained verbal consent from the patient or the patient's representative and any other participants to use AI ambient technology to transcribe the interactions between the patient and myself during the clinical encounter. I read and reviewed the draft note.        History of Present Illness  Kenneth Gordon is a 78 year old male who presents for follow up from COPD exacerbation and concerns about medication side effects.    His shortness of breath has improved following a course of prednisone  and azithromycin . He monitors his oxygen levels at home, which have been around 93%.    He has been taking famotidine  for acid reflux for years and has had chronic constipation, which he attributes to the medication. After stopping famotidine  for three days, his bowel movements returned to normal, and he experienced significant gas release, which he found relieving. He is concerned that resuming famotidine  may cause constipation again.    He has not yet completed the blood tests ordered previously due to scheduling conflicts with his son, who assists him with transportation. His son, Lonni, is also listed as his emergency contact.                OBJECTIVE   There were no vitals filed for this visit.  There is no height or weight on file to calculate BMI.    Exam as captured by ambient recording:   Physical Exam  Able to speak in complete sentences. Skin tone normal for ethnicity. AOx3      Additional Exam (manually entered through NoteWriter):  Physical Exam  Please see patient instructions  PPE used by provider: surgical mask  Interpretor was not used.    Electronically signed by Beverley Pinal, NP  [facility], [facility] PCN    Note: This chart was created in part with Dragon Voice-recognition software. Every effort has been made to correct any errors in the voice-recognition / dictation, but some may have been missed. If there are any questions, please contact the author for clarification and revision if needed.  If you are a patient reading this medical note, please know this is intended for communication between your healthcare providers. If you have any questions about words, content, or medical plan, please contact me.  I have provided the patient with some or all of the the above mentioned but not  limited to referrals, lab orders, imaging orders, medications as well as verbal instruction/education. It is now the patients responsibility to complete tasks/take medication as discussed in a timely matter. If actions are delayed for any reason, it is the patients responsibility to notify the provider so that further action can be taken.  Supervising Physician: Dr. Sarah Louie

## 2023-12-09 MED ORDER — ALBUTEROL SULFATE 1.25 MG/3 ML SOLUTION FOR NEBULIZATION
1.2500 mg | INHALATION_SOLUTION | Freq: Four times a day (QID) | RESPIRATORY_TRACT | 1 refills | Status: AC | PRN
Start: 2023-12-09 — End: 2024-12-08

## 2023-12-09 NOTE — Telephone Encounter (Signed)
 Pharmacy Refill Optimization (PRO)    Per PRO workflow, approval or denial will be determined by the provider     Meets PRO CPA 690-00: NO   Medication has not been reviewed/reconciled in the past 12 months  Last prescribed 06/05/2020    See Protocol Details for additional information   ====================================================================    Medication name:   Requested Prescriptions     Pending Prescriptions Disp Refills    albuterol  (Accuneb ) 1.25 mg/3 mL nebulizer solution 75 mL 5     Sig: Use 3 mL in nebulizer 4 times daily if needed.     Labs (if required by protocol): N/A

## 2024-01-28 ENCOUNTER — Other Ambulatory Visit: Payer: Self-pay | Admitting: Student in an Organized Health Care Education/Training Program

## 2024-01-28 DIAGNOSIS — J42 Unspecified chronic bronchitis: Secondary | ICD-10-CM

## 2024-01-29 NOTE — Telephone Encounter (Signed)
 Pharmacy Refill Optimization (PRO)    Refill authorized per PRO CPA 690-00 01/29/2024    Meets PRO CPA 690-00: YES    See Protocol Details for additional information   ====================================================================    Medication name:   Requested Prescriptions     Pending Prescriptions Disp Refills    Spiriva  Respimat 2.5 mcg/actuation inhaler [Pharmacy Med Name: SPIRIVA  RESPIMAT 2.5MCG INH 4GM 60D] 12 g 3     Sig: SHAKE LIQUID WELL AND INHALE 2 SPRAYS BY MOUTH VIA HANDIHALER DAILY. PLEASE KEEP AN APPOINTMENT IN CLINIC PRIOR TO FURTHER REFILLS     12/03/23 reviewed.

## 2024-02-04 ENCOUNTER — Other Ambulatory Visit: Payer: Self-pay | Admitting: Family Medicine

## 2024-02-04 DIAGNOSIS — I10 Essential (primary) hypertension: Secondary | ICD-10-CM

## 2024-02-05 NOTE — Telephone Encounter (Signed)
 Pharmacy Refill Optimization (PRO)    Per PRO workflow, approval or denial will be determined by the provider     Meets PRO CPA 690-00: NO -   Medication has not been reviewed/reconciled in the past 12 months  Lab values are overdue: Chemistry Panel not drawn within 12 months     See Protocol Details for additional information   ====================================================================    Medication name:   Requested Prescriptions     Pending Prescriptions Disp Refills    lisinopril  (Prinivil , Zestril ) 30 mg tablet [Pharmacy Med Name: LISINOPRIL  30MG  TABLETS] 90 tablet 1     Sig: Take 1 tablet by mouth every day.     Labs (if required by protocol):   Lab Results   Component Value Date    NA 140 12/09/2022    NA 139 10/22/2021    K 4.7 12/09/2022    K 4.2 10/22/2021    CA 8.9 12/09/2022    CA 9.6 10/22/2021    CO2 25 12/09/2022    CO2 32 (H) 10/22/2021    CR 1.06 12/09/2022    CR 1.00 10/22/2021    GFR 73 12/09/2022    GFR 78 10/22/2021    BUN 13 12/09/2022    BUN 10 10/22/2021

## 2024-02-14 ENCOUNTER — Encounter: Payer: Self-pay | Admitting: Family Medicine

## 2024-02-14 ENCOUNTER — Other Ambulatory Visit: Payer: Self-pay | Admitting: Family Medicine

## 2024-02-14 DIAGNOSIS — J42 Unspecified chronic bronchitis: Secondary | ICD-10-CM

## 2024-02-16 MED ORDER — COMBIVENT RESPIMAT 20 MCG-100 MCG/ACTUATION SOLUTION FOR INHALATION
1.0000 | Freq: Four times a day (QID) | RESPIRATORY_TRACT | 1 refills | Status: AC
Start: 2024-02-16 — End: 2025-02-15

## 2024-02-16 NOTE — Telephone Encounter (Signed)
 Pharmacy Refill Optimization (PRO)    Refill authorized per PRO CPA 690-00 02/16/2024    Meets PRO CPA 690-00: YES    See Protocol Details for additional information   ====================================================================    Medication name:   Requested Prescriptions     Pending Prescriptions Disp Refills    albuterol /ipratropium (Combivent  Respimat) 20-100 mcg/actuation inhaler 12 g 1     Sig: Take 1 puff by inhalation 4 times daily.   12/08/23 visit    Labs (if required by protocol): N/A

## 2024-02-16 NOTE — Telephone Encounter (Signed)
 Pharmacy Refill Optimization (PRO)    Refill request already responded to by other means per PRO workflow 02/16/2024    Prescription renewal addressed on 02/16/24 12g + 1 refills , sent to [facility] [phone]

## 2024-02-24 ENCOUNTER — Ambulatory Visit: Payer: Medicare HMO | Admitting: Family Medicine

## 2024-04-15 ENCOUNTER — Telehealth: Payer: Self-pay | Admitting: Family Medicine

## 2024-04-15 NOTE — Telephone Encounter (Signed)
 Prior Authorization History  Tiotropium (SPIRIVA  RESPIMAT) 2.5 mcg/actuation Inhaler     Approval Details    Authorization number: [id number]  Authorized from March 26, 2023 to March 24, 2024    Please re new PA.

## 2024-04-15 NOTE — Telephone Encounter (Signed)
 Medication Change:    Requesting a change in the prescription for   tiotropium (Spiriva  Respimat) 2.5 mcg/actuation inhaler .  The current dose is  2.5 mcg/actuation inhaler .   and the instructions are Take 2 sprays by inhalation every day. .  The reason for changing the medication is because Kayla from [facility] called in to ask for Dr.Young to prescribe Incruse Ellipta 62.5 mcg inhaler. tiotropium (Spiriva  Respimat) is not covered by the patients insurance. The patient requests Send to preferred pharmacy.    [id number], [place] - [id number] AT [facility] [address] [phone] [facility] [employer] [phone] FX     Sharlyn Seip

## 2024-04-16 ENCOUNTER — Other Ambulatory Visit: Payer: Self-pay

## 2024-04-16 NOTE — Telephone Encounter (Signed)
 We received a request to obtain Prior Authorization (PA) from the patient's insurance plan for Spiriva Respimat 2.5MCG/ACT aerosol. The status can be viewed in the "Referral/Authorizations" tab or through the Storyboard.  If approved: Our team will attempt to notify the patient.  If denied: Our team will inform the clinic/provider of the reason after relevant follow-up actions are completed. The clinic/provider is responsible for discussing the care plan with the patient.     -[facility] PA Team
